# Patient Record
Sex: Female | Born: 1955 | Race: Black or African American | Hispanic: No | Marital: Married | Smoking: Never smoker
Health system: Southern US, Community
[De-identification: ages and names within clinical notes are randomized; demographics above are authoritative.]

## PROBLEM LIST (undated history)

## (undated) DIAGNOSIS — IMO0001 Reserved for inherently not codable concepts without codable children: Secondary | ICD-10-CM

## (undated) DIAGNOSIS — R51 Headache: Secondary | ICD-10-CM

## (undated) DIAGNOSIS — C801 Malignant (primary) neoplasm, unspecified: Secondary | ICD-10-CM

## (undated) DIAGNOSIS — Z923 Personal history of irradiation: Secondary | ICD-10-CM

## (undated) DIAGNOSIS — C7951 Secondary malignant neoplasm of bone: Secondary | ICD-10-CM

## (undated) DIAGNOSIS — R519 Headache, unspecified: Secondary | ICD-10-CM

## (undated) DIAGNOSIS — K5900 Constipation, unspecified: Secondary | ICD-10-CM

## (undated) DIAGNOSIS — C50919 Malignant neoplasm of unspecified site of unspecified female breast: Secondary | ICD-10-CM

## (undated) DIAGNOSIS — C419 Malignant neoplasm of bone and articular cartilage, unspecified: Secondary | ICD-10-CM

## (undated) DIAGNOSIS — Z9221 Personal history of antineoplastic chemotherapy: Secondary | ICD-10-CM

## (undated) HISTORY — PX: FINGER SURGERY: SHX640

---

## 1986-04-07 HISTORY — PX: ABDOMINAL HYSTERECTOMY: SHX81

## 2001-04-08 ENCOUNTER — Emergency Department (HOSPITAL_COMMUNITY): Admission: EM | Admit: 2001-04-08 | Discharge: 2001-04-08 | Payer: Self-pay | Admitting: *Deleted

## 2001-08-20 ENCOUNTER — Emergency Department (HOSPITAL_COMMUNITY): Admission: EM | Admit: 2001-08-20 | Discharge: 2001-08-20 | Payer: Self-pay | Admitting: Emergency Medicine

## 2005-12-05 ENCOUNTER — Emergency Department (HOSPITAL_COMMUNITY): Admission: EM | Admit: 2005-12-05 | Discharge: 2005-12-05 | Payer: Self-pay | Admitting: Emergency Medicine

## 2012-11-30 ENCOUNTER — Ambulatory Visit (INDEPENDENT_AMBULATORY_CARE_PROVIDER_SITE_OTHER): Payer: 59 | Admitting: Emergency Medicine

## 2012-11-30 VITALS — BP 122/78 | HR 64 | Temp 98.3°F | Resp 18 | Ht 65.0 in | Wt 198.8 lb

## 2012-11-30 DIAGNOSIS — M67919 Unspecified disorder of synovium and tendon, unspecified shoulder: Secondary | ICD-10-CM

## 2012-11-30 DIAGNOSIS — M7552 Bursitis of left shoulder: Secondary | ICD-10-CM

## 2012-11-30 MED ORDER — MELOXICAM 15 MG PO TABS: 15.0000 mg | ORAL_TABLET | Freq: Every day | ORAL | Status: DC

## 2012-11-30 NOTE — Progress Notes (Signed)
[facility] [address] [place] [address] [phone]  Date:  11/30/2012   Name:  Karen Douglas   DOB:  11-20-1955   MRN:  [id number]  PCP:  No primary provider on file.    Chief Complaint: Arm Pain   History of Present Illness:  Karen Douglas is a 57 y.o. very pleasant female patient who presents with the following:  No history of injury.  Has pain in anterior right shoulder since May.  Pain worse at night, unable to sleep on right side at night. No neuro symptoms or radiation of pain.  No improvement with over the counter medications or other home remedies. Denies other complaint or health concern today.   There are no active problems to display for this patient.   History reviewed. No pertinent past medical history.  Past Surgical History  Procedure Laterality Date  . Abdominal hysterectomy      History  Substance Use Topics  . Smoking status: Never Smoker   . Smokeless tobacco: Not on file  . Alcohol Use: No    History reviewed. No pertinent family history.  No Known Allergies  Medication list has been reviewed and updated.  No current outpatient prescriptions on file prior to visit.   No current facility-administered medications on file prior to visit.    Review of Systems:  As per HPI, otherwise negative.    Physical Examination: Filed Vitals:   11/30/12 0855  BP: 122/78  Pulse: 64  Temp: 98.3 F (36.8 C)  Resp: 18   Filed Vitals:   11/30/12 0855  Height: 5\' 5"  (1.651 m)  Weight: 198 lb 12.8 oz (90.175 kg)   Body mass index is 33.08 kg/(m^2). Ideal Body Weight: Weight in (lb) to have BMI = 25: 149.9   GEN: WDWN, NAD, Non-toxic, Alert & Oriented x 3 HEENT: Atraumatic, Normocephalic.  Ears and Nose: No external deformity. EXTR: No clubbing/cyanosis/edema NEURO: Normal gait.  PSYCH: Normally interactive. Conversant. Not depressed or anxious appearing.  Calm demeanor.  RIGHT shoulder:  Tender anterior  shoulder.  Full ROM NATI  Assessment and Plan: Shoulder bursitis mobic Declined xray  Signed,  Phillips Odor, MD

## 2013-01-26 ENCOUNTER — Emergency Department (HOSPITAL_COMMUNITY)
Admission: EM | Admit: 2013-01-26 | Discharge: 2013-01-26 | Disposition: A | Discharge status: Home / Self Care | Payer: 59 | Attending: Emergency Medicine | Admitting: Emergency Medicine

## 2013-01-26 ENCOUNTER — Emergency Department (HOSPITAL_COMMUNITY): Payer: 59

## 2013-01-26 ENCOUNTER — Encounter (HOSPITAL_COMMUNITY): Payer: Self-pay | Admitting: Emergency Medicine

## 2013-01-26 DIAGNOSIS — K029 Dental caries, unspecified: Secondary | ICD-10-CM | POA: Insufficient documentation

## 2013-01-26 DIAGNOSIS — R112 Nausea with vomiting, unspecified: Secondary | ICD-10-CM | POA: Insufficient documentation

## 2013-01-26 DIAGNOSIS — K0889 Other specified disorders of teeth and supporting structures: Secondary | ICD-10-CM

## 2013-01-26 DIAGNOSIS — K089 Disorder of teeth and supporting structures, unspecified: Secondary | ICD-10-CM | POA: Insufficient documentation

## 2013-01-26 DIAGNOSIS — R51 Headache: Secondary | ICD-10-CM | POA: Insufficient documentation

## 2013-01-26 HISTORY — DX: Headache: R51

## 2013-01-26 HISTORY — DX: Headache, unspecified: R51.9

## 2013-01-26 MED ORDER — DIPHENHYDRAMINE HCL 50 MG/ML IJ SOLN
25.0000 mg | Freq: Once | INTRAMUSCULAR | Status: AC
  Administered 2013-01-26: 25 mg via INTRAVENOUS
  Filled 2013-01-26: qty 1

## 2013-01-26 MED ORDER — METOCLOPRAMIDE HCL 5 MG/ML IJ SOLN
10.0000 mg | Freq: Once | INTRAMUSCULAR | Status: AC
  Administered 2013-01-26: 10 mg via INTRAVENOUS
  Filled 2013-01-26: qty 2

## 2013-01-26 MED ORDER — DEXAMETHASONE SODIUM PHOSPHATE 10 MG/ML IJ SOLN
10.0000 mg | Freq: Once | INTRAMUSCULAR | Status: AC
  Administered 2013-01-26: 10 mg via INTRAVENOUS
  Filled 2013-01-26: qty 1

## 2013-01-26 MED ORDER — SODIUM CHLORIDE 0.9 % IV BOLUS (SEPSIS)
1000.0000 mL | Freq: Once | INTRAVENOUS | Status: AC
  Administered 2013-01-26: 1000 mL via INTRAVENOUS

## 2013-01-26 NOTE — ED Provider Notes (Signed)
CSN: [id number]     Arrival date & time 01/26/13  1921 History   First MD Initiated Contact with Patient 01/26/13 2054     Chief Complaint  Patient presents with  . Headache   (Consider location/radiation/quality/duration/timing/severity/associated sxs/prior Treatment) Patient is a 57 y.o. female presenting with headaches. The history is provided by the patient. No language interpreter was used.  Headache Pain location:  Frontal Quality:  Dull Radiates to: L jaw. Pain severity now: moderate. Pain scale at highest: severe. Onset quality:  Gradual Duration:  2 days Timing:  Intermittent Progression:  Waxing and waning Chronicity:  New Similar to prior headaches: yes (years ago had migraines)   Relieved by:  Nothing Worsened by:  Light Ineffective treatments:  NSAIDs Associated symptoms: facial pain, nausea and vomiting   Associated symptoms: no abdominal pain, no back pain, no congestion, no cough, no diarrhea, no dizziness, no fatigue, no fever, no focal weakness, no loss of balance, no neck pain, no neck stiffness, no numbness, no photophobia and no sore throat   Vomiting:    Quality:  Stomach contents   Number of occurrences:  2   Duration:  1 day   Progression:  Resolved   Past Medical History  Diagnosis Date  . Headache    Past Surgical History  Procedure Laterality Date  . Abdominal hysterectomy     History reviewed. No pertinent family history. History  Substance Use Topics  . Smoking status: Never Smoker   . Smokeless tobacco: Not on file  . Alcohol Use: No   OB History   Grav Para Term Preterm Abortions TAB SAB Ect Mult Living                 Review of Systems  Constitutional: Negative for fever, chills, diaphoresis, activity change, appetite change and fatigue.  HENT: Negative for congestion, facial swelling, rhinorrhea and sore throat.   Eyes: Negative for photophobia and discharge.  Respiratory: Negative for cough, chest tightness and shortness of  breath.   Cardiovascular: Negative for chest pain, palpitations and leg swelling.  Gastrointestinal: Positive for nausea and vomiting. Negative for abdominal pain and diarrhea.  Endocrine: Negative for polydipsia and polyuria.  Genitourinary: Negative for dysuria, frequency, difficulty urinating and pelvic pain.  Musculoskeletal: Negative for arthralgias, back pain, neck pain and neck stiffness.  Skin: Negative for color change and wound.  Allergic/Immunologic: Negative for immunocompromised state.  Neurological: Positive for headaches. Negative for dizziness, focal weakness, facial asymmetry, weakness, numbness and loss of balance.  Hematological: Does not bruise/bleed easily.  Psychiatric/Behavioral: Negative for confusion and agitation.    Allergies  Review of patient's allergies indicates no known allergies.  Home Medications   Current Outpatient Rx  Name  Route  Sig  Dispense  Refill  . amoxicillin (AMOXIL) 500 MG capsule   Oral   Take 500 mg by mouth 4 (four) times daily. For 7 days. Pt started on 01-25-13         . HYDROcodone-acetaminophen (NORCO/VICODIN) 5-325 MG per tablet   Oral   Take 1 tablet by mouth every 6 (six) hours as needed for pain.          BP 151/91  Pulse 87  Temp(Src) 97.5 F (36.4 C) (Oral)  Resp 18  Ht 5' (1.524 m)  Wt 195 lb 12.3 oz (88.8 kg)  BMI 38.23 kg/m2  SpO2 97% Physical Exam  Constitutional: She is oriented to person, place, and time. She appears well-developed and well-nourished. No distress.  HENT:  Head: Normocephalic and atraumatic.  Mouth/Throat: Abnormal dentition. Dental caries present. No oropharyngeal exudate.  Eyes: Pupils are equal, round, and reactive to light.  Neck: Normal range of motion. Neck supple.  Cardiovascular: Normal rate, regular rhythm and normal heart sounds.  Exam reveals no gallop and no friction rub.   No murmur heard. Pulmonary/Chest: Effort normal and breath sounds normal. No respiratory distress.  She has no wheezes. She has no rales.  Abdominal: Soft. Bowel sounds are normal. She exhibits no distension and no mass. There is no tenderness. There is no rebound and no guarding.  Musculoskeletal: Normal range of motion. She exhibits no edema and no tenderness.  Neurological: She is alert and oriented to person, place, and time. She has normal strength. She displays no tremor. No cranial nerve deficit or sensory deficit. She exhibits normal muscle tone. She displays a negative Romberg sign. Coordination normal. GCS eye subscore is 4. GCS verbal subscore is 5. GCS motor subscore is 6.  Skin: Skin is warm and dry.  Psychiatric: She has a normal mood and affect.    ED Course  Procedures (including critical care time) Labs Review Labs Reviewed - No data to display Imaging Review No results found.  EKG Interpretation   None       MDM  No diagnosis found. Pt is a 57 y.o. female with Pmhx as above who presents with several days of intermittent h/a along w/ waxing waning dental pain, today w/ several episodes of vomiting.  Pt being treated for dental abscess, is on amox.  VSS, pt in NAD. No focal neuro findings. Poor dentition. No focal neuro findings, no fever.  CT head unremarkable.  Pt felt somewhat improved after IV migraine cocktail.  I believe she is safe for d/c, dental pain likely driving factor for h/a's.  She will continue PO amox and follow closely w/ her dentist.  Return precautions given for new or worsening symptoms including worsening pain, fever         Shanna Cisco, MD 01/27/13 1204

## 2013-01-26 NOTE — ED Notes (Signed)
Presents with gradual onset of frontal lobe headache and pain behind eyes, associated with light sensitiviy and nausea. Began at 4 pm. Pt alert, oriented, answers all questions appropriately. Pt is concerned her tooth that is infected is in her blood stream.  Received antibiotics, amoxicillin for toothache and pain medication yesterday. History of headaches, this feels same. Denies dental pain. Denies fevers

## 2013-08-21 ENCOUNTER — Ambulatory Visit (INDEPENDENT_AMBULATORY_CARE_PROVIDER_SITE_OTHER): Payer: 59 | Admitting: Family Medicine

## 2013-08-21 ENCOUNTER — Ambulatory Visit: Payer: 59

## 2013-08-21 VITALS — BP 118/64 | HR 74 | Temp 97.5°F | Resp 15 | Ht 61.5 in | Wt 190.2 lb

## 2013-08-21 DIAGNOSIS — M7581 Other shoulder lesions, right shoulder: Principal | ICD-10-CM

## 2013-08-21 DIAGNOSIS — M542 Cervicalgia: Secondary | ICD-10-CM

## 2013-08-21 DIAGNOSIS — M719 Bursopathy, unspecified: Secondary | ICD-10-CM

## 2013-08-21 DIAGNOSIS — M778 Other enthesopathies, not elsewhere classified: Secondary | ICD-10-CM

## 2013-08-21 DIAGNOSIS — M67919 Unspecified disorder of synovium and tendon, unspecified shoulder: Secondary | ICD-10-CM

## 2013-08-21 MED ORDER — MELOXICAM 7.5 MG PO TABS: 7.5000 mg | ORAL_TABLET | Freq: Every day | ORAL | Status: DC

## 2013-08-21 MED ORDER — METHOCARBAMOL 500 MG PO TABS: 500.0000 mg | ORAL_TABLET | Freq: Three times a day (TID) | ORAL | Status: DC | PRN

## 2013-08-21 NOTE — Progress Notes (Signed)
[facility] [address] [place] [address] [phone]  Date:  08/21/2013   Name:  Karen Douglas   DOB:  Jul 08, 1955   MRN:  [id number]  PCP:  No PCP Per Patient    Chief Complaint: Neck Pain and Arm Pain   History of Present Illness:  Karen Douglas is a 58 y.o. very pleasant female patient who presents with the following:  Seen here with right shoulder pain in August of 2014.  Treated with mobic.  She is here today with pain in her left neck for 2 or 3 months.  She has tried ice, heat, OTC pain medications.  "it's not a hurt- hurt all day long hurt."  However she may notice more pain if she moves her neck a certain way. She also has pain in her right arm- she states this is the same shoulder pain we saw her for last year.  She notes the pain in her arm if she reaches forward or lifts with her arm flexed.  She works at a day care.  Notes that her ROM is no longer full in the right shoulder  She is not aware of any injury, no fall.    She is otherwise generally healthy   There are no active problems to display for this patient.   Past Medical History  Diagnosis Date  . Headache     Past Surgical History  Procedure Laterality Date  . Abdominal hysterectomy      History  Substance Use Topics  . Smoking status: Never Smoker   . Smokeless tobacco: Not on file  . Alcohol Use: Yes     Comment: occasionally    History reviewed. No pertinent family history.  No Known Allergies  Medication list has been reviewed and updated.  Current Outpatient Prescriptions on File Prior to Visit  Medication Sig Dispense Refill  . amoxicillin (AMOXIL) 500 MG capsule Take 500 mg by mouth 4 (four) times daily. For 7 days. Pt started on 01-25-13      . HYDROcodone-acetaminophen (NORCO/VICODIN) 5-325 MG per tablet Take 1 tablet by mouth every 6 (six) hours as needed for pain.       No current facility-administered medications on file prior to visit.     Review of Systems:  As per HPI- otherwise negative.   Physical Examination: Filed Vitals:   08/21/13 0858  BP: 118/64  Pulse: 74  Temp: 97.5 F (36.4 C)  Resp: 15   Filed Vitals:   08/21/13 0858  Height: 5' 1.5" (1.562 m)  Weight: 190 lb 3.2 oz (86.274 kg)   Body mass index is 35.36 kg/(m^2). Ideal Body Weight: Weight in (lb) to have BMI = 25: 134.2  GEN: WDWN, NAD, Non-toxic, A & O x 3, obese, looks well.  Mild amblyopia HEENT: Atraumatic, Normocephalic. Neck supple. No masses, No LAD. Ears and Nose: No external deformity. CV: RRR, No M/G/R. No JVD. No thrill. No extra heart sounds. PULM: CTA B, no wheezes, crackles, rhonchi. No retractions. No resp. distress. No accessory muscle use. ABD: S, NT, ND, +BS. No rebound. No HSM. EXTR: No c/c/e.  Missing part of left thumb NEURO Normal gait.  PSYCH: Normally interactive. Conversant. Not depressed or anxious appearing.  Calm demeanor.  Right shoulder: she does not have full flexion or abduction.   Normal internal and external rotation Mild tenderenss left trapezius muscle  [facility] reading (PRIMARY) by  Dr. Lorelei Pont. Cervical spine: mild degenerative change, normal foramina  Right shoulder: minimal degenerative change  CERVICAL SPINE 4+ VIEWS  COMPARISON: None.  FINDINGS: Frontal, lateral, open-mouth odontoid, and bilateral oblique views were obtained. There is no fracture or spondylolisthesis. Prevertebral soft tissues and predental space regions are normal. There is moderate disc space narrowing at all levels. There are anterior osteophytes at all levels except for C2. There is no significant exit foraminal narrowing on the oblique views. No erosive change.  IMPRESSION: Multilevel osteoarthritic change. No fracture or spondylolisthesis.   RIGHT SHOULDER - 2+ VIEW  COMPARISON: None.  FINDINGS: Frontal, Y scapular, and axillary images were obtained. There is no fracture or dislocation. There is slight narrowing  of the glenohumeral joint. No erosive change or intra-articular calcification.  IMPRESSION: Slight narrowing glenohumeral joint. No fracture or dislocation.  Assessment and Plan: Right shoulder tendonitis - Plan: DG Shoulder Right, meloxicam (MOBIC) 7.5 MG tablet, methocarbamol (ROBAXIN) 500 MG tablet, Ambulatory referral to Physical Therapy  Sore neck - Plan: DG Cervical Spine Complete  Edilia has reduced ROM in the right shoulder, likely due to longer term RCT.  Referral to PT for further treatment, and mobic as needed Neck soreness is MSK likely, may be due to abnormal body mechanics due to shoulder issue.  Treat with prn robaxin  Signed Lamar Blinks, MD

## 2013-08-21 NOTE — Patient Instructions (Signed)
Please use the mobic once a day as needed for pain- this will not make you feel drowsy The robaxin is a muscle relaxer- you can also use this as needed, but it CAN make you feel sleepy Avoid lifting overhead We will get you set up with physical therapy to work on your shoulder

## 2013-09-12 ENCOUNTER — Ambulatory Visit: Payer: 59 | Admitting: Physical Therapy

## 2013-09-13 ENCOUNTER — Ambulatory Visit: Payer: 59

## 2013-10-10 ENCOUNTER — Ambulatory Visit: Payer: 59 | Attending: Family Medicine | Admitting: Physical Therapy

## 2013-11-28 DIAGNOSIS — C50919 Malignant neoplasm of unspecified site of unspecified female breast: Secondary | ICD-10-CM

## 2013-11-28 HISTORY — DX: Malignant neoplasm of unspecified site of unspecified female breast: C50.919

## 2013-11-29 ENCOUNTER — Other Ambulatory Visit: Payer: Self-pay | Admitting: Radiology

## 2013-11-29 DIAGNOSIS — C50911 Malignant neoplasm of unspecified site of right female breast: Secondary | ICD-10-CM

## 2013-12-01 ENCOUNTER — Telehealth: Payer: Self-pay | Admitting: *Deleted

## 2013-12-01 DIAGNOSIS — C50511 Malignant neoplasm of 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: Secondary | ICD-10-CM | POA: Insufficient documentation

## 2013-12-01 NOTE — Telephone Encounter (Signed)
Confirmed [facility] for 12/07/13 at 1200.  Instructions and contact information given.

## 2013-12-06 ENCOUNTER — Ambulatory Visit
Admission: RE | Admit: 2013-12-06 | Discharge: 2013-12-06 | Disposition: A | Discharge status: Home / Self Care | Payer: 59 | Source: Ambulatory Visit | Attending: Radiology | Admitting: Radiology

## 2013-12-06 DIAGNOSIS — C50911 Malignant neoplasm of unspecified site of right female breast: Secondary | ICD-10-CM

## 2013-12-06 MED ORDER — GADOBENATE DIMEGLUMINE 529 MG/ML IV SOLN
18.0000 mL | Freq: Once | INTRAVENOUS | Status: AC | PRN
  Administered 2013-12-06: 18 mL via INTRAVENOUS

## 2013-12-07 ENCOUNTER — Other Ambulatory Visit (HOSPITAL_BASED_OUTPATIENT_CLINIC_OR_DEPARTMENT_OTHER): Payer: 59

## 2013-12-07 ENCOUNTER — Encounter: Payer: Self-pay | Admitting: Hematology and Oncology

## 2013-12-07 ENCOUNTER — Ambulatory Visit (HOSPITAL_BASED_OUTPATIENT_CLINIC_OR_DEPARTMENT_OTHER): Payer: 59

## 2013-12-07 ENCOUNTER — Telehealth: Payer: Self-pay | Admitting: Hematology and Oncology

## 2013-12-07 ENCOUNTER — Ambulatory Visit (HOSPITAL_BASED_OUTPATIENT_CLINIC_OR_DEPARTMENT_OTHER): Payer: 59 | Admitting: Hematology and Oncology

## 2013-12-07 ENCOUNTER — Ambulatory Visit: Payer: 59 | Attending: Family Medicine | Admitting: Physical Therapy

## 2013-12-07 ENCOUNTER — Ambulatory Visit: Payer: 59 | Admitting: Physical Therapy

## 2013-12-07 ENCOUNTER — Ambulatory Visit
Admission: RE | Admit: 2013-12-07 | Discharge: 2013-12-07 | Disposition: A | Discharge status: Home / Self Care | Payer: 59 | Source: Ambulatory Visit | Attending: Radiation Oncology | Admitting: Radiation Oncology

## 2013-12-07 ENCOUNTER — Other Ambulatory Visit (INDEPENDENT_AMBULATORY_CARE_PROVIDER_SITE_OTHER): Payer: Self-pay | Admitting: General Surgery

## 2013-12-07 VITALS — BP 131/73 | HR 87 | Temp 97.6°F | Resp 18 | Ht 61.5 in | Wt 195.1 lb

## 2013-12-07 DIAGNOSIS — C50919 Malignant neoplasm of unspecified site of unspecified female breast: Secondary | ICD-10-CM | POA: Diagnosis not present

## 2013-12-07 DIAGNOSIS — Z170 Estrogen receptor positive status [ER+]: Secondary | ICD-10-CM

## 2013-12-07 DIAGNOSIS — IMO0001 Reserved for inherently not codable concepts without codable children: Secondary | ICD-10-CM | POA: Diagnosis not present

## 2013-12-07 DIAGNOSIS — M25619 Stiffness of unspecified shoulder, not elsewhere classified: Secondary | ICD-10-CM | POA: Insufficient documentation

## 2013-12-07 DIAGNOSIS — C50511 Malignant neoplasm of 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: Secondary | ICD-10-CM

## 2013-12-07 DIAGNOSIS — R293 Abnormal posture: Secondary | ICD-10-CM | POA: Diagnosis not present

## 2013-12-07 DIAGNOSIS — C50519 Malignant neoplasm of lower-outer quadrant of unspecified female breast: Secondary | ICD-10-CM

## 2013-12-07 LAB — CBC WITH DIFFERENTIAL/PLATELET
BASO%: 0.6 % (ref 0.0–2.0)
BASOS ABS: 0 10*3/uL (ref 0.0–0.1)
EOS%: 1 % (ref 0.0–7.0)
Eosinophils Absolute: 0 10*3/uL (ref 0.0–0.5)
HCT: 39.3 % (ref 34.8–46.6)
HGB: 12.5 g/dL (ref 11.6–15.9)
LYMPH%: 39.7 % (ref 14.0–49.7)
MCH: 28 pg (ref 25.1–34.0)
MCHC: 31.8 g/dL (ref 31.5–36.0)
MCV: 87.9 fL (ref 79.5–101.0)
MONO#: 0.4 10*3/uL (ref 0.1–0.9)
MONO%: 9.7 % (ref 0.0–14.0)
NEUT#: 2.1 10*3/uL (ref 1.5–6.5)
NEUT%: 49 % (ref 38.4–76.8)
Platelets: 274 10*3/uL (ref 145–400)
RBC: 4.47 10*6/uL (ref 3.70–5.45)
RDW: 15.3 % — AB (ref 11.2–14.5)
WBC: 4.3 10*3/uL (ref 3.9–10.3)
lymph#: 1.7 10*3/uL (ref 0.9–3.3)

## 2013-12-07 LAB — COMPREHENSIVE METABOLIC PANEL (CC13)
ALT: 12 U/L (ref 0–55)
AST: 14 U/L (ref 5–34)
Albumin: 3.7 g/dL (ref 3.5–5.0)
Alkaline Phosphatase: 73 U/L (ref 40–150)
Anion Gap: 9 mEq/L (ref 3–11)
BUN: 12.4 mg/dL (ref 7.0–26.0)
CO2: 24 mEq/L (ref 22–29)
Calcium: 9.3 mg/dL (ref 8.4–10.4)
Chloride: 109 mEq/L (ref 98–109)
Creatinine: 0.9 mg/dL (ref 0.6–1.1)
Glucose: 90 mg/dl (ref 70–140)
Potassium: 3.9 mEq/L (ref 3.5–5.1)
Sodium: 143 mEq/L (ref 136–145)
Total Bilirubin: 0.41 mg/dL (ref 0.20–1.20)
Total Protein: 7.7 g/dL (ref 6.4–8.3)

## 2013-12-07 NOTE — Progress Notes (Signed)
[place] NOTE  Patient Care Team: Iona Beard, MD as PCP - General (Family Medicine) Autumn Messing III, MD as Consulting Physician (General Surgery) Rulon Eisenmenger, MD as Consulting Physician (Hematology and Oncology) Rexene Edison, MD as Consulting Physician (Radiation Oncology)  CHIEF COMPLAINTS/PURPOSE OF CONSULTATION:  Newly diagnosed breast cancer  HISTORY OF PRESENTING ILLNESS:  Karen Douglas 58 y.o. African American female is here because of recent diagnosis of right breast cancer. Patient noticed a thickening of the right breast for a while and then she started to feel a lump in the breast there were some abnormal sensations in the right axilla that led to her coming for evaluation she had a mammogram and ultrasound followed by MRI of the breast. The mammogram and ultrasound revealed multiple nodules throughout the breast 6:00 position 2.3 cm, 9:00 position 2.5 cm the retroareolar area 3.1 cm along with an axillary lymph nodes were also noted. 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 By MRI the size of this mass is 11.5 cm. Clinical stage was T4 N1 MX-stage IIIB. The axillary lymph node was also biopsied and proven to be breast cancer. She is yesterday to the multidisciplinary breast clinic to discuss the treatment plan. Her major complaint is the discomfort in the right breast extending into the axilla sometimes it can be a sharp pain sometimes feels like a burning sensation.   I reviewed her records extensively and collaborated the history with the patient.  SUMMARY OF ONCOLOGIC HISTORY:   Breast cancer of 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   12/01/2013 Initial Diagnosis Breast cancer of 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   12/06/2013 Breast MRI Enhancing tumor throughout the right breast all quadrants with diffuse skin thickening 11.5 x 11 x 5 cm:  Spiculated mass posterior UOQ right breast 2.6 x 2.5 x 2 cm and abuts pectoralis muscle: 2 abnormal axillary lymph nodes largest 2.2 cm    In terms of breast cancer risk profile:  She menarched at early age of 19 and went to menopause at age 70  She had 2 pregnancy, her first child was born at age 26  She did not received birth control pills for approximately 0.  She was never exposed to fertility medications or hormone replacement therapy.  She has no family history of Breast/GYN/GI cancer  MEDICAL HISTORY:  Past Medical History  Diagnosis Date  . Headache     SURGICAL HISTORY: Past Surgical History  Procedure Laterality Date  . Abdominal hysterectomy      SOCIAL HISTORY: History   Social History  . Marital Status: Married    Spouse Name: N/A    Number of Children: N/A  . Years of Education: N/A   Occupational History  . Not on file.   Social History Main Topics  . Smoking status: Never Smoker   . Smokeless tobacco: Not on file  . Alcohol Use: Yes     Comment: occasionally  . Drug Use: No  . Sexual Activity: Not on file   Other Topics Concern  . Not on file   Social History Narrative  . No narrative on file    FAMILY HISTORY: History reviewed. No pertinent family history.  ALLERGIES:  has No Known Allergies.  MEDICATIONS:  Current Outpatient Prescriptions  Medication Sig Dispense Refill  . atorvastatin (LIPITOR) 20 MG tablet Take 20 mg by mouth daily.      Marland Kitchen  meloxicam (MOBIC) 7.5 MG tablet Take 1 tablet (7.5 mg total) by mouth daily. As needed for pain  30 tablet  2  . methocarbamol (ROBAXIN) 500 MG tablet Take 1 tablet (500 mg total) by mouth every 8 (eight) hours as needed for muscle spasms.  30 tablet  0   No current facility-administered medications for this visit.    REVIEW OF SYSTEMS:   Constitutional: Denies fevers, chills or abnormal night sweats Eyes: Denies blurriness of vision, double vision or watery eyes Ears, nose, mouth, throat, and  face: Denies mucositis or sore throat Respiratory: Denies cough, dyspnea or wheezes Cardiovascular: Denies palpitation, chest discomfort or lower extremity swelling Gastrointestinal:  Denies nausea, heartburn or change in bowel habits Skin: Denies abnormal skin rashes Lymphatics: Denies new lymphadenopathy or easy bruising Neurological:Denies numbness, tingling or new weaknesses Behavioral/Psych: Mood is stable, no new changes  Breast: Large nodule in the right breast with thickening of the skin extending into the axilla All other systems were reviewed with the patient and are negative.  PHYSICAL EXAMINATION: ECOG PERFORMANCE STATUS: 0 - Asymptomatic  Filed Vitals:   12/07/13 1253  BP: 131/73  Pulse: 87  Temp: 97.6 F (36.4 C)  Resp: 18   Filed Weights   12/07/13 1253  Weight: 195 lb 1.6 oz (88.497 kg)    GENERAL:alert, no distress and comfortable SKIN: skin color, texture, turgor are normal, no rashes or significant lesions EYES: normal, conjunctiva are pink and non-injected, sclera clear OROPHARYNX:no exudate, no erythema and lips, buccal mucosa, and tongue normal  NECK: supple, thyroid normal size, non-tender, without nodularity LYMPH:  no palpable lymphadenopathy in the cervical, axillary or inguinal LUNGS: clear to auscultation and percussion with normal breathing effort HEART: regular rate & rhythm and no murmurs and no lower extremity edema ABDOMEN:abdomen soft, non-tender and normal bowel sounds Musculoskeletal:no cyanosis of digits and no clubbing  PSYCH: alert & oriented x 3 with fluent speech NEURO: no focal motor/sensory deficits BREAST: Extremely large mass in the right breast that is at least 10-11 cm by physical exam with associated [place] changes. No palpable lymphadenopathy in the axilla. Palpation of the left breast did not have any abnormalities  LABORATORY DATA:  I have reviewed the data as listed Lab Results  Component Value Date   WBC 4.3  12/07/2013   HGB 12.5 12/07/2013   HCT 39.3 12/07/2013   MCV 87.9 12/07/2013   PLT 274 12/07/2013   Lab Results  Component Value Date   NA 143 12/07/2013   K 3.9 12/07/2013   CO2 24 12/07/2013    RADIOGRAPHIC STUDIES: I have personally reviewed the radiological reports and agreed with the findings in the report. 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 After reviewing the pathology in detail, we proceeded to discuss the different treatment options between surgery, radiation, chemotherapy, antiestrogen therapies.  Based on the multidisciplinary breast tumor board discussion  we recommend neoadjuvant chemotherapy with Taxotere Adriamycin and Cytoxan given by IV once every 3 weeks for 6 doses this would be followed by Neulasta injection. I counseled her extensively about chemotherapy risks and benefits including the risk of hair loss, nausea, loss of taste, infection, anemia and tiredness, Adriamycin-related decreased heart function, neuropathy with tingling and numbness of hands and feet related to Taxotere, permanent bone marrow damage and leukemia gross.  I will schedule the patient for a port placement, echocardiogram of the heart chemotherapy class and a PET/CT complete the staging process. I hope to start chemotherapy in the next 2 weeks with TAC   All questions were  answered. The patient knows to call the clinic with any problems, questions or concerns. I spent 55 minutes counseling the patient face to face. The total time spent in the appointment was 60 minutes and more than 50% was on counseling.     Rulon Eisenmenger, MD 12/07/2013 3:54 PM

## 2013-12-07 NOTE — Telephone Encounter (Signed)
, °

## 2013-12-07 NOTE — Assessment & Plan Note (Signed)
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 After reviewing the pathology in detail, we proceeded to discuss the different treatment options between surgery, radiation, chemotherapy, antiestrogen therapies.  Based on the multidisciplinary breast tumor board discussion we recommend neoadjuvant chemotherapy with Taxotere Adriamycin and Cytoxan given by IV once every 3 weeks for 6 doses this would be followed by Neulasta injection. I counseled her extensively about chemotherapy risks and benefits including the risk of hair loss, nausea, loss of taste, infection, anemia and tiredness, Adriamycin-related decreased heart function, neuropathy with tingling and numbness of hands and feet related to Taxotere, permanent bone marrow damage and leukemia gross.  I will schedule the patient for a port placement, echocardiogram of the heart chemotherapy class and a PET/CT complete the staging process. I hope to start chemotherapy in the next 2 weeks with TAC

## 2013-12-07 NOTE — Progress Notes (Signed)
Office note created by MD - copy to patient, original to scan.

## 2013-12-07 NOTE — Progress Notes (Signed)
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 female who is seen today at the [facility] through the courtesy Dr. [name] for evaluation of her clinical stage T4b N1 MX inflammatory invasive ductal carcinoma of the right breast. She states that she first noted right breast discomfort and tingling approximately 2 months ago. The pain radiated to her right "armpit". She underwent mammography at [facility] on 11/26/2013. She was noted to have an irregular mass with a spiculated margin of the right breast along with axillary adenopathy. Additional views and ultrasound showed a 2.3 cm lobulated mass in the right breast at 6:00, a 3.1 cm lobulated mass in the right breast central to the nipple, a 1.3 cm irregular mass in the right axillary tail and 2.5 cm mass in the right breast at 9:00 all suspicious for malignancy. Biopsies on 11/28/2013 were diagnostic for invasive mammary carcinoma. The carcinoma was ER positive, PR positive with a proliferation marker of 96%. The tumor was HER-2/neu negative. Breast MR on 12/06/2013 showed enhancing tumor masses throughout the right breast involving all 4 quadrants with diffuse skin thickening and dermal enhancement. At least 2 abnormal level I right axillary lymph nodes were seen consistent with metastatic adenopathy. She is not had a PET or CT scan.  PREVIOUS RADIATION THERAPY: No   PAST MEDICAL HISTORY:  has a past medical history of Headache.     PAST SURGICAL HISTORY:  Past Surgical History  Procedure Laterality Date  . Abdominal hysterectomy       FAMILY HISTORY: family history is not on file. Her father  died from TB at age 18. Her mother died from complications of diabetes mellitus at 42.   SOCIAL HISTORY:  reports that she has never smoked. She does not have any smokeless tobacco history on file. She reports that she drinks alcohol. She reports that she does not use illicit drugs. Married, 2 children. She works at a daycare.   ALLERGIES: Review of patient's allergies indicates no known allergies.   MEDICATIONS:  Current Outpatient Prescriptions  Medication Sig Dispense Refill  . atorvastatin (LIPITOR) 20 MG tablet Take 20 mg by mouth daily.      . meloxicam (MOBIC) 7.5 MG tablet Take 1 tablet (7.5 mg total) by mouth daily. As needed for pain  30 tablet  2  . methocarbamol (ROBAXIN) 500 MG tablet Take 1 tablet (500 mg total) by mouth every 8 (eight) hours as needed for muscle spasms.  30 tablet  0   No current facility-administered medications for this encounter.     REVIEW OF SYSTEMS:  Pertinent items are noted in HPI.    PHYSICAL EXAM: Alert and oriented 58 year old African American female appearing her stated age. Wt Readings from Last 3 Encounters:  12/07/13 195 lb 1.6 oz (88.497 kg)  08/21/13 190 lb 3.2 oz (86.274 kg)  01/26/13 195 lb 12.3 oz (88.8 kg)   Temp Readings from Last 3 Encounters:  12/07/13 97.6 F (36.4 C) Oral  08/21/13 97.5 F (36.4 C) Oral  01/26/13 98.6 F (37 C) Oral  BP Readings from Last 3 Encounters:  12/07/13 131/73  08/21/13 118/64  01/26/13 110/70   Pulse Readings from Last 3 Encounters:  12/07/13 87  08/21/13 74  01/26/13 70   Head and neck examination: Grossly unremarkable. Nodes: There are at least 2 palpable right axillary nodes measuring approximately 1 cm in greatest diameter. Chest: Lungs clear. Breasts: There is a large mass with the epicenter along the right areola the overlying skin hyperpigmentation/erythema and edema consistent with inflammatory breast cancer. This mass is at least 8 cm in greatest diameter. Left breast without  masses or lesions. Abdomen without hepatomegaly. Extremities: Without edema.    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 She should have a PET or at least a CT scan to complete her staging workup. We think she would best served by neoadjuvant chemotherapy followed by modified radical mastectomy and then post mastectomy radiation therapy. We discussed the potential acute and late toxicities of radiation therapy. I can see her for review after her definitive surgery. Following radiation therapy she will be a candidate for antiestrogen therapy.   PLAN: As discussed above  I spent 30 minutes minutes face to face with the patient and more than 50% of that time was spent in counseling and/or coordination of care.

## 2013-12-07 NOTE — Progress Notes (Signed)
Checked in new patient with no financial issues prior to seeing the dr. She has appt card and her [facility] packet-but she had not filled it out. She will call me if interested in [employer]. She has my card and has not been out of the country.

## 2013-12-08 ENCOUNTER — Telehealth: Payer: Self-pay | Admitting: Hematology and Oncology

## 2013-12-08 ENCOUNTER — Other Ambulatory Visit: Payer: Self-pay

## 2013-12-08 DIAGNOSIS — C50511 Malignant neoplasm of 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: Secondary | ICD-10-CM

## 2013-12-08 NOTE — Telephone Encounter (Signed)
, °

## 2013-12-09 ENCOUNTER — Encounter: Payer: Self-pay | Admitting: General Practice

## 2013-12-09 NOTE — Progress Notes (Signed)
[place] Psychosocial Distress Screening Spiritual Care  Chaplain and Counseling Intern visited with Ms Dubow and dtr during breast clinic to introduce our disciplines and [facility] resources.  Also reviewed distress screen per protocol.  The patient scored a 8 on the Psychosocial Distress Thermometer which indicates severe distress. Chaplain and Counseling intern assessed for distress and other psychosocial needs.   ONCBCN DISTRESS SCREENING 12/09/2013  Screening Type Initial Screening  Elta Guadel[place] the number that describes how much distress you have been experiencing in the past week 8  Practical problem type Insurance  Emotional problem type Nervousness/Anxiety;Adjusting to illness  Physical Problem type Pain;Talking;Tingling hands/feet  Referral to clinical social work Yes  Referral to support programs Yes  Other Spiritual Care, Counseling Interns   Ms Mitcheltree describes herself as "a lot more relaxed now that I know what needs to be done" and interprets her dx as "God's way of getting the family back together."  She is currently pursuing a bachelor's degree in early childnoodone course at a time, which is a source of meaning, pride, and motivation for her.  Another significant source of meaning is her and her husband's ritual (ca 3x/year) of fishing together at [employer].  It is very important to her that tx can still allow her to do this.  She is also looking forward to her Christmas dinner tradition, which is a sacred time of family gathering.  Support Center follow up needed: Yes.    If yes, follow up plan:  Counseling Intern Helen-Marie Humphrey plans to f/u for check-in and repeated offer of 1:1 and/or family counseling.  [place], [employer]

## 2013-12-13 ENCOUNTER — Other Ambulatory Visit: Payer: Self-pay | Admitting: *Deleted

## 2013-12-13 ENCOUNTER — Telehealth: Payer: Self-pay | Admitting: *Deleted

## 2013-12-13 DIAGNOSIS — C50511 Malignant neoplasm of 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: Secondary | ICD-10-CM

## 2013-12-13 NOTE — Telephone Encounter (Signed)
Spoke with patient from [facility] 12/07/13.  No questions or concerns at this time.  Encouraged her to call with any needs or concerns.

## 2013-12-14 ENCOUNTER — Encounter (HOSPITAL_COMMUNITY): Payer: Self-pay | Admitting: Pharmacy Technician

## 2013-12-15 ENCOUNTER — Other Ambulatory Visit: Payer: 59

## 2013-12-15 ENCOUNTER — Telehealth: Payer: Self-pay | Admitting: Hematology and Oncology

## 2013-12-15 ENCOUNTER — Ambulatory Visit (HOSPITAL_COMMUNITY)
Admission: RE | Admit: 2013-12-15 | Discharge: 2013-12-15 | Disposition: A | Discharge status: Home / Self Care | Payer: 59 | Source: Ambulatory Visit | Attending: Hematology and Oncology | Admitting: Hematology and Oncology

## 2013-12-15 DIAGNOSIS — Z01818 Encounter for other preprocedural examination: Secondary | ICD-10-CM | POA: Diagnosis not present

## 2013-12-15 DIAGNOSIS — I10 Essential (primary) hypertension: Secondary | ICD-10-CM | POA: Diagnosis not present

## 2013-12-15 DIAGNOSIS — C50511 Malignant neoplasm of 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: Secondary | ICD-10-CM

## 2013-12-15 DIAGNOSIS — C50519 Malignant neoplasm of lower-outer quadrant of unspecified female breast: Secondary | ICD-10-CM | POA: Insufficient documentation

## 2013-12-15 DIAGNOSIS — Z5111 Encounter for antineoplastic chemotherapy: Secondary | ICD-10-CM

## 2013-12-15 NOTE — Progress Notes (Signed)
  Echocardiogram 2D Echocardiogram has been performed.  Karen Douglas M 12/15/2013, 11:11 AM

## 2013-12-15 NOTE — Telephone Encounter (Signed)
lvm for pt regarding to SEpt appt. °

## 2013-12-16 ENCOUNTER — Encounter (HOSPITAL_COMMUNITY)
Admission: RE | Admit: 2013-12-16 | Discharge: 2013-12-16 | Disposition: A | Discharge status: Home / Self Care | Payer: 59 | Source: Ambulatory Visit | Attending: General Surgery | Admitting: General Surgery

## 2013-12-16 ENCOUNTER — Encounter (HOSPITAL_COMMUNITY): Payer: Self-pay

## 2013-12-16 ENCOUNTER — Encounter: Payer: Self-pay | Admitting: Hematology and Oncology

## 2013-12-16 DIAGNOSIS — Z90710 Acquired absence of both cervix and uterus: Secondary | ICD-10-CM | POA: Diagnosis not present

## 2013-12-16 DIAGNOSIS — E7800 Pure hypercholesterolemia, unspecified: Secondary | ICD-10-CM | POA: Diagnosis not present

## 2013-12-16 DIAGNOSIS — C50419 Malignant neoplasm of upper-outer quadrant of unspecified female breast: Secondary | ICD-10-CM | POA: Diagnosis present

## 2013-12-16 HISTORY — DX: Malignant (primary) neoplasm, unspecified: C80.1

## 2013-12-16 NOTE — Pre-Procedure Instructions (Signed)
Karen Douglas  12/16/2013   Your procedure is scheduled on:  Monday, September 14th  Report to [facility] Admitting at 530 AM.  Call this number if you have problems the morning of surgery: [phone]   Remember:   Do not eat food or drink liquids after midnight.   Take these medicines the morning of surgery with A SIP OF WATER: none   Do not wear jewelry, make-up or nail polish.  Do not wear lotions, powders, or perfumes. You may wear deodorant.  Do not shave 48 hours prior to surgery. Men may shave face and neck.  Do not bring valuables to the hospital.  [facility] is not responsible for any belongings or valuables.               Contacts, dentures or bridgework may not be worn into surgery.  Leave suitcase in the car. After surgery it may be brought to your room.  For patients admitted to the hospital, discharge time is determined by your treatment team.               Patients discharged the day of surgery will not be allowed to drive home.  Please read over the following fact sheets that you were given: Pain Booklet, Coughing and Deep Breathing and Surgical Site Infection Prevention [facility] - Preparing for Surgery  Before surgery, you can play an important role.  Because skin is not sterile, your skin needs to be as free of germs as possible.  You can reduce the number of germs on you skin by washing with CHG (chlorahexidine gluconate) soap before surgery.  CHG is an antiseptic cleaner which kills germs and bonds with the skin to continue killing germs even after washing.  Please DO NOT use if you have an allergy to CHG or antibacterial soaps.  If your skin becomes reddened/irritated stop using the CHG and inform your nurse when you arrive at Short Stay.  Do not shave (including legs and underarms) for at least 48 hours prior to the first CHG shower.  You may shave your face.  Please follow these instructions carefully:   1.  Shower with CHG Soap the night  before surgery and the morning of Surgery.  2.  If you choose to wash your hair, wash your hair first as usual with your normal shampoo.  3.  After you shampoo, rinse your hair and body thoroughly to remove the shampoo.  4.  Use CHG as you would any other liquid soap.  You can apply CHG directly to the skin and wash gently with scrungie or a clean washcloth.  5.  Apply the CHG Soap to your body ONLY FROM THE NECK DOWN.  Do not use on open wounds or open sores.  Avoid contact with your eyes, ears, mouth and genitals (private parts).  Wash genitals (private parts) with your normal soap.  6.  Wash thoroughly, paying special attention to the area where your surgery will be performed.  7.  Thoroughly rinse your body with warm water from the neck down.  8.  DO NOT shower/wash with your normal soap after using and rinsing off the CHG Soap.  9.  Pat yourself dry with a clean towel.            10.  Wear clean pajamas.            11.  Place clean sheets on your bed the night of your first shower and do  not sleep with pets.  Day of Surgery  Do not apply any lotions/deoderants the morning of surgery.  Please wear clean clothes to the hospital/surgery center.

## 2013-12-16 NOTE — Progress Notes (Signed)
Primary - dr. Lilli Light hill Does not have cardiologist Echo in epic

## 2013-12-16 NOTE — Progress Notes (Signed)
No treatment date as of today 12/16/13

## 2013-12-18 MED ORDER — CHLORHEXIDINE GLUCONATE 4 % EX LIQD
1.0000 "application " | Freq: Once | CUTANEOUS | Status: DC
  Filled 2013-12-18: qty 15

## 2013-12-18 MED ORDER — CEFAZOLIN SODIUM-DEXTROSE 2-3 GM-% IV SOLR
2.0000 g | INTRAVENOUS | Status: DC
  Filled 2013-12-18: qty 50

## 2013-12-19 ENCOUNTER — Encounter (HOSPITAL_COMMUNITY): Payer: Self-pay | Admitting: *Deleted

## 2013-12-19 ENCOUNTER — Ambulatory Visit (HOSPITAL_COMMUNITY): Payer: 59 | Admitting: Certified Registered Nurse Anesthetist

## 2013-12-19 ENCOUNTER — Encounter (HOSPITAL_COMMUNITY)
Admission: RE | Disposition: A | Discharge status: Home / Self Care | Payer: Self-pay | Source: Ambulatory Visit | Attending: General Surgery

## 2013-12-19 ENCOUNTER — Ambulatory Visit (HOSPITAL_COMMUNITY)
Admission: RE | Admit: 2013-12-19 | Discharge: 2013-12-19 | Disposition: A | Discharge status: Home / Self Care | Payer: 59 | Source: Ambulatory Visit | Attending: General Surgery | Admitting: General Surgery

## 2013-12-19 ENCOUNTER — Encounter (HOSPITAL_COMMUNITY): Payer: 59 | Admitting: Certified Registered Nurse Anesthetist

## 2013-12-19 ENCOUNTER — Ambulatory Visit (HOSPITAL_COMMUNITY): Payer: 59

## 2013-12-19 DIAGNOSIS — E7800 Pure hypercholesterolemia, unspecified: Secondary | ICD-10-CM | POA: Insufficient documentation

## 2013-12-19 DIAGNOSIS — C50511 Malignant neoplasm of 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: Secondary | ICD-10-CM

## 2013-12-19 DIAGNOSIS — Z90710 Acquired absence of both cervix and uterus: Secondary | ICD-10-CM | POA: Insufficient documentation

## 2013-12-19 DIAGNOSIS — C50419 Malignant neoplasm of upper-outer quadrant of unspecified female breast: Secondary | ICD-10-CM | POA: Insufficient documentation

## 2013-12-19 HISTORY — PX: PORTACATH PLACEMENT: SHX2246

## 2013-12-19 SURGERY — INSERTION, TUNNELED CENTRAL VENOUS DEVICE, WITH PORT
Anesthesia: Monitor Anesthesia Care | Site: Chest

## 2013-12-19 MED ORDER — BUPIVACAINE HCL (PF) 0.25 % IJ SOLN
INTRAMUSCULAR | Status: AC
  Filled 2013-12-19: qty 30

## 2013-12-19 MED ORDER — ONDANSETRON HCL 4 MG/2ML IJ SOLN
INTRAMUSCULAR | Status: DC | PRN
  Administered 2013-12-19: 4 mg via INTRAVENOUS

## 2013-12-19 MED ORDER — GLYCOPYRROLATE 0.2 MG/ML IJ SOLN
INTRAMUSCULAR | Status: DC | PRN
  Administered 2013-12-19: 0.2 mg via INTRAVENOUS

## 2013-12-19 MED ORDER — HEPARIN SOD (PORK) LOCK FLUSH 100 UNIT/ML IV SOLN
INTRAVENOUS | Status: AC
  Filled 2013-12-19: qty 5

## 2013-12-19 MED ORDER — FENTANYL CITRATE 0.05 MG/ML IJ SOLN
25.0000 ug | INTRAMUSCULAR | Status: DC | PRN
  Administered 2013-12-19 (×2): 25 ug via INTRAVENOUS

## 2013-12-19 MED ORDER — LACTATED RINGERS IV SOLN
INTRAVENOUS | Status: DC | PRN
  Administered 2013-12-19 (×2): via INTRAVENOUS

## 2013-12-19 MED ORDER — PROPOFOL 10 MG/ML IV BOLUS
INTRAVENOUS | Status: AC
  Filled 2013-12-19: qty 20

## 2013-12-19 MED ORDER — LIDOCAINE HCL (CARDIAC) 20 MG/ML IV SOLN
INTRAVENOUS | Status: DC | PRN
  Administered 2013-12-19: 80 mg via INTRAVENOUS

## 2013-12-19 MED ORDER — PROMETHAZINE HCL 25 MG/ML IJ SOLN: 6.2500 mg | INTRAMUSCULAR | Status: DC | PRN

## 2013-12-19 MED ORDER — PHENYLEPHRINE HCL 10 MG/ML IJ SOLN
INTRAMUSCULAR | Status: DC | PRN
  Administered 2013-12-19 (×3): 80 ug via INTRAVENOUS

## 2013-12-19 MED ORDER — OXYCODONE HCL 5 MG/5ML PO SOLN: 5.0000 mg | Freq: Once | ORAL | Status: DC | PRN

## 2013-12-19 MED ORDER — MIDAZOLAM HCL 2 MG/2ML IJ SOLN
INTRAMUSCULAR | Status: AC
  Filled 2013-12-19: qty 2

## 2013-12-19 MED ORDER — MIDAZOLAM HCL 5 MG/5ML IJ SOLN
INTRAMUSCULAR | Status: DC | PRN
  Administered 2013-12-19: 2 mg via INTRAVENOUS

## 2013-12-19 MED ORDER — OXYCODONE-ACETAMINOPHEN 5-325 MG PO TABS: 1.0000 | ORAL_TABLET | ORAL | Status: DC | PRN

## 2013-12-19 MED ORDER — DEXAMETHASONE SODIUM PHOSPHATE 4 MG/ML IJ SOLN
INTRAMUSCULAR | Status: DC | PRN
Start: 2013-12-19 — End: 2013-12-19
  Administered 2013-12-19: 4 mg via INTRAVENOUS

## 2013-12-19 MED ORDER — OXYCODONE HCL 5 MG PO TABS: 5.0000 mg | ORAL_TABLET | Freq: Once | ORAL | Status: DC | PRN

## 2013-12-19 MED ORDER — FENTANYL CITRATE 0.05 MG/ML IJ SOLN
INTRAMUSCULAR | Status: DC | PRN
  Administered 2013-12-19: 100 ug via INTRAVENOUS
  Administered 2013-12-19: 50 ug via INTRAVENOUS

## 2013-12-19 MED ORDER — FENTANYL CITRATE 0.05 MG/ML IJ SOLN
INTRAMUSCULAR | Status: AC
  Filled 2013-12-19: qty 2

## 2013-12-19 MED ORDER — FENTANYL CITRATE 0.05 MG/ML IJ SOLN
INTRAMUSCULAR | Status: AC
  Filled 2013-12-19: qty 5

## 2013-12-19 MED ORDER — 0.9 % SODIUM CHLORIDE (POUR BTL) OPTIME
TOPICAL | Status: DC | PRN
  Administered 2013-12-19: 1000 mL

## 2013-12-19 MED ORDER — ONDANSETRON HCL 4 MG/2ML IJ SOLN: 4.0000 mg | Freq: Once | INTRAMUSCULAR | Status: DC | PRN

## 2013-12-19 MED ORDER — BUPIVACAINE HCL (PF) 0.25 % IJ SOLN
INTRAMUSCULAR | Status: DC | PRN
  Administered 2013-12-19: 10 mL

## 2013-12-19 MED ORDER — HEPARIN SOD (PORK) LOCK FLUSH 100 UNIT/ML IV SOLN
INTRAVENOUS | Status: DC | PRN
  Administered 2013-12-19: 500 [IU] via INTRAVENOUS

## 2013-12-19 MED ORDER — MEPERIDINE HCL 25 MG/ML IJ SOLN: 6.2500 mg | INTRAMUSCULAR | Status: DC | PRN

## 2013-12-19 MED ORDER — PROPOFOL 10 MG/ML IV BOLUS
INTRAVENOUS | Status: DC | PRN
  Administered 2013-12-19: 160 mg via INTRAVENOUS

## 2013-12-19 MED ORDER — FENTANYL CITRATE 0.05 MG/ML IJ SOLN: 25.0000 ug | INTRAMUSCULAR | Status: DC | PRN

## 2013-12-19 MED ORDER — SODIUM CHLORIDE 0.9 % IR SOLN
Status: DC | PRN
  Administered 2013-12-19: 08:00:00

## 2013-12-19 SURGICAL SUPPLY — 55 items
ADH SKN CLS APL DERMABOND .7 (GAUZE/BANDAGES/DRESSINGS) ×1
BAG DECANTER FOR FLEXI CONT (MISCELLANEOUS) ×3 IMPLANT
BLADE SURG 10 STRL SS (BLADE) ×2 IMPLANT
BLADE SURG 11 STRL SS (BLADE) ×1 IMPLANT
BLADE SURG 15 STRL LF DISP TIS (BLADE) ×1 IMPLANT
BLADE SURG 15 STRL SS (BLADE) ×3
CHLORAPREP W/TINT 10.5 ML (MISCELLANEOUS) ×3 IMPLANT
COVER SURGICAL LIGHT HANDLE (MISCELLANEOUS) ×3 IMPLANT
CRADLE DONUT ADULT HEAD (MISCELLANEOUS) ×3 IMPLANT
DERMABOND ADVANCED (GAUZE/BANDAGES/DRESSINGS) ×2
DERMABOND ADVANCED .7 DNX12 (GAUZE/BANDAGES/DRESSINGS) ×1 IMPLANT
DRAPE C-ARM 42X72 X-RAY (DRAPES) ×3 IMPLANT
DRAPE CHEST BREAST 15X10 FENES (DRAPES) ×3 IMPLANT
DRAPE UTILITY 15X26 W/TAPE STR (DRAPE) ×6 IMPLANT
ELECT CAUTERY BLADE 6.4 (BLADE) ×3 IMPLANT
ELECT REM PT RETURN 9FT ADLT (ELECTROSURGICAL) ×3
ELECTRODE REM PT RTRN 9FT ADLT (ELECTROSURGICAL) ×1 IMPLANT
GAUZE SPONGE 4X4 16PLY XRAY LF (GAUZE/BANDAGES/DRESSINGS) ×3 IMPLANT
GLOVE BIO SURGEON STRL SZ7 (GLOVE) ×2 IMPLANT
GLOVE BIO SURGEON STRL SZ7.5 (GLOVE) ×3 IMPLANT
GLOVE BIOGEL PI IND STRL 7.0 (GLOVE) IMPLANT
GLOVE BIOGEL PI INDICATOR 7.0 (GLOVE) ×2
GLOVE SURG SS PI 7.0 STRL IVOR (GLOVE) ×2 IMPLANT
GOWN STRL REUS W/ TWL LRG LVL3 (GOWN DISPOSABLE) ×2 IMPLANT
GOWN STRL REUS W/TWL LRG LVL3 (GOWN DISPOSABLE) ×6
INTRODUCER COOK 11FR (CATHETERS) IMPLANT
KIT BASIN OR (CUSTOM PROCEDURE TRAY) ×3 IMPLANT
KIT PORT POWER 8FR ISP CVUE (Catheter) ×2 IMPLANT
KIT PORT POWER 9.6FR MRI PREA (Catheter) IMPLANT
KIT PORT POWER ISP 8FR (Catheter) IMPLANT
KIT POWER CATH 8FR (Catheter) IMPLANT
KIT ROOM TURNOVER OR (KITS) ×3 IMPLANT
NDL HYPO 25GX1X1/2 BEV (NEEDLE) ×1 IMPLANT
NEEDLE 22X1 1/2 (OR ONLY) (NEEDLE) IMPLANT
NEEDLE HYPO 25GX1X1/2 BEV (NEEDLE) ×3 IMPLANT
NS IRRIG 1000ML POUR BTL (IV SOLUTION) ×3 IMPLANT
PACK SURGICAL SETUP 50X90 (CUSTOM PROCEDURE TRAY) ×3 IMPLANT
PAD ARMBOARD 7.5X6 YLW CONV (MISCELLANEOUS) ×3 IMPLANT
PENCIL BUTTON HOLSTER BLD 10FT (ELECTRODE) ×3 IMPLANT
SET INTRODUCER 12FR PACEMAKER (SHEATH) IMPLANT
SET SHEATH INTRODUCER 10FR (MISCELLANEOUS) IMPLANT
SHEATH COOK PEEL AWAY SET 9F (SHEATH) IMPLANT
SUT MNCRL AB 4-0 PS2 18 (SUTURE) ×3 IMPLANT
SUT PROLENE 2 0 SH 30 (SUTURE) ×6 IMPLANT
SUT SILK 2 0 (SUTURE)
SUT SILK 2-0 18XBRD TIE 12 (SUTURE) IMPLANT
SUT VIC AB 3-0 SH 27 (SUTURE) ×3
SUT VIC AB 3-0 SH 27XBRD (SUTURE) ×1 IMPLANT
SYR 20ML ECCENTRIC (SYRINGE) ×6 IMPLANT
SYR 5ML LL (SYRINGE) ×2 IMPLANT
SYR 5ML LUER SLIP (SYRINGE) ×3 IMPLANT
SYR CONTROL 10ML LL (SYRINGE) ×3 IMPLANT
SYRINGE 10CC LL (SYRINGE) IMPLANT
TOWEL OR 17X24 6PK STRL BLUE (TOWEL DISPOSABLE) ×5 IMPLANT
TOWEL OR 17X26 10 PK STRL BLUE (TOWEL DISPOSABLE) ×1 IMPLANT

## 2013-12-19 NOTE — Transfer of Care (Signed)
Immediate Anesthesia Transfer of Care Note  Patient: Karen Douglas  Procedure(s) Performed: Procedure(s): INSERTION PORT-A-CATH (N/A)  Patient Location: PACU  Anesthesia Type:General  Level of Consciousness: awake, alert , oriented, patient cooperative and responds to stimulation  Airway & Oxygen Therapy: Patient Spontanous Breathing and Patient connected to nasal cannula oxygen  Post-op Assessment: Report given to PACU RN, Post -op Vital signs reviewed and stable and Patient moving all extremities X 4  Post vital signs: Reviewed and stable  Complications: No apparent anesthesia complications

## 2013-12-19 NOTE — Anesthesia Preprocedure Evaluation (Signed)
Anesthesia Evaluation  Patient identified by MRN, date of birth, ID band Patient awake    Reviewed: Allergy & Precautions, H&P , NPO status , Patient's Chart, lab work & pertinent test results  Airway Mallampati: II TM Distance: >3 FB     Dental  (+) Partial Lower, Partial Upper, Dental Advisory Given   Pulmonary  breath sounds clear to auscultation        Cardiovascular Rhythm:Regular Rate:Normal     Neuro/Psych    GI/Hepatic   Endo/Other    Renal/GU      Musculoskeletal   Abdominal   Peds  Hematology   Anesthesia Other Findings   Reproductive/Obstetrics                           Anesthesia Physical Anesthesia Plan  ASA: II  Anesthesia Plan: MAC   Post-op Pain Management:    Induction: Intravenous  Airway Management Planned: Natural Airway and Simple Face Mask  Additional Equipment:   Intra-op Plan:   Post-operative Plan:   Informed Consent: I have reviewed the patients History and Physical, chart, labs and discussed the procedure including the risks, benefits and alternatives for the proposed anesthesia with the patient or authorized representative who has indicated his/her understanding and acceptance.   Dental advisory given  Plan Discussed with: CRNA and Anesthesiologist  Anesthesia Plan Comments: (Breast Ca  Plan MAC)        Anesthesia Quick Evaluation

## 2013-12-19 NOTE — Op Note (Signed)
12/19/2013  8:41 AM  PATIENT:  Karen Douglas  58 y.o. female  PRE-OPERATIVE DIAGNOSIS:  right breast cancer  POST-OPERATIVE DIAGNOSIS:  right breast cancer  PROCEDURE:  Procedure(s): INSERTION PORT-A-CATH (N/A)  SURGEON:  Surgeon(s) and Role:    * Jovita Kussmaul, MD - Primary  PHYSICIAN ASSISTANT:   ASSISTANTS: none   ANESTHESIA:   general  EBL:  Total I/O In: 1000 [I.V.:1000] Out: -   BLOOD ADMINISTERED:none  DRAINS: none   LOCAL MEDICATIONS USED:  MARCAINE     SPECIMEN:  No Specimen  DISPOSITION OF SPECIMEN:  N/A  COUNTS:  YES  TOURNIQUET:  * No tourniquets in log *  DICTATION: .Dragon Dictation After informed consent was obtained the patient was brought to the operating room and placed in the supine position on the operating room table. After adequate induction of general anesthesia a roll was placed between the patient's shoulder blades to extend the shoulder slightly. Her left chest and neck area were then prepped with ChloraPrep, allowed to dry, and draped in usual sterile manner. The patient was placed in Trendelenburg position. A large bore needle from the Port-A-Cath kit was used to slide beneath the curve of the clavicle on the lateral left chest aiming towards the sternal notch and in doing so I was able to access the left subclavian vein without difficulty. A wire was placed through the needle using the Seldinger technique without difficulty. The wire was confirmed in the central venous system using real-time fluoroscopy. Next the incision on the left chest was extended medially and laterally. A subcutaneous pocket was created inferior to the incision on the chest wall with blunt finger dissection and some sharp dissection with the electrocautery. Next the tubing was placed on the reservoir and the reservoir was placed in the pocket. The length of the tubing was estimated using real-time fluoroscopy and the tubing was cut to the appropriate length. The sheath and  dilator were then placed over the wire using the Seldinger technique without difficulty. The wire and dilator were removed. The tubing was fed through the sheath as far as it can be fed and held in place while the sheath was gently cracked and separated. Another real-time fluoroscopy image showed the tip of the catheter to be in the distal superior vena cava. The tubing was then permanently attached to the reservoir. The reservoir was anchored in the subcutaneous pocket with 2 2-0 Prolene stitches. The port was then aspirated and it aspirated blood easily. It was then flushed with a dilute heparin solution followed by a more concentrated heparin solution. The subcutaneous tissue was closed over the port with interrupted 3-0 Vicryl stitches. The skin was then closed with a running 4-0 Monocryl subcuticular stitch. Dermabond dressings were applied. The patient tolerated the procedure well. At the end of the case on needle sponge and instrument counts were correct. The patient was then awakened and taken to recovery in stable condition.  PLAN OF CARE: Discharge to home after PACU  PATIENT DISPOSITION:  PACU - hemodynamically stable.   Delay start of Pharmacological VTE agent (>24hrs) due to surgical blood loss or risk of bleeding: not applicable

## 2013-12-19 NOTE — Discharge Instructions (Signed)
What to eat: ° °For your first meals, you should eat lightly; only small meals initially.  If you do not have nausea, you may eat larger meals.  Avoid spicy, greasy and heavy food.   ° °General Anesthesia, Adult, Care After  °Refer to this sheet in the next few weeks. These instructions provide you with information on caring for yourself after your procedure. Your health care provider may also give you more specific instructions. Your treatment has been planned according to current medical practices, but problems sometimes occur. Call your health care provider if you have any problems or questions after your procedure.  °WHAT TO EXPECT AFTER THE PROCEDURE  °After the procedure, it is typical to experience:  °Sleepiness.  °Nausea and vomiting. °HOME CARE INSTRUCTIONS  °For the first 24 hours after general anesthesia:  °Have a responsible person with you.  °Do not drive a car. If you are alone, do not take public transportation.  °Do not drink alcohol.  °Do not take medicine that has not been prescribed by your health care provider.  °Do not sign important papers or make important decisions.  °You may resume a normal diet and activities as directed by your health care provider.  °Change bandages (dressings) as directed.  °If you have questions or problems that seem related to general anesthesia, call the hospital and ask for the anesthetist or anesthesiologist on call. °SEEK MEDICAL CARE IF:  °You have nausea and vomiting that continue the day after anesthesia.  °You develop a rash. °SEEK IMMEDIATE MEDICAL CARE IF:  °You have difficulty breathing.  °You have chest pain.  °You have any allergic problems. °Document Released: 06/30/2000 Document Revised: 11/24/2012 Document Reviewed: 10/07/2012  °ExitCare® Patient Information ©2014 [employer].  ° °Tissue Adhesive Wound Care  ° ° °Some cuts and wounds can be closed with tissue adhesive. Adhesive is like glue. It holds the skin together and helps a wound heal faster.  This adhesive goes away on its own as the wound heals.  °HOME CARE  °Showers are allowed. Do not soak the wound in water. Do not take baths, swim, or use hot tubs. Do not use soaps or creams on your wound.  °If a bandage (dressing) was put on, change it as often as told by your doctor.  °Keep the bandage dry.  °Do not scratch, pick, or rub the adhesive.  °Do not put tape over the adhesive. The adhesive could come off.  °Protect the wound from another injury.  °Protect the wound from sun and tanning beds.  °Only take medicine as told by your doctor.  °Keep all doctor visits as told. °GET HELP RIGHT AWAY IF:  °Your wound is red, puffy (swollen), hot, or tender.  °You get a rash after the glue is put on.  °You have more pain in the wound.  °You have a red streak going away from the wound.  °You have yellowish-white fluid (pus) coming from the wound.  °You have more bleeding.  °You have a fever.  °You have chills and start to shake.  °You notice a bad smell coming from the wound.  °Your wound or adhesive breaks open. °MAKE SURE YOU:  °Understand these instructions.  °Will watch your condition.  °Will get help right away if you are not doing well or get worse. °Document Released: 01/01/2008 Document Revised: 01/12/2013 Document Reviewed: 10/13/2012  °ExitCare® Patient Information ©2015 [employer]. This information is not intended to replace advice given to you by your health care provider.   Make sure you discuss any questions you have with your health care provider.  ° ° °

## 2013-12-19 NOTE — Progress Notes (Signed)
Dr Linna Caprice at bedside and is aware Pt 02 sats occasionally drop to 88%. States she is to continue using her incentive spirometer, and hold awhile longer in pacu.  States pt may be discharged home with sats at 90%

## 2013-12-19 NOTE — Transfer of Care (Signed)
Immediate Anesthesia Transfer of Care Note  Patient: Karen Douglas  Procedure(s) Performed: Procedure(s): INSERTION PORT-A-CATH (N/A)  Patient Location: PACU  Anesthesia Type:General  Level of Consciousness: awake, alert , oriented and responds to stimulation  Airway & Oxygen Therapy: Patient Spontanous Breathing and Patient connected to nasal cannula oxygen  Post-op Assessment: Report given to PACU RN, Post -op Vital signs reviewed and stable and Patient moving all extremities X 4  Post vital signs: Reviewed and stable  Complications: No apparent anesthesia complications

## 2013-12-19 NOTE — H&P (Signed)
Karen Douglas 12/07/2013 8:45 AM Location: [facility] Patient #: [id number] DOB: 03-30-1956 Undefined / Language: Karen Douglas / Race: Undefined Female  History of Present Illness [name]. [name] MD; 12/07/2013 3:25 PM) The patient is a 58 year old female who presents with breast cancer. The pt presents with a palpable mass in the lateral right breast. she has also had pain in this area as well as thickening. she denies discharge from the nipple. she does not take female hormones. this measured 11.5cm by MRI. This was biopsied and was a Grade 2 invasive ductal cancer. she was ER and PR positive and her2 neg with a Ki 67 of 95%.   Other Problems [name], [place]; 12/07/2013 8:45 AM) Breast Cancer Hypercholesterolemia Lump In Breast  Past Surgical History [name], [place]; 12/07/2013 8:45 AM) Breast Biopsy Right. Hysterectomy (due to cancer) - Complete  Diagnostic Studies History [name], [place]; 12/07/2013 8:45 AM) Colonoscopy 1-5 years ago Mammogram within last year  Social History [name], [place]; 12/07/2013 8:45 AM) Alcohol use Occasional alcohol use. Caffeine use Coffee. No drug use Tobacco use Never smoker.  Family History [name], [place]; 12/07/2013 8:45 AM) Alcohol Abuse Brother. Depression Sister. Diabetes Mellitus Mother. Heart Disease Mother. Heart disease in female family member before age 79 Hypertension Mother, Sister. Kidney Disease Mother.  Pregnancy / Birth History [name], [place]; 12/07/2013 8:45 AM) Age at menarche 53 years. Age of menopause 51-55 Contraceptive History Oral contraceptives. Gravida 2 Maternal age 39-25 Para 2 Regular periods  Review of Systems [name]; 12/07/2013 8:45 AM) General Not Present- Appetite Loss, Chills, Fatigue, Fever, Night Sweats, Weight Gain and Weight Loss. Skin Not Present- Change in Wart/Mole, Dryness, Hives, Jaundice, New Lesions, Non-Healing Wounds, Rash and Ulcer. HEENT Not  Present- Earache, Hearing Loss, Hoarseness, Nose Bleed, Oral Ulcers, Ringing in the Ears, Seasonal Allergies, Sinus Pain, Sore Throat, Visual Disturbances, Wears glasses/contact lenses and Yellow Eyes. Respiratory Not Present- Bloody sputum, Chronic Cough, Difficulty Breathing, Snoring and Wheezing. Breast Present- Breast Mass. Not Present- Breast Pain, Nipple Discharge and Skin Changes. Cardiovascular Not Present- Chest Pain, Difficulty Breathing Lying Down, Leg Cramps, Palpitations, Rapid Heart Rate, Shortness of Breath and Swelling of Extremities. Gastrointestinal Not Present- Abdominal Pain, Bloating, Bloody Stool, Change in Bowel Habits, Chronic diarrhea, Constipation, Difficulty Swallowing, Excessive gas, Gets full quickly at meals, Hemorrhoids, Indigestion, Nausea, Rectal Pain and Vomiting. Female Genitourinary Not Present- Frequency, Nocturia, Painful Urination, Pelvic Pain and Urgency. Musculoskeletal Not Present- Back Pain, Joint Pain, Joint Stiffness, Muscle Pain, Muscle Weakness and Swelling of Extremities. Neurological Not Present- Decreased Memory, Fainting, Headaches, Numbness, Seizures, Tingling, Tremor, Trouble walking and Weakness. Psychiatric Not Present- Anxiety, Bipolar, Change in Sleep Pattern, Depression, Fearful and Frequent crying. Endocrine Present- Hot flashes. Not Present- Cold Intolerance, Excessive Hunger, Hair Changes, Heat Intolerance and New Diabetes. Hematology Present- Easy Bruising. Not Present- Excessive bleeding, Gland problems, HIV and Persistent Infections.   Physical Exam [name] S. [name] MD; 12/07/2013 3:27 PM) General Mental Status-Alert. General Appearance-Consistent with stated age. Hydration-Well hydrated. Voice-Normal.  Head and Neck Head-normocephalic, atraumatic with no lesions or palpable masses. Trachea-midline. Thyroid Gland Characteristics - normal size and consistency.  Eye Eyeball - Bilateral-Extraocular movements  intact. Sclera/Conjunctiva - Bilateral-No scleral icterus.  Chest and Lung Exam Chest and lung exam reveals -quiet, even and easy respiratory effort with no use of accessory muscles, normal resonance, no flatness or dullness, non-tender and normal tactile fremitus and on auscultation, normal breath sounds, no adventitious sounds and normal vocal resonance. Inspection  Chest Wall - Normal. Back - normal.  Breast Breast - Left-Symmetric, Non Tender, No Biopsy scars, no Dimpling, No Inflammation, No Lumpectomy scars, No Mastectomy scars, No Peau d' Orange. Breast - Right-Dimpling, Inflamed and Peau d' Orange. Note: There is a large palpable mass laterally in the right breast that is mobile but takes up the lateral half of the breast. there is skin thickening Breast Lump-No Palpable Breast Mass.  Cardiovascular Cardiovascular examination reveals -on palpation PMI is normal in location and amplitude, no palpable S3 or S4. Normal cardiac borders., normal heart sounds, regular rate and rhythm with no murmurs, carotid auscultation reveals no bruits and normal pedal pulses bilaterally.  Abdomen Inspection Inspection of the abdomen reveals - No Hernias. Skin - Scar - no surgical scars. Palpation/Percussion Palpation and Percussion of the abdomen reveal - Soft, Non Tender, No Rebound tenderness, No Rigidity (guarding) and No hepatosplenomegaly. Auscultation Auscultation of the abdomen reveals - Bowel sounds normal.  Peripheral Vascular Upper Extremity Inspection - Bilateral - Normal - No Clubbing, No Cyanosis, No Edema, Pulses Intact. Palpation - Pulses bilaterally normal. Lower Extremity Palpation - Pulses bilaterally normal.  Neurologic Neurologic evaluation reveals -alert and oriented x 3 with no impairment of recent or remote memory. Mental Status-Normal.  Musculoskeletal Normal Exam - Left-Upper Extremity Strength Normal and Lower Extremity Strength Normal. Normal Exam  - Right-Upper Extremity Strength Normal, Lower Extremity Weakness.  Lymphatic Head & Neck  General Head & Neck Lymphatics: Bilateral - Description - Normal. Axillary  General Axillary Region: Bilateral - Description - Normal. Tenderness - Non Tender. Femoral & Inguinal  Generalized Femoral & Inguinal Lymphatics: Bilateral - Description - Normal. Tenderness - Non Tender.    Assessment & Plan [name] S. [name] MD; 12/07/2013 3:31 PM) BREAST CANCER, FEMALE (174.9  C50.919) MALIGNANT NEOPLASM OF UPPER-OUTER QUADRANT OF RIGHT FEMALE BREAST (174.4  C50.411) Impression: The pt has a large locally advanced right breast cancer. I have discussed with her the different options for treatment and I think she would benefit from neoadjuvant chemotherapy. If it shrinks then she will need right modified radical mastectomy. I have discussed with her the risks and benefits of placing a port and she understands and wishes to proceed Current Plans  Follow up in 1 month or as needed   Signed by Luella Cook, MD (12/07/2013 3:33 PM)

## 2013-12-20 ENCOUNTER — Ambulatory Visit (HOSPITAL_COMMUNITY)
Admission: RE | Admit: 2013-12-20 | Discharge: 2013-12-20 | Disposition: A | Discharge status: Home / Self Care | Payer: 59 | Source: Ambulatory Visit | Attending: Hematology and Oncology | Admitting: Hematology and Oncology

## 2013-12-20 ENCOUNTER — Encounter (HOSPITAL_COMMUNITY): Payer: Self-pay

## 2013-12-20 ENCOUNTER — Other Ambulatory Visit: Payer: Self-pay

## 2013-12-20 ENCOUNTER — Telehealth: Payer: Self-pay | Admitting: Hematology and Oncology

## 2013-12-20 DIAGNOSIS — C50519 Malignant neoplasm of lower-outer quadrant of unspecified female breast: Secondary | ICD-10-CM | POA: Insufficient documentation

## 2013-12-20 DIAGNOSIS — C50511 Malignant neoplasm of 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: Secondary | ICD-10-CM

## 2013-12-20 LAB — GLUCOSE, CAPILLARY: Glucose-Capillary: 94 mg/dL (ref 70–99)

## 2013-12-20 MED ORDER — FLUDEOXYGLUCOSE F - 18 (FDG) INJECTION: 9.8100 | Freq: Once | INTRAVENOUS | Status: AC | PRN

## 2013-12-20 NOTE — Telephone Encounter (Signed)
per pof tos ch pt appt-sent MW email to sch trmt-will call pt after reply °

## 2013-12-21 ENCOUNTER — Telehealth: Payer: Self-pay | Admitting: *Deleted

## 2013-12-21 ENCOUNTER — Telehealth: Payer: Self-pay | Admitting: Hematology and Oncology

## 2013-12-21 NOTE — Telephone Encounter (Signed)
Per staff message and POF I have scheduled appts. Advised scheduler of appts. JMW  

## 2013-12-21 NOTE — Telephone Encounter (Signed)
cld & spoke to pt and gave pt time & date of appt-pt understood °

## 2013-12-22 ENCOUNTER — Encounter (HOSPITAL_COMMUNITY): Payer: Self-pay | Admitting: General Surgery

## 2013-12-27 NOTE — Anesthesia Postprocedure Evaluation (Signed)
Anesthesia Post Note  Patient: Karen Douglas  Procedure(s) Performed: Procedure(s) (LRB): INSERTION PORT-A-CATH (N/A)  Anesthesia type: General  Patient location: PACU  Post pain: Pain level controlled and Adequate analgesia  Post assessment: Post-op Vital signs reviewed, Patient's Cardiovascular Status Stable, Respiratory Function Stable, Patent Airway and Pain level controlled  Last Vitals:  Filed Vitals:   12/19/13 1117  BP: 116/82  Pulse: 86  Temp:   Resp: 15    Post vital signs: Reviewed and stable  Level of consciousness: awake, alert  and oriented  Complications: No apparent anesthesia complications

## 2013-12-29 ENCOUNTER — Other Ambulatory Visit: Payer: Self-pay | Admitting: Hematology and Oncology

## 2013-12-29 ENCOUNTER — Other Ambulatory Visit: Payer: Self-pay | Admitting: *Deleted

## 2013-12-29 DIAGNOSIS — C50511 Malignant neoplasm of 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: Secondary | ICD-10-CM

## 2013-12-29 MED ORDER — PROCHLORPERAZINE MALEATE 10 MG PO TABS: 10.0000 mg | ORAL_TABLET | Freq: Four times a day (QID) | ORAL | Status: DC | PRN

## 2013-12-29 MED ORDER — LORAZEPAM 0.5 MG PO TABS
0.5000 mg | ORAL_TABLET | Freq: Four times a day (QID) | ORAL | Status: DC | PRN
Start: 2013-12-29 — End: 2013-12-29

## 2013-12-29 MED ORDER — DEXAMETHASONE 4 MG PO TABS: 8.0000 mg | ORAL_TABLET | Freq: Two times a day (BID) | ORAL | Status: DC

## 2013-12-29 MED ORDER — ONDANSETRON HCL 8 MG PO TABS: 8.0000 mg | ORAL_TABLET | Freq: Two times a day (BID) | ORAL | Status: DC | PRN

## 2013-12-29 MED ORDER — LORAZEPAM 0.5 MG PO TABS: 0.5000 mg | ORAL_TABLET | Freq: Four times a day (QID) | ORAL | Status: DC | PRN

## 2013-12-30 ENCOUNTER — Ambulatory Visit (HOSPITAL_BASED_OUTPATIENT_CLINIC_OR_DEPARTMENT_OTHER): Payer: 59

## 2013-12-30 ENCOUNTER — Ambulatory Visit (HOSPITAL_BASED_OUTPATIENT_CLINIC_OR_DEPARTMENT_OTHER): Payer: 59 | Admitting: Hematology and Oncology

## 2013-12-30 ENCOUNTER — Other Ambulatory Visit: Payer: Self-pay | Admitting: *Deleted

## 2013-12-30 ENCOUNTER — Other Ambulatory Visit (HOSPITAL_BASED_OUTPATIENT_CLINIC_OR_DEPARTMENT_OTHER): Payer: 59

## 2013-12-30 VITALS — BP 123/81 | HR 75 | Temp 98.3°F | Resp 18

## 2013-12-30 VITALS — BP 126/86 | HR 71 | Temp 98.4°F | Resp 18 | Ht 61.0 in | Wt 195.0 lb

## 2013-12-30 DIAGNOSIS — C778 Secondary and unspecified malignant neoplasm of lymph nodes of multiple regions: Secondary | ICD-10-CM

## 2013-12-30 DIAGNOSIS — C7800 Secondary malignant neoplasm of unspecified lung: Secondary | ICD-10-CM

## 2013-12-30 DIAGNOSIS — C50919 Malignant neoplasm of unspecified site of unspecified female breast: Secondary | ICD-10-CM

## 2013-12-30 DIAGNOSIS — C50511 Malignant neoplasm of 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: Secondary | ICD-10-CM

## 2013-12-30 DIAGNOSIS — Z5111 Encounter for antineoplastic chemotherapy: Secondary | ICD-10-CM

## 2013-12-30 LAB — CBC WITH DIFFERENTIAL/PLATELET
BASO%: 0.8 % (ref 0.0–2.0)
Basophils Absolute: 0 10*3/uL (ref 0.0–0.1)
EOS%: 1.8 % (ref 0.0–7.0)
Eosinophils Absolute: 0.1 10*3/uL (ref 0.0–0.5)
HEMATOCRIT: 38.4 % (ref 34.8–46.6)
HGB: 12.3 g/dL (ref 11.6–15.9)
LYMPH%: 35.7 % (ref 14.0–49.7)
MCH: 28.1 pg (ref 25.1–34.0)
MCHC: 32 g/dL (ref 31.5–36.0)
MCV: 88 fL (ref 79.5–101.0)
MONO#: 0.6 10*3/uL (ref 0.1–0.9)
MONO%: 12 % (ref 0.0–14.0)
NEUT#: 2.3 10*3/uL (ref 1.5–6.5)
NEUT%: 49.7 % (ref 38.4–76.8)
Platelets: 230 10*3/uL (ref 145–400)
RBC: 4.37 10*6/uL (ref 3.70–5.45)
RDW: 14.9 % — ABNORMAL HIGH (ref 11.2–14.5)
WBC: 4.6 10*3/uL (ref 3.9–10.3)
lymph#: 1.7 10*3/uL (ref 0.9–3.3)

## 2013-12-30 LAB — COMPREHENSIVE METABOLIC PANEL (CC13)
ALK PHOS: 77 U/L (ref 40–150)
ANION GAP: 11 meq/L (ref 3–11)
AST: 13 U/L (ref 5–34)
Albumin: 3.5 g/dL (ref 3.5–5.0)
BILIRUBIN TOTAL: 0.39 mg/dL (ref 0.20–1.20)
BUN: 10 mg/dL (ref 7.0–26.0)
CO2: 23 mEq/L (ref 22–29)
CREATININE: 0.9 mg/dL (ref 0.6–1.1)
Calcium: 9.5 mg/dL (ref 8.4–10.4)
Chloride: 109 mEq/L (ref 98–109)
Glucose: 101 mg/dl (ref 70–140)
Potassium: 4.4 mEq/L (ref 3.5–5.1)
Sodium: 143 mEq/L (ref 136–145)
Total Protein: 7.4 g/dL (ref 6.4–8.3)

## 2013-12-30 MED ORDER — ONDANSETRON 16 MG/50ML IVPB (CHCC)
16.0000 mg | Freq: Once | INTRAVENOUS | Status: AC
  Administered 2013-12-30: 16 mg via INTRAVENOUS

## 2013-12-30 MED ORDER — SODIUM CHLORIDE 0.9 % IV SOLN
Freq: Once | INTRAVENOUS | Status: AC
  Administered 2013-12-30: 10:00:00 via INTRAVENOUS

## 2013-12-30 MED ORDER — SODIUM CHLORIDE 0.9 % IJ SOLN
10.0000 mL | INTRAMUSCULAR | Status: DC | PRN
  Administered 2013-12-30: 10 mL
  Filled 2013-12-30: qty 10

## 2013-12-30 MED ORDER — SODIUM CHLORIDE 0.9 % IV SOLN
600.0000 mg/m2 | Freq: Once | INTRAVENOUS | Status: AC
  Administered 2013-12-30: 1180 mg via INTRAVENOUS
  Filled 2013-12-30: qty 59

## 2013-12-30 MED ORDER — DEXTROSE 5 % IV SOLN
75.0000 mg/m2 | Freq: Once | INTRAVENOUS | Status: AC
  Administered 2013-12-30: 150 mg via INTRAVENOUS
  Filled 2013-12-30: qty 15

## 2013-12-30 MED ORDER — DEXAMETHASONE SODIUM PHOSPHATE 20 MG/5ML IJ SOLN
INTRAMUSCULAR | Status: AC
  Filled 2013-12-30: qty 5

## 2013-12-30 MED ORDER — ONDANSETRON 16 MG/50ML IVPB (CHCC)
INTRAVENOUS | Status: AC
  Filled 2013-12-30: qty 16

## 2013-12-30 MED ORDER — DEXAMETHASONE SODIUM PHOSPHATE 20 MG/5ML IJ SOLN
20.0000 mg | Freq: Once | INTRAMUSCULAR | Status: AC
  Administered 2013-12-30: 20 mg via INTRAVENOUS

## 2013-12-30 MED ORDER — HEPARIN SOD (PORK) LOCK FLUSH 100 UNIT/ML IV SOLN
500.0000 [IU] | Freq: Once | INTRAVENOUS | Status: AC | PRN
  Administered 2013-12-30: 500 [IU]
  Filled 2013-12-30: qty 5

## 2013-12-30 MED ORDER — LIDOCAINE-PRILOCAINE 2.5-2.5 % EX CREA: 1.0000 "application " | TOPICAL_CREAM | CUTANEOUS | Status: DC | PRN

## 2013-12-30 NOTE — Progress Notes (Signed)
Patient Care Team: Karen Beard, MD as PCP - General (Family Medicine) Karen Messing III, MD as Consulting Physician (General Surgery) Karen Eisenmenger, MD as Consulting Physician (Hematology and Oncology) Karen Edison, MD as Consulting Physician (Radiation Oncology)  DIAGNOSIS: Breast cancer of 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   Primary site: Breast (Right)   Staging method: AJCC 7th Edition   Clinical: Stage IV (T4b, N1, M1) signed by Karen Eisenmenger, MD on 12/30/2013  9:08 AM   Summary: Stage IV (T4b, N1, M1)   SUMMARY OF ONCOLOGIC HISTORY:   Breast cancer of 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   12/01/2013 Initial Diagnosis Breast cancer of 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   12/06/2013 Breast MRI Enhancing tumor throughout the right breast all quadrants with diffuse skin thickening 11.5 x 11 x 5 cm: Spiculated mass posterior UOQ right breast 2.6 x 2.5 x 2 cm and abuts pectoralis muscle: 2 abnormal axillary lymph nodes largest 2.2 cm    CHIEF COMPLIANT: Patient is here for chemotherapy  INTERVAL HISTORY: Karen Douglas is a 58 year old African American lady with above-mentioned history of 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 She was seen at her breast [facility] clinic and we performed a PET/CT scan and she is here today to discuss the PET scan and to initiate chemotherapy. We initially discussed with her to start chemotherapy with TAC. She went through chemotherapy, echocardiogram and a PET/CT scan. Unfortunately the PET/CT scan showed lung, hilar and mediastinal metastases. There was also a left ninth rib lesion. She does not complaining of any pain in the thigh. There appeared to be an abnormality in the right thigh muscle. She is here today accompanied by her husband.   REVIEW OF SYSTEMS:   Constitutional: Denies fevers, chills or abnormal weight loss Eyes: Denies blurriness of vision Ears, nose, mouth, throat, and face: Denies mucositis or sore  throat Respiratory: Denies cough, dyspnea or wheezes Cardiovascular: Denies palpitation, chest discomfort or lower extremity swelling Gastrointestinal:  Denies nausea, heartburn or change in bowel habits Skin: Denies abnormal skin rashes Lymphatics: Denies new lymphadenopathy or easy bruising Neurological:Denies numbness, tingling or new weaknesses Behavioral/Psych: Tearful and stressed regarding the findings on PET scan Breast: Large right-sided breast mass with axillary lymph nodes All other systems were reviewed with the patient and are negative.  I have reviewed the past medical history, past surgical history, social history and family history with the patient and they are unchanged from previous note.  ALLERGIES:  has No Known Allergies.  MEDICATIONS:  Current Outpatient Prescriptions  Medication Sig Dispense Refill  . atorvastatin (LIPITOR) 20 MG tablet Take 20 mg by mouth daily.      Marland Kitchen dexamethasone (DECADRON) 4 MG tablet Take 2 tablets (8 mg total) by mouth 2 (two) times daily. Start the day before Taxotere. Take once the day after, then 2 times a day x 2d.  30 tablet  1  . LORazepam (ATIVAN) 0.5 MG tablet Take 1 tablet (0.5 mg total) by mouth every 6 (six) hours as needed (Nausea or vomiting).  30 tablet  0  . ondansetron (ZOFRAN) 8 MG tablet Take 1 tablet (8 mg total) by mouth 2 (two) times daily as needed (Nausea or vomiting). Begin 4 days after chemotherapy.  30 tablet  1  . oxyCODONE-acetaminophen (ROXICET) 5-325 MG per tablet Take 1-2 tablets by mouth every 4 (four) hours as needed.  50 tablet  0  . prochlorperazine (COMPAZINE) 10 MG tablet Take 1 tablet (10  mg total) by mouth every 6 (six) hours as needed (Nausea or vomiting).  30 tablet  1  . lidocaine-prilocaine (EMLA) cream Apply 1 application topically as needed.  30 g  0   No current facility-administered medications for this visit.    PHYSICAL EXAMINATION: ECOG PERFORMANCE STATUS: 1 - Symptomatic but completely  ambulatory  Filed Vitals:   12/30/13 0838  BP: 126/86  Pulse: 71  Temp: 98.4 F (36.9 C)  Resp: 18   Filed Weights   12/30/13 0838  Weight: 195 lb (88.451 kg)    GENERAL:alert, no distress and comfortable SKIN: skin color, texture, turgor are normal, no rashes or significant lesions EYES: normal, Conjunctiva are pink and non-injected, sclera clear OROPHARYNX:no exudate, no erythema and lips, buccal mucosa, and tongue normal  NECK: supple, thyroid normal size, non-tender, without nodularity LYMPH:  no palpable lymphadenopathy in the cervical, axillary or inguinal LUNGS: clear to auscultation and percussion with normal breathing effort HEART: regular rate & rhythm and no murmurs and no lower extremity edema ABDOMEN:abdomen soft, non-tender and normal bowel sounds Musculoskeletal:no cyanosis of digits and no clubbing  NEURO: alert & oriented x 3 with fluent speech, no focal motor/sensory deficits  LABORATORY DATA:  I have reviewed the data as listed   Chemistry      Component Value Date/Time   NA 143 12/30/2013 0818   K 4.4 12/30/2013 0818   CO2 23 12/30/2013 0818   BUN 10.0 12/30/2013 0818   CREATININE 0.9 12/30/2013 0818      Component Value Date/Time   CALCIUM 9.5 12/30/2013 0818   ALKPHOS 77 12/30/2013 0818   AST 13 12/30/2013 0818   ALT <6 12/30/2013 0818   BILITOT 0.39 12/30/2013 0818       Lab Results  Component Value Date   WBC 4.6 12/30/2013   HGB 12.3 12/30/2013   HCT 38.4 12/30/2013   MCV 88.0 12/30/2013   PLT 230 12/30/2013   NEUTROABS 2.3 12/30/2013     RADIOGRAPHIC STUDIES: I have personally reviewed the radiology reports and agreed with their findings. No results found. PET CT scan results showed an extremely large right breast mass with right axillary, right lung hilar and mediastinal lymphadenopathy that is very hypermetabolic, left ninth rib lesion probably posttraumatic. 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 Patient is clearly devastated hearing these results. I discussed with them that the treatment plan would need to change from definitive/curative chemotherapy to palliative treatment. Given the extent of the disease, I elected to use doublet chemotherapy with Taxotere and Cytoxan and dropped Adriamycin. Our goal is to control her primary cancer and see if we can resolve these lung metastases. If we're successful in that, there is a potential for her to still undergo mastectomy. Even though the goal of chemotherapy is palliative, because of her young age, we are using more aggressive chemotherapy approach to see if we can eradicate distant metastatic disease to make her eligible to receive more definitive surgery  and radiation.  Distress screening: Patient is extremely distraught hearing these results but is keen on receiving treatment and going through with chemotherapy that is planned for today. I offered support services and counseled her.  Return to clinic in one week for toxicity check to see Mendel Ryder, our NP.    No orders of the defined types were placed in this encounter.   The patient has a good understanding of the overall plan. she agrees with it. She will call with any problems that may develop before her next visit here.  I spent 25 minutes counseling the patient face to face. The total time spent in the appointment was 30 minutes and more than 50% was on counseling and review  of test results    Karen Eisenmenger, MD 12/30/2013 9:13 AM

## 2013-12-30 NOTE — Patient Instructions (Signed)
[facility] Discharge Instructions for Patients Receiving Chemotherapy  Today you received the following chemotherapy agents: Taxotere and Cytoxan  To help prevent nausea and vomiting after your treatment, we encourage you to take your nausea medication; Compazine 10mg  every 6 hours as needed.   If you develop nausea and vomiting that is not controlled by your nausea medication, call the clinic.   BELOW ARE SYMPTOMS THAT SHOULD BE REPORTED IMMEDIATELY:  *FEVER GREATER THAN 100.5 F  *CHILLS WITH OR WITHOUT FEVER  NAUSEA AND VOMITING THAT IS NOT CONTROLLED WITH YOUR NAUSEA MEDICATION  *UNUSUAL SHORTNESS OF BREATH  *UNUSUAL BRUISING OR BLEEDING  TENDERNESS IN MOUTH AND THROAT WITH OR WITHOUT PRESENCE OF ULCERS  *URINARY PROBLEMS  *BOWEL PROBLEMS  UNUSUAL RASH Items with * indicate a potential emergency and should be followed up as soon as possible.  Feel free to call the clinic you have any questions or concerns. The clinic phone number is [phone].   Docetaxel injection What is this medicine? DOCETAXEL (doe se TAX el) is a chemotherapy drug. It targets fast dividing cells, like cancer cells, and causes these cells to die. This medicine is used to treat many types of cancers like breast cancer, certain stomach cancers, head and neck cancer, lung cancer, and prostate cancer. This medicine may be used for other purposes; ask your health care provider or pharmacist if you have questions. COMMON BRAND NAME(S): Docefrez, Taxotere What should I tell my health care provider before I take this medicine? They need to know if you have any of these conditions: -infection (especially a virus infection such as chickenpox, cold sores, or herpes) -liver disease -low blood counts, like low white cell, platelet, or red cell counts -an unusual or allergic reaction to docetaxel, polysorbate 80, other chemotherapy agents, other medicines, foods, dyes, or  preservatives -pregnant or trying to get pregnant -breast-feeding How should I use this medicine? This drug is given as an infusion into a vein. It is administered in a hospital or clinic by a specially trained health care professional. Talk to your pediatrician regarding the use of this medicine in children. Special care may be needed. Overdosage: If you think you have taken too much of this medicine contact a poison control center or emergency room at once. NOTE: This medicine is only for you. Do not share this medicine with others. What if I miss a dose? It is important not to miss your dose. Call your doctor or health care professional if you are unable to keep an appointment. What may interact with this medicine? -cyclosporine -erythromycin -ketoconazole -medicines to increase blood counts like filgrastim, pegfilgrastim, sargramostim -vaccines Talk to your doctor or health care professional before taking any of these medicines: -acetaminophen -aspirin -ibuprofen -ketoprofen -naproxen This list may not describe all possible interactions. Give your health care provider a list of all the medicines, herbs, non-prescription drugs, or dietary supplements you use. Also tell them if you smoke, drink alcohol, or use illegal drugs. Some items may interact with your medicine. What should I watch for while using this medicine? Your condition will be monitored carefully while you are receiving this medicine. You will need important blood work done while you are taking this medicine. This drug may make you feel generally unwell. This is not uncommon, as chemotherapy can affect healthy cells as well as cancer cells. Report any side effects. Continue your course of treatment even though you feel ill unless your doctor tells you to stop. In some cases,  you may be given additional medicines to help with side effects. Follow all directions for their use. Call your doctor or health care professional for  advice if you get a fever, chills or sore throat, or other symptoms of a cold or flu. Do not treat yourself. This drug decreases your body's ability to fight infections. Try to avoid being around people who are sick. This medicine may increase your risk to bruise or bleed. Call your doctor or health care professional if you notice any unusual bleeding. Be careful brushing and flossing your teeth or using a toothpick because you may get an infection or bleed more easily. If you have any dental work done, tell your dentist you are receiving this medicine. Avoid taking products that contain aspirin, acetaminophen, ibuprofen, naproxen, or ketoprofen unless instructed by your doctor. These medicines may hide a fever. This medicine contains an alcohol in the product. You may get drowsy or dizzy. Do not drive, use machinery, or do anything that needs mental alertness until you know how this medicine affects you. Do not stand or sit up quickly, especially if you are an older patient. This reduces the risk of dizzy or fainting spells. Avoid alcoholic drinks Do not become pregnant while taking this medicine. Women should inform their doctor if they wish to become pregnant or think they might be pregnant. There is a potential for serious side effects to an unborn child. Talk to your health care professional or pharmacist for more information. Do not breast-feed an infant while taking this medicine. What side effects may I notice from receiving this medicine? Side effects that you should report to your doctor or health care professional as soon as possible: -allergic reactions like skin rash, itching or hives, swelling of the face, lips, or tongue -low blood counts - This drug may decrease the number of white blood cells, red blood cells and platelets. You may be at increased risk for infections and bleeding. -signs of infection - fever or chills, cough, sore throat, pain or difficulty passing urine -signs of  decreased platelets or bleeding - bruising, pinpoint red spots on the skin, black, tarry stools, nosebleeds -signs of decreased red blood cells - unusually weak or tired, fainting spells, lightheadedness -breathing problems -fast or irregular heartbeat -low blood pressure -mouth sores -nausea and vomiting -pain, swelling, redness or irritation at the injection site -pain, tingling, numbness in the hands or feet -swelling of the ankle, feet, hands -weight gain Side effects that usually do not require medical attention (report to your prescriber or health care professional if they continue or are bothersome): -bone pain -complete hair loss including hair on your head, underarms, pubic hair, eyebrows, and eyelashes -diarrhea -excessive tearing -changes in the color of fingernails -loosening of the fingernails -nausea -muscle pain -red flush to skin -sweating -weak or tired This list may not describe all possible side effects. Call your doctor for medical advice about side effects. You may report side effects to FDA at 1-800-FDA-1088. Where should I keep my medicine? This drug is given in a hospital or clinic and will not be stored at home. NOTE: This sheet is a summary. It may not cover all possible information. If you have questions about this medicine, talk to your doctor, pharmacist, or health care provider.  2015, Elsevier/Gold Standard. (2013-02-17 22:21:02)   Cyclophosphamide injection What is this medicine? CYCLOPHOSPHAMIDE (sye kloe FOSS fa mide) is a chemotherapy drug. It slows the growth of cancer cells. This medicine is used to treat  many types of cancer like lymphoma, myeloma, leukemia, breast cancer, and ovarian cancer, to name a few. This medicine may be used for other purposes; ask your health care provider or pharmacist if you have questions. COMMON BRAND NAME(S): Cytoxan, Neosar What should I tell my health care provider before I take this medicine? They need to  know if you have any of these conditions: -blood disorders -history of other chemotherapy -infection -kidney disease -liver disease -recent or ongoing radiation therapy -tumors in the bone marrow -an unusual or allergic reaction to cyclophosphamide, other chemotherapy, other medicines, foods, dyes, or preservatives -pregnant or trying to get pregnant -breast-feeding How should I use this medicine? This drug is usually given as an injection into a vein or muscle or by infusion into a vein. It is administered in a hospital or clinic by a specially trained health care professional. Talk to your pediatrician regarding the use of this medicine in children. Special care may be needed. Overdosage: If you think you have taken too much of this medicine contact a poison control center or emergency room at once. NOTE: This medicine is only for you. Do not share this medicine with others. What if I miss a dose? It is important not to miss your dose. Call your doctor or health care professional if you are unable to keep an appointment. What may interact with this medicine? This medicine may interact with the following medications: -amiodarone -amphotericin B -azathioprine -certain antiviral medicines for HIV or AIDS such as protease inhibitors (e.g., indinavir, ritonavir) and zidovudine -certain blood pressure medications such as benazepril, captopril, enalapril, fosinopril, lisinopril, moexipril, monopril, perindopril, quinapril, ramipril, trandolapril -certain cancer medications such as anthracyclines (e.g., daunorubicin, doxorubicin), busulfan, cytarabine, paclitaxel, pentostatin, tamoxifen, trastuzumab -certain diuretics such as chlorothiazide, chlorthalidone, hydrochlorothiazide, indapamide, metolazone -certain medicines that treat or prevent blood clots like warfarin -certain muscle relaxants such as succinylcholine -cyclosporine -etanercept -indomethacin -medicines to increase blood counts  like filgrastim, pegfilgrastim, sargramostim -medicines used as general anesthesia -metronidazole -natalizumab This list may not describe all possible interactions. Give your health care provider a list of all the medicines, herbs, non-prescription drugs, or dietary supplements you use. Also tell them if you smoke, drink alcohol, or use illegal drugs. Some items may interact with your medicine. What should I watch for while using this medicine? Visit your doctor for checks on your progress. This drug may make you feel generally unwell. This is not uncommon, as chemotherapy can affect healthy cells as well as cancer cells. Report any side effects. Continue your course of treatment even though you feel ill unless your doctor tells you to stop. Drink water or other fluids as directed. Urinate often, even at night. In some cases, you may be given additional medicines to help with side effects. Follow all directions for their use. Call your doctor or health care professional for advice if you get a fever, chills or sore throat, or other symptoms of a cold or flu. Do not treat yourself. This drug decreases your body's ability to fight infections. Try to avoid being around people who are sick. This medicine may increase your risk to bruise or bleed. Call your doctor or health care professional if you notice any unusual bleeding. Be careful brushing and flossing your teeth or using a toothpick because you may get an infection or bleed more easily. If you have any dental work done, tell your dentist you are receiving this medicine. You may get drowsy or dizzy. Do not drive, use machinery, or  do anything that needs mental alertness until you know how this medicine affects you. Do not become pregnant while taking this medicine or for 1 year after stopping it. Women should inform their doctor if they wish to become pregnant or think they might be pregnant. Men should not father a child while taking this medicine  and for 4 months after stopping it. There is a potential for serious side effects to an unborn child. Talk to your health care professional or pharmacist for more information. Do not breast-feed an infant while taking this medicine. This medicine may interfere with the ability to have a child. This medicine has caused ovarian failure in some women. This medicine has caused reduced sperm counts in some men. You should talk with your doctor or health care professional if you are concerned about your fertility. If you are going to have surgery, tell your doctor or health care professional that you have taken this medicine. What side effects may I notice from receiving this medicine? Side effects that you should report to your doctor or health care professional as soon as possible: -allergic reactions like skin rash, itching or hives, swelling of the face, lips, or tongue -low blood counts - this medicine may decrease the number of white blood cells, red blood cells and platelets. You may be at increased risk for infections and bleeding. -signs of infection - fever or chills, cough, sore throat, pain or difficulty passing urine -signs of decreased platelets or bleeding - bruising, pinpoint red spots on the skin, black, tarry stools, blood in the urine -signs of decreased red blood cells - unusually weak or tired, fainting spells, lightheadedness -breathing problems -dark urine -dizziness -palpitations -swelling of the ankles, feet, hands -trouble passing urine or change in the amount of urine -weight gain -yellowing of the eyes or skin Side effects that usually do not require medical attention (report to your doctor or health care professional if they continue or are bothersome): -changes in nail or skin color -hair loss -missed menstrual periods -mouth sores -nausea, vomiting This list may not describe all possible side effects. Call your doctor for medical advice about side effects. You may  report side effects to FDA at 1-800-FDA-1088. Where should I keep my medicine? This drug is given in a hospital or clinic and will not be stored at home. NOTE: This sheet is a summary. It may not cover all possible information. If you have questions about this medicine, talk to your doctor, pharmacist, or health care provider.  2015, [employer]vier/Gold Standard. (2012-02-06 16:22:58)

## 2013-12-30 NOTE — Assessment & Plan Note (Signed)
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 Patient is clearly devastated hearing these results. I discussed with them that the treatment plan would need to change from definitive/curative chemotherapy to palliative treatment. Given the extent of the disease, I elected to use doublet chemotherapy with Taxotere and Cytoxan and dropped Adriamycin. Our goal is to control her primary cancer and see if we can resolve these lung metastases. If we're successful in that, there is a potential for her to still undergo mastectomy.  Distress screening: Patient is extremely distraught hearing these results but is keen on receiving treatment and going through with chemotherapy that is planned for today. I offered support services and counseled her.  Return to clinic in one week for toxicity check to see Mendel Ryder, our NP.

## 2013-12-31 ENCOUNTER — Ambulatory Visit (HOSPITAL_BASED_OUTPATIENT_CLINIC_OR_DEPARTMENT_OTHER): Payer: 59

## 2013-12-31 VITALS — BP 121/69 | HR 84 | Temp 98.1°F | Resp 20

## 2013-12-31 DIAGNOSIS — C50919 Malignant neoplasm of unspecified site of unspecified female breast: Secondary | ICD-10-CM

## 2013-12-31 DIAGNOSIS — Z5189 Encounter for other specified aftercare: Secondary | ICD-10-CM

## 2013-12-31 DIAGNOSIS — C50511 Malignant neoplasm of 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: Secondary | ICD-10-CM

## 2013-12-31 MED ORDER — PEGFILGRASTIM INJECTION 6 MG/0.6ML
6.0000 mg | Freq: Once | SUBCUTANEOUS | Status: AC
  Administered 2013-12-31: 6 mg via SUBCUTANEOUS

## 2013-12-31 NOTE — Progress Notes (Signed)
Patient is here today and very tearful.  Spent time with her reviewing medications - changed zofran to start today (no aloxi given yesterday) twice a day for 3 days and then prn.   Use compazine or ativan in-between zofran for breakthrough nausea.  Discussed good fluid intake.  Discussed neulasta injection rationale and what to expect.  Karen Douglas is with patient and husband using teach back method. Patient had no appts.  Will let Dr. Lindi Adie know.  Patient will call Tuesday if she has not heard about her appts.by then.   Discussed flu vaccine.  Will not give today since this is her first chemotherapy.   Patient and husband appreciated time spent with them and information.

## 2014-01-02 ENCOUNTER — Telehealth: Payer: Self-pay | Admitting: *Deleted

## 2014-01-02 NOTE — Telephone Encounter (Signed)
Message copied by Cherylynn Ridges on Mon Jan 02, 2014 11:49 AM ------      Message from: Otila Kluver      Created: Fri Dec 30, 2013  4:51 PM      Regarding: 1st time chemo      Contact: [phone]       !st time Taxol and Cytoxan  Had some /burning sensation in nasal passages towards end of Cytoxan. Felt fine before she left  Treatment area.            Drucie Ip, RN ------

## 2014-01-02 NOTE — Telephone Encounter (Signed)
Spoke with patient to schedule follow up post 1st treatment.  Confirmed appointment for 01/06/14 at 945am for labs and 1015 for Charlestine Massed, NP.

## 2014-01-02 NOTE — Telephone Encounter (Signed)
Called Karen Douglas for chemotherapy F/U.  Patient is doing well.  Denies n/v despite "bad taste in my mouth".  Denies any new side effects or symptoms.  Bladder is functioning well.  "I've never been a bowel person.  They move about every three days."  Eating and drinking well and I instructed to drink 64 oz minimum daily or at least the day before, of and after treatment.  Due to bowels instructed to drink more and try hot, decaffeinated tea, coffee, soups and broth  Denies questions at this time and encouraged to call if needed.  Reviewed how to call after hours in the case of an emergency.

## 2014-01-05 ENCOUNTER — Emergency Department (HOSPITAL_COMMUNITY)
Admission: EM | Admit: 2014-01-05 | Discharge: 2014-01-05 | Disposition: A | Discharge status: Home / Self Care | Payer: 59 | Attending: Emergency Medicine | Admitting: Emergency Medicine

## 2014-01-05 ENCOUNTER — Encounter (HOSPITAL_COMMUNITY): Payer: Self-pay | Admitting: Emergency Medicine

## 2014-01-05 DIAGNOSIS — M79604 Pain in right leg: Secondary | ICD-10-CM | POA: Diagnosis not present

## 2014-01-05 DIAGNOSIS — M79605 Pain in left leg: Secondary | ICD-10-CM | POA: Insufficient documentation

## 2014-01-05 DIAGNOSIS — Z79899 Other long term (current) drug therapy: Secondary | ICD-10-CM | POA: Diagnosis not present

## 2014-01-05 DIAGNOSIS — M5489 Other dorsalgia: Secondary | ICD-10-CM

## 2014-01-05 DIAGNOSIS — R1030 Lower abdominal pain, unspecified: Secondary | ICD-10-CM | POA: Diagnosis present

## 2014-01-05 DIAGNOSIS — Z853 Personal history of malignant neoplasm of breast: Secondary | ICD-10-CM | POA: Insufficient documentation

## 2014-01-05 DIAGNOSIS — M545 Low back pain: Secondary | ICD-10-CM | POA: Diagnosis not present

## 2014-01-05 NOTE — Discharge Instructions (Signed)
Followup with her doctors at the cancer center tomorrow Back Pain, Adult Low back pain is very common. About 1 in 5 people have back pain.The cause of low back pain is rarely dangerous. The pain often gets better over time.About half of people with a sudden onset of back pain feel better in just 2 weeks. About 8 in 10 people feel better by 6 weeks.  CAUSES Some common causes of back pain include:  Strain of the muscles or ligaments supporting the spine.  Wear and tear (degeneration) of the spinal discs.  Arthritis.  Direct injury to the back. DIAGNOSIS Most of the time, the direct cause of low back pain is not known.However, back pain can be treated effectively even when the exact cause of the pain is unknown.Answering your caregiver's questions about your overall health and symptoms is one of the most accurate ways to make sure the cause of your pain is not dangerous. If your caregiver needs more information, he or she may order lab work or imaging tests (X-rays or MRIs).However, even if imaging tests show changes in your back, this usually does not require surgery. HOME CARE INSTRUCTIONS For many people, back pain returns.Since low back pain is rarely dangerous, it is often a condition that people can learn to [facility] their own.   Remain active. It is stressful on the back to sit or stand in one place. Do not sit, drive, or stand in one place for more than 30 minutes at a time. Take short walks on level surfaces as soon as pain allows.Try to increase the length of time you walk each day.  Do not stay in bed.Resting more than 1 or 2 days can delay your recovery.  Do not avoid exercise or work.Your body is made to move.It is not dangerous to be active, even though your back may hurt.Your back will likely heal faster if you return to being active before your pain is gone.  Pay attention to your body when you bend and lift. Many people have less discomfortwhen lifting if they  bend their knees, keep the load close to their bodies,and avoid twisting. Often, the most comfortable positions are those that put less stress on your recovering back.  Find a comfortable position to sleep. Use a firm mattress and lie on your side with your knees slightly bent. If you lie on your back, put a pillow under your knees.  Only take over-the-counter or prescription medicines as directed by your caregiver. Over-the-counter medicines to reduce pain and inflammation are often the most helpful.Your caregiver may prescribe muscle relaxant drugs.These medicines help dull your pain so you can more quickly return to your normal activities and healthy exercise.  Put ice on the injured area.  Put ice in a plastic bag.  Place a towel between your skin and the bag.  Leave the ice on for 15-20 minutes, 03-04 times a day for the first 2 to 3 days. After that, ice and heat may be alternated to reduce pain and spasms.  Ask your caregiver about trying back exercises and gentle massage. This may be of some benefit.  Avoid feeling anxious or stressed.Stress increases muscle tension and can worsen back pain.It is important to recognize when you are anxious or stressed and learn ways to manage it.Exercise is a great option. SEEK MEDICAL CARE IF:  You have pain that is not relieved with rest or medicine.  You have pain that does not improve in 1 week.  You have new  symptoms.  You are generally not feeling well. SEEK IMMEDIATE MEDICAL CARE IF:   You have pain that radiates from your back into your legs.  You develop new bowel or bladder control problems.  You have unusual weakness or numbness in your arms or legs.  You develop nausea or vomiting.  You develop abdominal pain.  You feel faint. Document Released: 03/24/2005 Document Revised: 09/23/2011 Document Reviewed: 07/26/2013 [facility] Patient Information 2015 [place], [place]. This information is not intended to replace advice  given to you by your health care provider. Make sure you discuss any questions you have with your health care provider.

## 2014-01-05 NOTE — ED Notes (Signed)
Bed: WA04 Expected date:  Expected time:  Means of arrival:  Comments: EMS/pain

## 2014-01-05 NOTE — ED Provider Notes (Signed)
CSN: [id number]     Arrival date & time 01/05/14  2023 History   First MD Initiated Contact with Patient 01/05/14 2039     Chief Complaint  Patient presents with  . Leg Pain  . Flank Pain  . Groin Pain     (Consider location/radiation/quality/duration/timing/severity/associated sxs/prior Treatment) HPI Comments: Patient here complaining of increased lower back pain leading to both legs. Recently diagnosed with breast cancer and had a PET scan done on 9/25 which did not show any spinal metastasis. Patient notes that she did Miss a dose of her steroid this evening. EMS was called and patient given a dose of fentanyl and feels much better at this time. She denies any bowel or bladder dysfunction. Denies any saddle anesthesias. No numbness in her legs at this time. No recent history of trauma  Patient is a 58 y.o. female presenting with leg pain, flank pain, and groin pain. The history is provided by the patient and a relative.  Leg Pain Flank Pain  Groin Pain    Past Medical History  Diagnosis Date  . Headache   . Cancer     breast   Past Surgical History  Procedure Laterality Date  . Abdominal hysterectomy    . Finger surgery    . Portacath placement N/A 12/19/2013    Procedure: INSERTION PORT-A-CATH;  Surgeon: Autumn Messing III, MD;  Location: [facility];  Service: General;  Laterality: N/A;   History reviewed. No pertinent family history. History  Substance Use Topics  . Smoking status: Never Smoker   . Smokeless tobacco: Current User    Types: Snuff  . Alcohol Use: Yes     Comment: occasionally   OB History   Grav Para Term Preterm Abortions TAB SAB Ect Mult Living                 Review of Systems  Genitourinary: Positive for flank pain.  All other systems reviewed and are negative.     Allergies  Review of patient's allergies indicates no known allergies.  Home Medications   Prior to Admission medications   Medication Sig Start Date End Date Taking? Authorizing  Provider  atorvastatin (LIPITOR) 20 MG tablet Take 20 mg by mouth daily.   Yes Historical Provider, MD  dexamethasone (DECADRON) 4 MG tablet Take 2 tablets (8 mg total) by mouth 2 (two) times daily. Start the day before Taxotere. Take once the day after, then 2 times a day x 2d. 12/29/13   Rulon Eisenmenger, MD  lidocaine-prilocaine (EMLA) cream Apply 1 application topically as needed. 12/30/13   Rulon Eisenmenger, MD  LORazepam (ATIVAN) 0.5 MG tablet Take 1 tablet (0.5 mg total) by mouth every 6 (six) hours as needed (Nausea or vomiting). 12/29/13   Rulon Eisenmenger, MD  ondansetron (ZOFRAN) 8 MG tablet Take 1 tablet (8 mg total) by mouth 2 (two) times daily as needed (Nausea or vomiting). Begin 4 days after chemotherapy. 12/29/13   Rulon Eisenmenger, MD  oxyCODONE-acetaminophen (ROXICET) 5-325 MG per tablet Take 1-2 tablets by mouth every 4 (four) hours as needed. 12/19/13   Autumn Messing III, MD  prochlorperazine (COMPAZINE) 10 MG tablet Take 1 tablet (10 mg total) by mouth every 6 (six) hours as needed (Nausea or vomiting). 12/29/13   Rulon Eisenmenger, MD   There were no vitals taken for this visit. Physical Exam  Nursing note and vitals reviewed. Constitutional: She is oriented to person, place, and time. She appears well-developed and  well-nourished.  Non-toxic appearance. No distress.  HENT:  Head: Normocephalic and atraumatic.  Eyes: Conjunctivae, EOM and lids are normal. Pupils are equal, round, and reactive to light.  Neck: Normal range of motion. Neck supple. No tracheal deviation present. No mass present.  Cardiovascular: Normal rate, regular rhythm and normal heart sounds.  Exam reveals no gallop.   No murmur heard. Pulmonary/Chest: Effort normal and breath sounds normal. No stridor. No respiratory distress. She has no decreased breath sounds. She has no wheezes. She has no rhonchi. She has no rales.  Abdominal: Soft. Normal appearance and bowel sounds are normal. She exhibits no distension. There is no  tenderness. There is no rebound and no CVA tenderness.  Musculoskeletal: Normal range of motion. She exhibits no edema and no tenderness.  Neurological: She is alert and oriented to person, place, and time. She has normal strength. No cranial nerve deficit or sensory deficit. GCS eye subscore is 4. GCS verbal subscore is 5. GCS motor subscore is 6.  Skin: Skin is warm and dry. No abrasion and no rash noted.  Psychiatric: She has a normal mood and affect. Her speech is normal and behavior is normal.    ED Course  Procedures (including critical care time) Labs Review Labs Reviewed - No data to display  Imaging Review No results found.   EKG Interpretation None      MDM   Final diagnoses:  Other back pain    Patient neurologically intact at this time. No concern for cauda equina. She has full range of motion of lower extremities. Feels back to her baseline. She is stable for discharge return precautions given    Leota Jacobsen, MD 01/05/14 2108

## 2014-01-05 NOTE — ED Notes (Signed)
Pt ambulatory and moving all extremities equally. Neurologically intact.

## 2014-01-05 NOTE — ED Notes (Signed)
Per EMS- first chemo treatment this past Friday. Hx Breast CA. Sudden onset today of lower back and bilateral leg pain. Pain with movement and walking. No other symptoms with these. 20 G PIV in left hand. 150 mcg Fentanyl. Pain 10/10 to 6/10. VS: BP: 118/76 HR 78 RR 18 SpO2 97% on RA. GCS 15.

## 2014-01-05 NOTE — ED Notes (Signed)
Initial contact-A&Ox4. Moving all extremities equally. No c/o pain at this time. Reports pain as bilateral flank pain radiating to right groin and bilateral lower extremities. Was recently prescribed Decadron 4mg  twice a day. Missed morning dose this morning. Says she hasn't had this type of pain until missing Decadron dose. No other questions/concerns.

## 2014-01-06 ENCOUNTER — Ambulatory Visit (HOSPITAL_BASED_OUTPATIENT_CLINIC_OR_DEPARTMENT_OTHER): Payer: 59 | Admitting: Adult Health

## 2014-01-06 ENCOUNTER — Telehealth: Payer: Self-pay | Admitting: *Deleted

## 2014-01-06 ENCOUNTER — Telehealth: Payer: Self-pay | Admitting: Adult Health

## 2014-01-06 ENCOUNTER — Encounter: Payer: Self-pay | Admitting: Adult Health

## 2014-01-06 ENCOUNTER — Other Ambulatory Visit (HOSPITAL_BASED_OUTPATIENT_CLINIC_OR_DEPARTMENT_OTHER): Payer: 59

## 2014-01-06 VITALS — BP 112/61 | HR 79 | Temp 98.3°F | Resp 18 | Ht 61.0 in | Wt 195.4 lb

## 2014-01-06 DIAGNOSIS — C50511 Malignant neoplasm of 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: Secondary | ICD-10-CM

## 2014-01-06 DIAGNOSIS — M898X9 Other specified disorders of bone, unspecified site: Secondary | ICD-10-CM

## 2014-01-06 LAB — COMPREHENSIVE METABOLIC PANEL (CC13)
ALBUMIN: 3.4 g/dL — AB (ref 3.5–5.0)
ALK PHOS: 88 U/L (ref 40–150)
ALT: 12 U/L (ref 0–55)
AST: 13 U/L (ref 5–34)
Anion Gap: 9 mEq/L (ref 3–11)
BUN: 15 mg/dL (ref 7.0–26.0)
CALCIUM: 9.2 mg/dL (ref 8.4–10.4)
CHLORIDE: 107 meq/L (ref 98–109)
CO2: 23 mEq/L (ref 22–29)
Creatinine: 1 mg/dL (ref 0.6–1.1)
Glucose: 131 mg/dl (ref 70–140)
POTASSIUM: 4.4 meq/L (ref 3.5–5.1)
Sodium: 140 mEq/L (ref 136–145)
Total Bilirubin: 0.41 mg/dL (ref 0.20–1.20)
Total Protein: 7.3 g/dL (ref 6.4–8.3)

## 2014-01-06 LAB — CBC WITH DIFFERENTIAL/PLATELET
BASO%: 1.1 % (ref 0.0–2.0)
BASOS ABS: 0.1 10*3/uL (ref 0.0–0.1)
EOS%: 0.2 % (ref 0.0–7.0)
Eosinophils Absolute: 0 10*3/uL (ref 0.0–0.5)
HCT: 37.2 % (ref 34.8–46.6)
HEMOGLOBIN: 12.4 g/dL (ref 11.6–15.9)
LYMPH#: 2 10*3/uL (ref 0.9–3.3)
LYMPH%: 22.1 % (ref 14.0–49.7)
MCH: 28.6 pg (ref 25.1–34.0)
MCHC: 33.3 g/dL (ref 31.5–36.0)
MCV: 85.7 fL (ref 79.5–101.0)
MONO#: 2.9 10*3/uL — ABNORMAL HIGH (ref 0.1–0.9)
MONO%: 31.4 % — ABNORMAL HIGH (ref 0.0–14.0)
NEUT#: 4.2 10*3/uL (ref 1.5–6.5)
NEUT%: 45.2 % (ref 38.4–76.8)
PLATELETS: 214 10*3/uL (ref 145–400)
RBC: 4.34 10*6/uL (ref 3.70–5.45)
RDW: 14.9 % — ABNORMAL HIGH (ref 11.2–14.5)
WBC: 9.2 10*3/uL (ref 3.9–10.3)
nRBC: 0 % (ref 0–0)

## 2014-01-06 MED ORDER — OXYCODONE-ACETAMINOPHEN 5-325 MG PO TABS: 1.0000 | ORAL_TABLET | ORAL | Status: DC | PRN

## 2014-01-06 NOTE — Telephone Encounter (Signed)
Per staff message and POF I have scheduled appts. Advised scheduler of appts. JMW  

## 2014-01-06 NOTE — Patient Instructions (Addendum)
Nausea medication  Decadron/Dexamethasone: take two tablets the day before chemotherapy, no tablets on chemotherapy day, two tablets once the day after chemotherapy, then take two tablets twice a day for two days.    Ondansetron/Zofran: take one tablet twice a day starting the day after chemotherapy for three days.    Prochlorperazine/Compazine: Take every 6 hours as needed for nausea  Lorazepam/Ativan: Take every 6 hours as needed for nausea.  This medication will make you sleepy.  I recommend this as a good night time nausea medication.    Take Claritin 10mg  starting the day after chemotherapy with Tylenol 500mg .  Take these daily for a week starting the morning before you receive your neulasta injection.  If you have pain despite this, take one percocet tablet.  You can take one every 4 hours.  The percocet can make you sleepy, so do not drive after taking it, and don't take it near the time you take the Lorazepam.    Acetaminophen; Oxycodone tablets What is this medicine? ACETAMINOPHEN; OXYCODONE (a set a MEE noe fen; ox i KOE done) is a pain reliever. It is used to treat mild to moderate pain. This medicine may be used for other purposes; ask your health care provider or pharmacist if you have questions. COMMON BRAND NAME(S): Endocet, Magnacet, Narvox, Percocet, Perloxx, Primalev, Primlev, Roxicet, Xolox What should I tell my health care provider before I take this medicine? They need to know if you have any of these conditions: -brain tumor -Crohn's disease, inflammatory bowel disease, or ulcerative colitis -drug abuse or addiction -head injury -heart or circulation problems -if you often drink alcohol -kidney disease or problems going to the bathroom -liver disease -lung disease, asthma, or breathing problems -an unusual or allergic reaction to acetaminophen, oxycodone, other opioid analgesics, other medicines, foods, dyes, or preservatives -pregnant or trying to get  pregnant -breast-feeding How should I use this medicine? Take this medicine by mouth with a full glass of water. Follow the directions on the prescription label. Take your medicine at regular intervals. Do not take your medicine more often than directed. Talk to your pediatrician regarding the use of this medicine in children. Special care may be needed. Patients over 44 years old may have a stronger reaction and need a smaller dose. Overdosage: If you think you have taken too much of this medicine contact a poison control center or emergency room at once. NOTE: This medicine is only for you. Do not share this medicine with others. What if I miss a dose? If you miss a dose, take it as soon as you can. If it is almost time for your next dose, take only that dose. Do not take double or extra doses. What may interact with this medicine? -alcohol -antihistamines -barbiturates like amobarbital, butalbital, butabarbital, methohexital, pentobarbital, phenobarbital, thiopental, and secobarbital -benztropine -drugs for bladder problems like solifenacin, trospium, oxybutynin, tolterodine, hyoscyamine, and methscopolamine -drugs for breathing problems like ipratropium and tiotropium -drugs for certain stomach or intestine problems like propantheline, homatropine methylbromide, glycopyrrolate, atropine, belladonna, and dicyclomine -general anesthetics like etomidate, ketamine, nitrous oxide, propofol, desflurane, enflurane, halothane, isoflurane, and sevoflurane -medicines for depression, anxiety, or psychotic disturbances -medicines for sleep -muscle relaxants -naltrexone -narcotic medicines (opiates) for pain -phenothiazines like perphenazine, thioridazine, chlorpromazine, mesoridazine, fluphenazine, prochlorperazine, promazine, and trifluoperazine -scopolamine -tramadol -trihexyphenidyl This list may not describe all possible interactions. Give your health care provider a list of all the  medicines, herbs, non-prescription drugs, or dietary supplements you use. Also tell them if  you smoke, drink alcohol, or use illegal drugs. Some items may interact with your medicine. What should I watch for while using this medicine? Tell your doctor or health care professional if your pain does not go away, if it gets worse, or if you have new or a different type of pain. You may develop tolerance to the medicine. Tolerance means that you will need a higher dose of the medication for pain relief. Tolerance is normal and is expected if you take this medicine for a long time. Do not suddenly stop taking your medicine because you may develop a severe reaction. Your body becomes used to the medicine. This does NOT mean you are addicted. Addiction is a behavior related to getting and using a drug for a non-medical reason. If you have pain, you have a medical reason to take pain medicine. Your doctor will tell you how much medicine to take. If your doctor wants you to stop the medicine, the dose will be slowly lowered over time to avoid any side effects. You may get drowsy or dizzy. Do not drive, use machinery, or do anything that needs mental alertness until you know how this medicine affects you. Do not stand or sit up quickly, especially if you are an older patient. This reduces the risk of dizzy or fainting spells. Alcohol may interfere with the effect of this medicine. Avoid alcoholic drinks. There are different types of narcotic medicines (opiates) for pain. If you take more than one type at the same time, you may have more side effects. Give your health care provider a list of all medicines you use. Your doctor will tell you how much medicine to take. Do not take more medicine than directed. Call emergency for help if you have problems breathing. The medicine will cause constipation. Try to have a bowel movement at least every 2 to 3 days. If you do not have a bowel movement for 3 days, call your doctor or  health care professional. Do not take Tylenol (acetaminophen) or medicines that have acetaminophen with this medicine. Too much acetaminophen can be very dangerous. Many nonprescription medicines contain acetaminophen. Always read the labels carefully to avoid taking more acetaminophen. What side effects may I notice from receiving this medicine? Side effects that you should report to your doctor or health care professional as soon as possible: -allergic reactions like skin rash, itching or hives, swelling of the face, lips, or tongue -breathing difficulties, wheezing -confusion -light headedness or fainting spells -severe stomach pain -unusually weak or tired -yellowing of the skin or the whites of the eyes Side effects that usually do not require medical attention (report to your doctor or health care professional if they continue or are bothersome): -dizziness -drowsiness -nausea -vomiting This list may not describe all possible side effects. Call your doctor for medical advice about side effects. You may report side effects to FDA at 1-800-FDA-1088. Where should I keep my medicine? Keep out of the reach of children. This medicine can be abused. Keep your medicine in a safe place to protect it from theft. Do not share this medicine with anyone. Selling or giving away this medicine is dangerous and against the law. Store at room temperature between 20 and 25 degrees C (68 and 77 degrees F). Keep container tightly closed. Protect from light. This medicine may cause accidental overdose and death if it is taken by other adults, children, or pets. Flush any unused medicine down the toilet to reduce the chance of harm.  Do not use the medicine after the expiration date. NOTE: This sheet is a summary. It may not cover all possible information. If you have questions about this medicine, talk to your doctor, pharmacist, or health care provider.  2015, Elsevier/Gold Standard. (2012-11-15  13:17:35)

## 2014-01-06 NOTE — Telephone Encounter (Signed)
per pof to sch pt appt-gave pt copy odf sch-sent MW emailt o sch trmt

## 2014-01-06 NOTE — Progress Notes (Signed)
Patient Care Team: Karen Beard, MD as PCP - General (Family Medicine) Autumn Messing III, MD as Consulting Physician (General Surgery) Rulon Eisenmenger, MD as Consulting Physician (Hematology and Oncology) Rexene Edison, MD as Consulting Physician (Radiation Oncology)  DIAGNOSIS: Breast cancer of 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   Primary site: Breast (Right)   Staging method: AJCC 7th Edition   Clinical: Stage IV (T4b, N1, M1) signed by Rulon Eisenmenger, MD on 12/30/2013  9:08 AM   Summary: Stage IV (T4b, N1, M1)   SUMMARY OF ONCOLOGIC HISTORY:   Breast cancer of 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   12/01/2013 Initial Diagnosis Breast cancer of 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   12/06/2013 Breast MRI Enhancing tumor throughout the right breast all quadrants with diffuse skin thickening 11.5 x 11 x 5 cm: Spiculated mass posterior UOQ right breast 2.6 x 2.5 x 2 cm and abuts pectoralis muscle: 2 abnormal axillary lymph nodes largest 2.2 cm    CHIEF COMPLIANT: Patient is here for chemotherapy  INTERVAL HISTORY: Karen Douglas is a 58 year old African American lady with above-mentioned history of 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 She is here for follow up after receiving Taxotere and Cytoxan last week.  She didn't notice any large amounts of difficulty after the chemotherapy.  She did experience some constipation and took Senokot and that did help her constipation.  She was taking Dexamethasone 8mg  BID daily and didn't realize that she was supposed to stop.  She called Varney Biles, our navigator and Varney Biles told her that she didn't need them if she wasn't nauseated.  She didn't take the Dexamethasone, however she experienced an increase in pain in her hips and back and had difficulty walking.  This began at 4pm and worsened by 7pm.  She went to the ED. In the ambulance she was given a dose of Fentanyl and the pain has since subsided.     REVIEW OF SYSTEMS:   A 10  point review of systems was conducted and is otherwise negative except for what is noted above.    Past Medical History  Diagnosis Date  . Headache   . Cancer     breast   Past Surgical History  Procedure Laterality Date  . Abdominal hysterectomy    . Finger surgery    . Portacath placement N/A 12/19/2013    Procedure: INSERTION PORT-A-CATH;  Surgeon: Autumn Messing III, MD;  Location: [facility];  Service: General;  Laterality: N/A;   No family history on file. History   Social History  . Marital Status: Married    Spouse Name: N/A    Number of Children: N/A  . Years of Education: N/A   Occupational History  . Not on file.   Social History Main Topics  . Smoking status: Never Smoker   . Smokeless tobacco: Current User    Types: Snuff  . Alcohol Use: Yes     Comment: occasionally  . Drug Use: No  . Sexual Activity: Not on file   Other Topics Concern  . Not on file   Social History Narrative  . No narrative on file    ALLERGIES:  has No Known Allergies.  MEDICATIONS:  Current Outpatient Prescriptions  Medication Sig Dispense Refill  . atorvastatin (LIPITOR) 20 MG tablet Take 20 mg by mouth daily.      Marland Kitchen dexamethasone (DECADRON) 4 MG tablet Take 2 tablets (8 mg total) by mouth 2 (two) times daily. Start the day  before Taxotere. Take once the day after, then 2 times a day x 2d.  30 tablet  1  . lidocaine-prilocaine (EMLA) cream Apply 1 application topically as needed.  30 g  0  . LORazepam (ATIVAN) 0.5 MG tablet Take 1 tablet (0.5 mg total) by mouth every 6 (six) hours as needed (Nausea or vomiting).  30 tablet  0  . ondansetron (ZOFRAN) 8 MG tablet Take 1 tablet (8 mg total) by mouth 2 (two) times daily as needed (Nausea or vomiting). Begin 4 days after chemotherapy.  30 tablet  1  . oxyCODONE-acetaminophen (PERCOCET/ROXICET) 5-325 MG per tablet Take 1 tablet by mouth every 4 (four) hours as needed for severe pain.  30 tablet  0  . prochlorperazine (COMPAZINE) 10 MG tablet  Take 1 tablet (10 mg total) by mouth every 6 (six) hours as needed (Nausea or vomiting).  30 tablet  1   No current facility-administered medications for this visit.    PHYSICAL EXAMINATION: ECOG PERFORMANCE STATUS: 1 - Symptomatic but completely ambulatory  Filed Vitals:   01/06/14 0944  BP: 112/61  Pulse: 79  Temp: 98.3 F (36.8 C)  Resp: 18   Filed Weights   01/06/14 0944  Weight: 195 lb 6.4 oz (88.633 kg)    GENERAL: Patient is a well appearing female in no acute distress HEENT:  Sclerae anicteric.  Oropharynx clear and moist. No ulcerations or evidence of oropharyngeal candidiasis. Neck is supple.  NODES:  No cervical, supraclavicular, or axillary lymphadenopathy palpated.  BREAST EXAM:  Deferred. LUNGS:  Clear to auscultation bilaterally.  No wheezes or rhonchi. HEART:  Regular rate and rhythm. No murmur appreciated. ABDOMEN:  Soft, nontender.  Positive, normoactive bowel sounds. No organomegaly palpated. MSK:  No focal spinal tenderness to palpation. Full range of motion bilaterally in the upper extremities. EXTREMITIES:  No peripheral edema.   SKIN:  Clear with no obvious rashes or skin changes. No nail dyscrasia. NEURO:  Nonfocal. Well oriented.  Appropriate affect.    LABORATORY DATA:  I have reviewed the data as listed   Chemistry      Component Value Date/Time   NA 140 01/06/2014 0924   K 4.4 01/06/2014 0924   CO2 23 01/06/2014 0924   BUN 15.0 01/06/2014 0924   CREATININE 1.0 01/06/2014 0924      Component Value Date/Time   CALCIUM 9.2 01/06/2014 0924   ALKPHOS 88 01/06/2014 0924   AST 13 01/06/2014 0924   ALT 12 01/06/2014 0924   BILITOT 0.41 01/06/2014 0924       Lab Results  Component Value Date   WBC 9.2 01/06/2014   HGB 12.4 01/06/2014   HCT 37.2 01/06/2014   MCV 85.7 01/06/2014   PLT 214 01/06/2014   NEUTROABS 4.2 01/06/2014     RADIOGRAPHIC STUDIES: I have personally reviewed the radiology reports and agreed with their findings. No results  found. PET CT scan results showed an extremely large right breast mass with right axillary, right lung hilar and mediastinal lymphadenopathy that is very hypermetabolic, left ninth rib lesion probably posttraumatic. 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    1. Chemotherapy with Taxotere and Cytoxan beginning 12/30/13.  Patient receives this on day 1 of a 21 day cycle with Neualsta given on day 2.  She will receive a total of 6 cycles with CT chest and mammo/ultrasound after cycle 3.   PLAN:   Antonea is doing well today.  She  seems to have tolerated cycle 1 of Taxotere and Cytoxan relatively well.  She did go to the ED in pain that was related the the Neulasta.  I counseled her on taking Claritin daily following chemotherapy with Tylenol.  I also prescribed percocet #30 to take for bone pain not relieved with this intervention.  The patient was given detailed written instructions about her anti-emetic regimen, the claritin, and about Percocet.  She and her family voiced understanding of the instructions that I reviewed with her in detail.  I requested scheduling of her next several treatment and appointments.  I reviewed the patient with Dr. Lindi Adie who would like for her to be scanned via CT chest after cycle 3 and undergo mammo and ultrasound at that point as well.  The patient verbalized understanding of the plan and insturctions.     The patient has a good understanding of the overall plan. she agrees with it. She will call with any problems that may develop before her next visit here.  I spent 25 minutes counseling the patient face to face. The total time spent in the appointment was 30 minutes and more than 50% was on counseling and review of test results   Minette Headland, [place] [phone]  01/06/2014 4:03 PM

## 2014-01-20 ENCOUNTER — Ambulatory Visit (HOSPITAL_COMMUNITY)
Admission: RE | Admit: 2014-01-20 | Discharge: 2014-01-20 | Disposition: A | Discharge status: Home / Self Care | Payer: 59 | Source: Ambulatory Visit | Attending: Adult Health | Admitting: Adult Health

## 2014-01-20 ENCOUNTER — Ambulatory Visit (HOSPITAL_BASED_OUTPATIENT_CLINIC_OR_DEPARTMENT_OTHER): Payer: 59

## 2014-01-20 ENCOUNTER — Other Ambulatory Visit: Payer: Self-pay | Admitting: Hematology and Oncology

## 2014-01-20 ENCOUNTER — Ambulatory Visit (HOSPITAL_BASED_OUTPATIENT_CLINIC_OR_DEPARTMENT_OTHER): Payer: 59 | Admitting: Adult Health

## 2014-01-20 ENCOUNTER — Other Ambulatory Visit (HOSPITAL_BASED_OUTPATIENT_CLINIC_OR_DEPARTMENT_OTHER): Payer: 59

## 2014-01-20 ENCOUNTER — Encounter: Payer: Self-pay | Admitting: Adult Health

## 2014-01-20 VITALS — BP 111/72 | HR 73 | Temp 98.6°F | Resp 20 | Ht 61.0 in | Wt 197.3 lb

## 2014-01-20 DIAGNOSIS — M542 Cervicalgia: Secondary | ICD-10-CM

## 2014-01-20 DIAGNOSIS — C7802 Secondary malignant neoplasm of left lung: Secondary | ICD-10-CM

## 2014-01-20 DIAGNOSIS — C773 Secondary and unspecified malignant neoplasm of axilla and upper limb lymph nodes: Secondary | ICD-10-CM

## 2014-01-20 DIAGNOSIS — C7951 Secondary malignant neoplasm of bone: Secondary | ICD-10-CM

## 2014-01-20 DIAGNOSIS — C50511 Malignant neoplasm of 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: Secondary | ICD-10-CM

## 2014-01-20 DIAGNOSIS — Z5111 Encounter for antineoplastic chemotherapy: Secondary | ICD-10-CM

## 2014-01-20 DIAGNOSIS — C7801 Secondary malignant neoplasm of right lung: Secondary | ICD-10-CM

## 2014-01-20 LAB — COMPREHENSIVE METABOLIC PANEL (CC13)
ALK PHOS: 77 U/L (ref 40–150)
ALT: 13 U/L (ref 0–55)
ANION GAP: 9 meq/L (ref 3–11)
AST: 10 U/L (ref 5–34)
Albumin: 3.4 g/dL — ABNORMAL LOW (ref 3.5–5.0)
BUN: 13.6 mg/dL (ref 7.0–26.0)
CO2: 21 meq/L — AB (ref 22–29)
Calcium: 9.6 mg/dL (ref 8.4–10.4)
Chloride: 111 mEq/L — ABNORMAL HIGH (ref 98–109)
Creatinine: 0.8 mg/dL (ref 0.6–1.1)
GLUCOSE: 111 mg/dL (ref 70–140)
POTASSIUM: 4.2 meq/L (ref 3.5–5.1)
SODIUM: 141 meq/L (ref 136–145)
TOTAL PROTEIN: 6.9 g/dL (ref 6.4–8.3)
Total Bilirubin: 0.32 mg/dL (ref 0.20–1.20)

## 2014-01-20 LAB — CBC WITH DIFFERENTIAL/PLATELET
BASO%: 1.2 % (ref 0.0–2.0)
Basophils Absolute: 0.1 10*3/uL (ref 0.0–0.1)
EOS ABS: 0 10*3/uL (ref 0.0–0.5)
EOS%: 0 % (ref 0.0–7.0)
HCT: 35.6 % (ref 34.8–46.6)
HGB: 11.5 g/dL — ABNORMAL LOW (ref 11.6–15.9)
LYMPH%: 9.6 % — ABNORMAL LOW (ref 14.0–49.7)
MCH: 27.9 pg (ref 25.1–34.0)
MCHC: 32.3 g/dL (ref 31.5–36.0)
MCV: 86.3 fL (ref 79.5–101.0)
MONO#: 0.7 10*3/uL (ref 0.1–0.9)
MONO%: 6.1 % (ref 0.0–14.0)
NEUT%: 83.1 % — ABNORMAL HIGH (ref 38.4–76.8)
NEUTROS ABS: 9 10*3/uL — AB (ref 1.5–6.5)
Platelets: 283 10*3/uL (ref 145–400)
RBC: 4.12 10*6/uL (ref 3.70–5.45)
RDW: 15.1 % — AB (ref 11.2–14.5)
WBC: 10.9 10*3/uL — AB (ref 3.9–10.3)
lymph#: 1 10*3/uL (ref 0.9–3.3)

## 2014-01-20 MED ORDER — SODIUM CHLORIDE 0.9 % IV SOLN
600.0000 mg/m2 | Freq: Once | INTRAVENOUS | Status: AC
  Administered 2014-01-20: 1180 mg via INTRAVENOUS
  Filled 2014-01-20: qty 59

## 2014-01-20 MED ORDER — ONDANSETRON 16 MG/50ML IVPB (CHCC)
INTRAVENOUS | Status: AC
  Filled 2014-01-20: qty 16

## 2014-01-20 MED ORDER — SODIUM CHLORIDE 0.9 % IJ SOLN
10.0000 mL | INTRAMUSCULAR | Status: DC | PRN
  Administered 2014-01-20: 10 mL
  Filled 2014-01-20: qty 10

## 2014-01-20 MED ORDER — SODIUM CHLORIDE 0.9 % IV SOLN
Freq: Once | INTRAVENOUS | Status: AC
  Administered 2014-01-20: 10:00:00 via INTRAVENOUS

## 2014-01-20 MED ORDER — ONDANSETRON 16 MG/50ML IVPB (CHCC)
16.0000 mg | Freq: Once | INTRAVENOUS | Status: AC
  Administered 2014-01-20: 16 mg via INTRAVENOUS

## 2014-01-20 MED ORDER — DEXAMETHASONE SODIUM PHOSPHATE 20 MG/5ML IJ SOLN
INTRAMUSCULAR | Status: AC
  Filled 2014-01-20: qty 5

## 2014-01-20 MED ORDER — DEXAMETHASONE SODIUM PHOSPHATE 20 MG/5ML IJ SOLN
20.0000 mg | Freq: Once | INTRAMUSCULAR | Status: AC
  Administered 2014-01-20: 20 mg via INTRAVENOUS

## 2014-01-20 MED ORDER — HEPARIN SOD (PORK) LOCK FLUSH 100 UNIT/ML IV SOLN
500.0000 [IU] | Freq: Once | INTRAVENOUS | Status: AC | PRN
  Administered 2014-01-20: 500 [IU]
  Filled 2014-01-20: qty 5

## 2014-01-20 MED ORDER — DOCETAXEL CHEMO INJECTION 160 MG/16ML
75.0000 mg/m2 | Freq: Once | INTRAVENOUS | Status: AC
  Administered 2014-01-20: 150 mg via INTRAVENOUS
  Filled 2014-01-20: qty 15

## 2014-01-20 NOTE — Patient Instructions (Signed)
[facility] Discharge Instructions for Patients Receiving Chemotherapy  Today you received the following chemotherapy agents: Taxotere and Cytoxan.  To help prevent nausea and vomiting after your treatment, we encourage you to take your nausea medication: Compazine 10 mg every 6 hours and Zofran 8 mg every 12 hours as needed.   If you develop nausea and vomiting that is not controlled by your nausea medication, call the clinic.   BELOW ARE SYMPTOMS THAT SHOULD BE REPORTED IMMEDIATELY:  *FEVER GREATER THAN 100.5 F  *CHILLS WITH OR WITHOUT FEVER  NAUSEA AND VOMITING THAT IS NOT CONTROLLED WITH YOUR NAUSEA MEDICATION  *UNUSUAL SHORTNESS OF BREATH  *UNUSUAL BRUISING OR BLEEDING  TENDERNESS IN MOUTH AND THROAT WITH OR WITHOUT PRESENCE OF ULCERS  *URINARY PROBLEMS  *BOWEL PROBLEMS  UNUSUAL RASH Items with * indicate a potential emergency and should be followed up as soon as possible.  Feel free to call the clinic you have any questions or concerns. The clinic phone number is [phone].

## 2014-01-20 NOTE — Progress Notes (Signed)
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 Diffuse hypermetabolic right breast inflammatory carcinoma.  Metastatic lymphadenopathy in the right axilla, mediastinum, and bilateral hilar regions, small bilateral pulmonary metastases, possible 9th rib metastases   12/30/2013 -  Chemotherapy Taxotere and cytoxan given on day 1 of a 21 day cycle with neulasta given on day 2 for granulocyte support.  A total of 6 cycles are planned with CT chest and mammo/ultrasound planned after cycle 3.     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 She is here for cycle 2 day 1 of Taxotere and Cytoxan.  She does have some pain in her neck that has been longstanding and prior to diagnosis, however it has recently worsened.  This pain is intermittent and is worse at night.  She was previously  told it was due to computer use.  She does have constipation and manages this with a stool softener.  This constipation is a longstanding problem.  Her hair did fall out and she has a couple of "bumps" on her scalp now.  She denies fevers, chills, nausea, vomiting, diarrhea, numbness/tingling, skin changes, or any further concerns.     REVIEW OF SYSTEMS:   A 10 point review of systems was conducted and is otherwise negative except for what is noted above.    Past Medical History  Diagnosis Date  . Headache   . Cancer     breast   Past Surgical History  Procedure Laterality Date  . Abdominal hysterectomy    . Finger surgery    . Portacath placement N/A 12/19/2013    Procedure: INSERTION PORT-A-CATH;  Surgeon: Autumn Messing III, MD;  Location: [facility];  Service: General;  Laterality: N/A;   History reviewed. No pertinent family history. History   Social History  . Marital Status: Married    Spouse Name: N/A    Number of Children: N/A  . Years of Education: N/A   Occupational History  . Not on file.   Social History Main Topics  . Smoking status: Never Smoker   . Smokeless tobacco: Current User    Types: Snuff  . Alcohol Use: Yes     Comment: occasionally  . Drug Use: No  . Sexual Activity: Not on file   Other Topics Concern  . Not on file   Social History Narrative  . No narrative on  file    ALLERGIES:  has No Known Allergies.  MEDICATIONS:  Current Outpatient Prescriptions  Medication Sig Dispense Refill  . atorvastatin (LIPITOR) 20 MG tablet Take 20 mg by mouth daily.      Marland Kitchen dexamethasone (DECADRON) 4 MG tablet Take 2 tablets (8 mg total) by mouth 2 (two) times daily. Start the day before Taxotere. Take once the day after, then 2 times a day x 2d.  30 tablet  1  . lidocaine-prilocaine (EMLA) cream Apply 1 application topically as needed.  30 g  0  . LORazepam (ATIVAN) 0.5 MG tablet Take 1 tablet (0.5 mg total) by mouth every 6 (six) hours as needed (Nausea or  vomiting).  30 tablet  0  . ondansetron (ZOFRAN) 8 MG tablet Take 1 tablet (8 mg total) by mouth 2 (two) times daily as needed (Nausea or vomiting). Begin 4 days after chemotherapy.  30 tablet  1  . prochlorperazine (COMPAZINE) 10 MG tablet Take 1 tablet (10 mg total) by mouth every 6 (six) hours as needed (Nausea or vomiting).  30 tablet  1  . oxyCODONE-acetaminophen (PERCOCET/ROXICET) 5-325 MG per tablet Take 1 tablet by mouth every 4 (four) hours as needed for severe pain.  30 tablet  0   No current facility-administered medications for this visit.    PHYSICAL EXAMINATION: ECOG PERFORMANCE STATUS: 1 - Symptomatic but completely ambulatory  Filed Vitals:   01/20/14 0841  BP: 111/72  Pulse: 73  Temp: 98.6 F (37 C)  Resp: 20   Filed Weights   01/20/14 0841  Weight: 197 lb 4.8 oz (89.495 kg)    GENERAL: Patient is a well appearing female in no acute distress HEENT:  Sclerae anicteric.  Oropharynx clear and moist. No ulcerations or evidence of oropharyngeal candidiasis. Neck is supple.  NODES:  No cervical, supraclavicular, or axillary lymphadenopathy palpated.  BREAST EXAM:  Deferred. LUNGS:  Clear to auscultation bilaterally.  No wheezes or rhonchi. HEART:  Regular rate and rhythm. No murmur appreciated. ABDOMEN:  Soft, nontender.  Positive, normoactive bowel sounds. No organomegaly palpated. MSK:  No focal spinal tenderness to palpation. Full range of motion bilaterally in the upper extremities. EXTREMITIES:  No peripheral edema.   SKIN:  Small erythematous lesions on scalp, about 4, No nail dyscrasia. NEURO:  Nonfocal. Well oriented.  Appropriate affect.    LABORATORY DATA:  I have reviewed the data as listed   Chemistry      Component Value Date/Time   NA 141 01/20/2014 0752   K 4.2 01/20/2014 0752   CO2 21* 01/20/2014 0752   BUN 13.6 01/20/2014 0752   CREATININE 0.8 01/20/2014 0752      Component Value Date/Time   CALCIUM 9.6 01/20/2014 0752   ALKPHOS 77  01/20/2014 0752   AST 10 01/20/2014 0752   ALT 13 01/20/2014 0752   BILITOT 0.32 01/20/2014 0752       Lab Results  Component Value Date   WBC 10.9* 01/20/2014   HGB 11.5* 01/20/2014   HCT 35.6 01/20/2014   MCV 86.3 01/20/2014   PLT 283 01/20/2014   NEUTROABS 9.0* 01/20/2014     RADIOGRAPHIC STUDIES: I have personally reviewed the radiology reports and agreed with their findings. No results found. PET CT scan results showed an extremely large right breast mass with right axillary, right lung hilar and mediastinal lymphadenopathy that is very hypermetabolic, left ninth rib lesion probably posttraumatic. 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    1. Chemotherapy with Taxotere and Cytoxan beginning 12/30/13.  Patient receives this on day 1 of a 21 day cycle with Neualsta given on day 2.  She will receive a total of 6 cycles with CT chest and mammo/ultrasound after cycle 3.   PLAN:   Karen Douglas is doing well today.  I reviewed her lab work with her in detail.  It is stable.  She does have longstanding constipation that she takes colace for.  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  I recommended warm compresses and antibiotic ointment.  This is very mild.  Should it worsen she knows to call us.    Due to her neck pain I ordered cervical x rays to evaluate.  This is likely musculoskeletal in nature.    Karen Douglas will return tomorrow for Neulasta and in one week for labs and evaluation for any chemo toxicities.    The patient has a good understanding of the overall plan. she agrees with it. She will call with any problems that may develop before her next visit here.  I spent 25 minutes counseling the patient face to face. The total time spent in the appointment was 30 minutes and more than 50% was on counseling and review of test results   Minette Headland, [place] [phone] 01/20/2014 9:03 AM

## 2014-01-21 ENCOUNTER — Ambulatory Visit (HOSPITAL_BASED_OUTPATIENT_CLINIC_OR_DEPARTMENT_OTHER): Payer: 59

## 2014-01-21 VITALS — BP 123/85 | HR 85 | Temp 97.8°F | Resp 20

## 2014-01-21 DIAGNOSIS — C50511 Malignant neoplasm of 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: Secondary | ICD-10-CM

## 2014-01-21 DIAGNOSIS — Z5189 Encounter for other specified aftercare: Secondary | ICD-10-CM

## 2014-01-21 MED ORDER — PEGFILGRASTIM INJECTION 6 MG/0.6ML
6.0000 mg | Freq: Once | SUBCUTANEOUS | Status: AC
  Administered 2014-01-21: 6 mg via SUBCUTANEOUS

## 2014-01-23 ENCOUNTER — Encounter: Payer: Self-pay | Admitting: Hematology and Oncology

## 2014-01-23 ENCOUNTER — Telehealth: Payer: Self-pay | Admitting: *Deleted

## 2014-01-23 NOTE — Progress Notes (Signed)
Put disability form on nurse's desk. °

## 2014-01-23 NOTE — Telephone Encounter (Signed)
Called pt to inform her of test results concerning her neck. Communicated to pt that she has no metastatic disease in that area but does have some degeneration in the spine. Explained to pt that this could be due to aging and advised her to spend less time at computer since she mentioned this puts a strain on her neck. Pt agreed. Pt verbalized understanding and thanked [place] for the call. Message to be forwarded to [place].

## 2014-01-25 ENCOUNTER — Encounter: Payer: Self-pay | Admitting: Hematology and Oncology

## 2014-01-25 NOTE — Progress Notes (Signed)
Put disability form in registration desk. °

## 2014-01-27 ENCOUNTER — Telehealth: Payer: Self-pay | Admitting: *Deleted

## 2014-01-27 ENCOUNTER — Other Ambulatory Visit (HOSPITAL_BASED_OUTPATIENT_CLINIC_OR_DEPARTMENT_OTHER): Payer: 59

## 2014-01-27 ENCOUNTER — Telehealth: Payer: Self-pay | Admitting: Hematology and Oncology

## 2014-01-27 ENCOUNTER — Ambulatory Visit (HOSPITAL_BASED_OUTPATIENT_CLINIC_OR_DEPARTMENT_OTHER): Payer: 59 | Admitting: Hematology and Oncology

## 2014-01-27 VITALS — BP 102/63 | HR 100 | Temp 98.5°F | Resp 20 | Ht 61.0 in | Wt 199.3 lb

## 2014-01-27 DIAGNOSIS — C7951 Secondary malignant neoplasm of bone: Secondary | ICD-10-CM

## 2014-01-27 DIAGNOSIS — C50811 Malignant neoplasm of overlapping sites of right female breast: Secondary | ICD-10-CM

## 2014-01-27 DIAGNOSIS — C50511 Malignant neoplasm of 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: Secondary | ICD-10-CM

## 2014-01-27 DIAGNOSIS — C773 Secondary and unspecified malignant neoplasm of axilla and upper limb lymph nodes: Secondary | ICD-10-CM

## 2014-01-27 DIAGNOSIS — Z170 Estrogen receptor positive status [ER+]: Secondary | ICD-10-CM

## 2014-01-27 LAB — COMPREHENSIVE METABOLIC PANEL (CC13)
ALBUMIN: 3.1 g/dL — AB (ref 3.5–5.0)
ALK PHOS: 82 U/L (ref 40–150)
ALT: 14 U/L (ref 0–55)
AST: 11 U/L (ref 5–34)
Anion Gap: 8 mEq/L (ref 3–11)
BUN: 8.6 mg/dL (ref 7.0–26.0)
CO2: 23 mEq/L (ref 22–29)
Calcium: 8.9 mg/dL (ref 8.4–10.4)
Chloride: 108 mEq/L (ref 98–109)
Creatinine: 1 mg/dL (ref 0.6–1.1)
GLUCOSE: 95 mg/dL (ref 70–140)
POTASSIUM: 4.5 meq/L (ref 3.5–5.1)
Sodium: 139 mEq/L (ref 136–145)
Total Bilirubin: 0.48 mg/dL (ref 0.20–1.20)
Total Protein: 6.2 g/dL — ABNORMAL LOW (ref 6.4–8.3)

## 2014-01-27 LAB — CBC WITH DIFFERENTIAL/PLATELET
BASO%: 0.5 % (ref 0.0–2.0)
BASOS ABS: 0 10*3/uL (ref 0.0–0.1)
EOS ABS: 0 10*3/uL (ref 0.0–0.5)
EOS%: 0.4 % (ref 0.0–7.0)
HEMATOCRIT: 35 % (ref 34.8–46.6)
HEMOGLOBIN: 11.2 g/dL — AB (ref 11.6–15.9)
LYMPH%: 30.2 % (ref 14.0–49.7)
MCH: 28.1 pg (ref 25.1–34.0)
MCHC: 32 g/dL (ref 31.5–36.0)
MCV: 87.6 fL (ref 79.5–101.0)
MONO#: 1 10*3/uL — ABNORMAL HIGH (ref 0.1–0.9)
MONO%: 28.3 % — ABNORMAL HIGH (ref 0.0–14.0)
NEUT%: 40.6 % (ref 38.4–76.8)
NEUTROS ABS: 1.5 10*3/uL (ref 1.5–6.5)
PLATELETS: 241 10*3/uL (ref 145–400)
RBC: 3.99 10*6/uL (ref 3.70–5.45)
RDW: 15.9 % — AB (ref 11.2–14.5)
WBC: 3.7 10*3/uL — ABNORMAL LOW (ref 3.9–10.3)
lymph#: 1.1 10*3/uL (ref 0.9–3.3)

## 2014-01-27 NOTE — Telephone Encounter (Signed)
Per staff message and POF I have scheduled appts. Advised scheduler of appts. JMW  

## 2014-01-27 NOTE — Assessment & Plan Note (Signed)
Neck pain: X-rays of the neck were reviewed which showed multilevel degenerative arthritis changes. Bone metastases: I discussed with her that IV Zometa is necessary to prevent fractures. Based on the new data, it can be given once every 3 months. I instructed her to take calcium and vitamin D. in addition as well.

## 2014-01-27 NOTE — Progress Notes (Signed)
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 Diffuse hypermetabolic right breast inflammatory carcinoma.  Metastatic lymphadenopathy in the right axilla, mediastinum, and bilateral hilar regions, small bilateral pulmonary metastases, possible 9th rib metastases   12/30/2013 -  Chemotherapy Taxotere and cytoxan given on day 1 of a 21 day cycle with neulasta given on day 2 for granulocyte support.  A total of 6 cycles are planned with CT chest and mammo/ultrasound planned after cycle 3.     CHIEF COMPLIANT: Today is cycle 2 day 8 of Taxotere Cytoxan with excellent improvement in breast cancer  INTERVAL HISTORY: Karen Douglas is a 58 year old American lady with above-mentioned history of metastatic breast cancer with bone metastases as well as lymph nodal metastases and questionable bilateral pulmonary metastases. She is currently on chemotherapy with Taxotere and Cytoxan but very large inflammatory breast cancer. On treatment, her  breast is significantly shrunk in size the size of the tumor is much smaller. She is tolerating treatment next very well except for lack of taste and alopecia. She just found out her daughter was admitted to hospital with seizures and is very concerned and worried about her. She denies any nausea vomiting.  REVIEW OF SYSTEMS:   Constitutional: Denies fevers, chills or abnormal weight loss Eyes: Denies blurriness of vision Ears, nose, mouth, throat, and face: Denies mucositis or sore throat Respiratory: Denies cough, dyspnea or wheezes Cardiovascular: Denies palpitation, chest discomfort or lower extremity swelling Gastrointestinal:  Denies nausea, heartburn or change in bowel habits Skin: Denies abnormal skin rashes Lymphatics: Denies new lymphadenopathy or easy bruising Neurological:Denies numbness, tingling or new weaknesses Behavioral/Psych: Mood is stable, no new changes  Breast: Much improvement in the inflammatory breast cancer. denies any pain or lumps or nodules in either breasts All other systems were reviewed with the patient and are negative.  I have reviewed the past medical history, past surgical history, social history and family history with the patient and they are unchanged from previous note.  ALLERGIES:  has No Known Allergies.  MEDICATIONS:  Current Outpatient Prescriptions  Medication Sig Dispense Refill  . atorvastatin (LIPITOR) 20 MG tablet Take 20 mg by mouth daily.      Marland Kitchen dexamethasone (DECADRON) 4 MG tablet Take 2 tablets (8 mg total) by mouth 2 (two) times daily. Start the day before Taxotere. Take once the day after, then 2 times a day x 2d.  30 tablet  1  . lidocaine-prilocaine (EMLA) cream Apply 1 application topically as needed.  [place]  g  0  . LORazepam (ATIVAN) 0.5 MG tablet Take 1 tablet (0.5 mg total) by mouth every 6 (six) hours as needed (Nausea or vomiting).  30 tablet  0  . ondansetron (ZOFRAN) 8 MG tablet Take 1 tablet (8 mg total) by mouth 2 (two) times  daily as needed (Nausea or vomiting). Begin 4 days after chemotherapy.  30 tablet  1  . oxyCODONE-acetaminophen (PERCOCET/ROXICET) 5-325 MG per tablet Take 1 tablet by mouth every 4 (four) hours as needed for severe pain.  30 tablet  0  . prochlorperazine (COMPAZINE) 10 MG tablet Take 1 tablet (10 mg total) by mouth every 6 (six) hours as needed (Nausea or vomiting).  30 tablet  1   No current facility-administered medications for this visit.    PHYSICAL EXAMINATION: ECOG PERFORMANCE STATUS: 1 - Symptomatic but completely ambulatory  Filed Vitals:   01/27/14 0816  BP: 102/63  Pulse: 100  Temp: 98.5 F (36.9 C)  Resp: 20   Filed Weights   01/27/14 0816  Weight: 199 lb 4.8 oz (90.402 kg)    GENERAL:alert, no distress and comfortable SKIN: skin color, texture, turgor are normal, no rashes or significant lesions EYES: normal, Conjunctiva are pink and non-injected, sclera clear OROPHARYNX:no exudate, no erythema and lips, buccal mucosa, and tongue normal  NECK: supple, thyroid normal size, non-tender, without nodularity LYMPH:  no palpable lymphadenopathy in the cervical, axillary or inguinal LUNGS: clear to auscultation and percussion with normal breathing effort HEART: regular rate & rhythm and no murmurs and no lower extremity edema ABDOMEN:abdomen soft, non-tender and normal bowel sounds Musculoskeletal:no cyanosis of digits and no clubbing  NEURO: alert & oriented x 3 with fluent speech, no focal motor/sensory deficits BREAST:Marked improvement in size of the right breast without any evidence of inflammatory changes. No palpable axillary supraclavicular or infraclavicular adenopathy no breast tenderness or nipple discharge.   LABORATORY DATA:  I have reviewed the data as listed   Chemistry      Component Value Date/Time   NA 139 01/27/2014 0801   K 4.5 01/27/2014 0801   CO2 23 01/27/2014 0801   BUN 8.6 01/27/2014 0801   CREATININE 1.0 01/27/2014 0801      Component  Value Date/Time   CALCIUM 8.9 01/27/2014 0801   ALKPHOS 82 01/27/2014 0801   AST 11 01/27/2014 0801   ALT 14 01/27/2014 0801   BILITOT 0.48 01/27/2014 0801       Lab Results  Component Value Date   WBC 3.7* 01/27/2014   HGB 11.2* 01/27/2014   HCT 35.0 01/27/2014   MCV 87.6 01/27/2014   PLT 241 01/27/2014   NEUTROABS 1.5 01/27/2014     RADIOGRAPHIC STUDIES: I have personally reviewed the radiology reports and agreed with their findings. No results found.   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 Monitoring closely for toxicities. Our plan is to re-stage her after 3 cycles with a mammogram and ultrasound as well as a CT chest to assess response to treatment. Our goal is to finish 6 cycles of chemotherapy to  get maximal tumor shrinkage and then initiate antiestrogen therapy for maintenance.  Today's exam revealed good response to the breast cancer and she is tolerating chemotherapy fairly well, so we will continue with the same dosage and monitor her closely.    Bone metastases Neck pain: X-rays of the neck were reviewed which showed multilevel degenerative arthritis changes. Bone metastases: I discussed with her that IV Zometa is necessary to prevent fractures. Based on the new data, it can be given once every 3 months. I instructed her to take calcium and vitamin D. in addition as well.   No orders of the defined types were placed in this encounter.   The patient has a good understanding of the overall plan. she agrees with it. She will  call with any problems that may develop before her next visit here.  I spent 20 minutes counseling the patient face to face. The total time spent in the appointment was 25 minutes and more than 50% was on counseling and review of test results    Rulon Eisenmenger, MD 01/27/2014 9:13 AM

## 2014-01-27 NOTE — Telephone Encounter (Signed)
MW added Zometa per 01/27/14 pof. Original appt made 01/06/14

## 2014-01-27 NOTE — Assessment & Plan Note (Signed)
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 Monitoring closely for toxicities. Our plan is to re-stage her after 3 cycles with a mammogram and ultrasound as well as a CT chest to assess response to treatment. Our goal is to finish 6 cycles of chemotherapy to get maximal tumor shrinkage and then initiate antiestrogen therapy for maintenance.  Today's exam revealed good response to the breast cancer and she is tolerating chemotherapy fairly well, so we will continue with the same dosage and monitor her closely.

## 2014-01-30 ENCOUNTER — Telehealth: Payer: Self-pay | Admitting: *Deleted

## 2014-01-30 NOTE — Telephone Encounter (Signed)
Received vm call from pt stating that Dr Lindi Adie suggested that she take calcium & wants to know if this needs to be called in or just pick up at pharmacy.  Returned call to pt & informed that the calcium is over the counter & suggested she start with one calcium 600 mg with Vit D 200 mg daily & if tol well increase to two daily & can be divided dose.  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  Suggested that she discuss with Mendel Ryder NP at next visit & also discussed precautions regarding chemo.  She expressed understanding & will discuss at next visit.

## 2014-02-07 ENCOUNTER — Telehealth: Payer: Self-pay

## 2014-02-07 ENCOUNTER — Other Ambulatory Visit: Payer: Self-pay | Admitting: *Deleted

## 2014-02-07 NOTE — Telephone Encounter (Signed)
-----   Message from Lurena Joiner sent at 02/03/2014  6:01 PM EDT ----- Regarding: RE: zometa NO PA REQUIRED FOR ZOMETA ----- Message -----    From: Prentiss Bells, RN    Sent: 01/27/2014   9:59 AM      To: Lurena Joiner Subject: zometa                                         He wants her to start on zometa 11/6.  Needs pre-auth

## 2014-02-07 NOTE — Telephone Encounter (Signed)
Per ntoe below - no pre-auth rqd for zometa.

## 2014-02-10 ENCOUNTER — Encounter: Payer: Self-pay | Admitting: Adult Health

## 2014-02-10 ENCOUNTER — Ambulatory Visit (HOSPITAL_BASED_OUTPATIENT_CLINIC_OR_DEPARTMENT_OTHER): Payer: 59

## 2014-02-10 ENCOUNTER — Other Ambulatory Visit (HOSPITAL_BASED_OUTPATIENT_CLINIC_OR_DEPARTMENT_OTHER): Payer: 59

## 2014-02-10 ENCOUNTER — Telehealth: Payer: Self-pay | Admitting: Adult Health

## 2014-02-10 ENCOUNTER — Other Ambulatory Visit: Payer: Self-pay | Admitting: Oncology

## 2014-02-10 ENCOUNTER — Ambulatory Visit (HOSPITAL_BASED_OUTPATIENT_CLINIC_OR_DEPARTMENT_OTHER): Payer: 59 | Admitting: Adult Health

## 2014-02-10 VITALS — BP 110/67 | HR 79 | Temp 98.4°F | Resp 18 | Ht 61.0 in | Wt 198.0 lb

## 2014-02-10 DIAGNOSIS — K5900 Constipation, unspecified: Secondary | ICD-10-CM

## 2014-02-10 DIAGNOSIS — C7951 Secondary malignant neoplasm of bone: Secondary | ICD-10-CM

## 2014-02-10 DIAGNOSIS — C50511 Malignant neoplasm of 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: Secondary | ICD-10-CM

## 2014-02-10 DIAGNOSIS — Z5111 Encounter for antineoplastic chemotherapy: Secondary | ICD-10-CM

## 2014-02-10 LAB — CBC WITH DIFFERENTIAL/PLATELET
BASO%: 0.3 % (ref 0.0–2.0)
Basophils Absolute: 0 10*3/uL (ref 0.0–0.1)
EOS ABS: 0 10*3/uL (ref 0.0–0.5)
EOS%: 0 % (ref 0.0–7.0)
HCT: 33.7 % — ABNORMAL LOW (ref 34.8–46.6)
HGB: 10.9 g/dL — ABNORMAL LOW (ref 11.6–15.9)
LYMPH%: 12.6 % — AB (ref 14.0–49.7)
MCH: 28.3 pg (ref 25.1–34.0)
MCHC: 32.4 g/dL (ref 31.5–36.0)
MCV: 87.2 fL (ref 79.5–101.0)
MONO#: 0.7 10*3/uL (ref 0.1–0.9)
MONO%: 7.9 % (ref 0.0–14.0)
NEUT%: 79.2 % — ABNORMAL HIGH (ref 38.4–76.8)
NEUTROS ABS: 6.7 10*3/uL — AB (ref 1.5–6.5)
PLATELETS: 300 10*3/uL (ref 145–400)
RBC: 3.87 10*6/uL (ref 3.70–5.45)
RDW: 15.9 % — AB (ref 11.2–14.5)
WBC: 8.4 10*3/uL (ref 3.9–10.3)
lymph#: 1.1 10*3/uL (ref 0.9–3.3)

## 2014-02-10 LAB — COMPREHENSIVE METABOLIC PANEL (CC13)
ALBUMIN: 3.5 g/dL (ref 3.5–5.0)
ALT: 12 U/L (ref 0–55)
ANION GAP: 11 meq/L (ref 3–11)
AST: 11 U/L (ref 5–34)
Alkaline Phosphatase: 85 U/L (ref 40–150)
BUN: 12.6 mg/dL (ref 7.0–26.0)
CO2: 21 meq/L — AB (ref 22–29)
Calcium: 9.7 mg/dL (ref 8.4–10.4)
Chloride: 109 mEq/L (ref 98–109)
Creatinine: 0.8 mg/dL (ref 0.6–1.1)
Glucose: 105 mg/dl (ref 70–140)
POTASSIUM: 4.2 meq/L (ref 3.5–5.1)
SODIUM: 141 meq/L (ref 136–145)
TOTAL PROTEIN: 7.2 g/dL (ref 6.4–8.3)
Total Bilirubin: 0.36 mg/dL (ref 0.20–1.20)

## 2014-02-10 MED ORDER — DEXAMETHASONE SODIUM PHOSPHATE 20 MG/5ML IJ SOLN
20.0000 mg | Freq: Once | INTRAMUSCULAR | Status: AC
  Administered 2014-02-10: 20 mg via INTRAVENOUS

## 2014-02-10 MED ORDER — SODIUM CHLORIDE 0.9 % IV SOLN
Freq: Once | INTRAVENOUS | Status: AC
  Administered 2014-02-10: 10:00:00 via INTRAVENOUS

## 2014-02-10 MED ORDER — HEPARIN SOD (PORK) LOCK FLUSH 100 UNIT/ML IV SOLN
500.0000 [IU] | Freq: Once | INTRAVENOUS | Status: AC | PRN
  Administered 2014-02-10: 500 [IU]
  Filled 2014-02-10: qty 5

## 2014-02-10 MED ORDER — ZOLEDRONIC ACID 4 MG/100ML IV SOLN
4.0000 mg | Freq: Once | INTRAVENOUS | Status: AC
  Administered 2014-02-10: 4 mg via INTRAVENOUS
  Filled 2014-02-10: qty 100

## 2014-02-10 MED ORDER — SODIUM CHLORIDE 0.9 % IJ SOLN
10.0000 mL | INTRAMUSCULAR | Status: DC | PRN
  Administered 2014-02-10: 10 mL
  Filled 2014-02-10: qty 10

## 2014-02-10 MED ORDER — ONDANSETRON 16 MG/50ML IVPB (CHCC)
INTRAVENOUS | Status: AC
  Filled 2014-02-10: qty 16

## 2014-02-10 MED ORDER — ONDANSETRON 16 MG/50ML IVPB (CHCC)
16.0000 mg | Freq: Once | INTRAVENOUS | Status: AC
  Administered 2014-02-10: 16 mg via INTRAVENOUS

## 2014-02-10 MED ORDER — DEXAMETHASONE SODIUM PHOSPHATE 20 MG/5ML IJ SOLN
INTRAMUSCULAR | Status: AC
  Filled 2014-02-10: qty 5

## 2014-02-10 MED ORDER — CYCLOPHOSPHAMIDE CHEMO INJECTION 1 GM
600.0000 mg/m2 | Freq: Once | INTRAMUSCULAR | Status: AC
  Administered 2014-02-10: 1180 mg via INTRAVENOUS
  Filled 2014-02-10: qty 59

## 2014-02-10 MED ORDER — DOCETAXEL CHEMO INJECTION 160 MG/16ML
75.0000 mg/m2 | Freq: Once | INTRAVENOUS | Status: AC
  Administered 2014-02-10: 150 mg via INTRAVENOUS
  Filled 2014-02-10: qty 15

## 2014-02-10 NOTE — Patient Instructions (Signed)

## 2014-02-10 NOTE — Patient Instructions (Signed)
[facility] Discharge Instructions for Patients Receiving Chemotherapy  Today you received the following chemotherapy agents: Taxotere, Cytoxan, Zometa  To help prevent nausea and vomiting after your treatment, we encourage you to take your nausea medication as prescribed by your physician.   If you develop nausea and vomiting that is not controlled by your nausea medication, call the clinic.   BELOW ARE SYMPTOMS THAT SHOULD BE REPORTED IMMEDIATELY:  *FEVER GREATER THAN 100.5 F  *CHILLS WITH OR WITHOUT FEVER  NAUSEA AND VOMITING THAT IS NOT CONTROLLED WITH YOUR NAUSEA MEDICATION  *UNUSUAL SHORTNESS OF BREATH  *UNUSUAL BRUISING OR BLEEDING  TENDERNESS IN MOUTH AND THROAT WITH OR WITHOUT PRESENCE OF ULCERS  *URINARY PROBLEMS  *BOWEL PROBLEMS  UNUSUAL RASH Items with * indicate a potential emergency and should be followed up as soon as possible.  Feel free to call the clinic you have any questions or concerns. The clinic phone number is [phone].

## 2014-02-10 NOTE — Telephone Encounter (Signed)
, °

## 2014-02-10 NOTE — Progress Notes (Signed)
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 Diffuse hypermetabolic right breast inflammatory carcinoma.  Metastatic lymphadenopathy in the right axilla, mediastinum, and bilateral hilar regions, small bilateral pulmonary metastases, possible 9th rib metastases   12/30/2013 -  Chemotherapy Taxotere and cytoxan given on day 1 of a 21 day cycle with neulasta given on day 2 for granulocyte support.  A total of 6 cycles are planned with CT chest and mammo/ultrasound planned after cycle 3.     CHIEF COMPLIANT: cycle 3 day 1 Taxotere, Cytoxan  INTERVAL HISTORY: Karen Douglas is a 58 year old American lady with above-mentioned history of metastatic breast cancer with bone metastases as well as lymph nodal metastases and questionable bilateral pulmonary metastases. She is currently on chemotherapy with Taxotere and Cytoxan but very large inflammatory breast cancer.  She says the worse part about her chemotherapy treatment is the  constipation.  She was just taking stool softeners and says she is going to start taking miralax for this.  She does have mild intermittent tingling in her fingertips.  Her nails are starting to change color along with the palms of her hands.  Otherwise, she denies fevers, chills, nausea, vomiting, diarrhea, or any further concerns.   REVIEW OF SYSTEMS:   A 10 point review of systems was conducted and is otherwise negative except for what is noted above.    Past Medical History  Diagnosis Date  . Headache   . Cancer     breast   Past Surgical History  Procedure Laterality Date  . Abdominal hysterectomy    . Finger surgery    . Portacath placement N/A 12/19/2013    Procedure: INSERTION PORT-A-CATH;  Surgeon: Autumn Messing III, MD;  Location: [facility];  Service: General;  Laterality: N/A;   History   Social History  . Marital Status: Married    Spouse Name: N/A    Number of Children: N/A  . Years of Education: N/A   Social History Main Topics  . Smoking status: Never Smoker   . Smokeless tobacco: Current User    Types: Snuff  . Alcohol Use: Yes     Comment: occasionally  . Drug Use: No  . Sexual Activity: None   Other Topics Concern  . None   Social History Narrative   History reviewed. No pertinent family history.   ALLERGIES:  has No Known Allergies.  MEDICATIONS:  Current Outpatient Prescriptions  Medication Sig Dispense Refill  . atorvastatin (LIPITOR) 20 MG tablet Take 20 mg  by mouth daily.    . calcium carbonate (OS-CAL) 600 MG TABS tablet Take 600 mg by mouth daily with breakfast.    . dexamethasone (DECADRON) 4 MG tablet Take 2 tablets (8 mg total) by mouth 2 (two) times daily. Start the day before Taxotere. Take once the day after, then 2 times a day x 2d. 30 tablet 1  . lidocaine-prilocaine (EMLA) cream Apply 1 application topically as needed. 30 g 0  . LORazepam (ATIVAN) 0.5 MG tablet Take 1 tablet (0.5 mg total) by mouth every 6 (six) hours as needed (Nausea or  vomiting). 30 tablet 0  . ondansetron (ZOFRAN) 8 MG tablet Take 1 tablet (8 mg total) by mouth 2 (two) times daily as needed (Nausea or vomiting). Begin 4 days after chemotherapy. 30 tablet 1  . oxyCODONE-acetaminophen (PERCOCET/ROXICET) 5-325 MG per tablet Take 1 tablet by mouth every 4 (four) hours as needed for severe pain. 30 tablet 0  . prochlorperazine (COMPAZINE) 10 MG tablet Take 1 tablet (10 mg total) by mouth every 6 (six) hours as needed (Nausea or vomiting). 30 tablet 1   No current facility-administered medications for this visit.    PHYSICAL EXAMINATION: ECOG PERFORMANCE STATUS: 1 - Symptomatic but completely ambulatory  Filed Vitals:   02/10/14 0833  BP: 110/67  Pulse: 79  Temp: 98.4 F (36.9 C)  Resp: 18   Filed Weights   02/10/14 0833  Weight: 198 lb (89.812 kg)  GENERAL: Patient is a well appearing female in no acute distress HEENT:  Sclerae anicteric.  Oropharynx clear and moist. No ulcerations or evidence of oropharyngeal candidiasis. Neck is supple.  NODES:  No cervical, supraclavicular, or axillary lymphadenopathy palpated.  BREAST EXAM:  Deferred. LUNGS:  Clear to auscultation bilaterally.  No wheezes or rhonchi. HEART:  Regular rate and rhythm. No murmur appreciated. ABDOMEN:  Soft, nontender.  Positive, normoactive bowel sounds. No organomegaly palpated. MSK:  No focal spinal tenderness to palpation. Full range of motion bilaterally in the upper extremities. EXTREMITIES:  No peripheral edema.   SKIN:  Hyperpigmentation on hands, hyperpigmentation noted at the base of the nail bed, no brittleness noted. NEURO:  Nonfocal. Well oriented.  Appropriate affect.     LABORATORY DATA:  I have reviewed the data as listed   Chemistry      Component Value Date/Time   NA 139 01/27/2014 0801   K 4.5 01/27/2014 0801   CO2 23 01/27/2014 0801   BUN 8.6 01/27/2014 0801   CREATININE 1.0 01/27/2014 0801      Component Value Date/Time   CALCIUM 8.9 01/27/2014 0801    ALKPHOS 82 01/27/2014 0801   AST 11 01/27/2014 0801   ALT 14 01/27/2014 0801   BILITOT 0.48 01/27/2014 0801       Lab Results  Component Value Date   WBC 8.4 02/10/2014   HGB 10.9* 02/10/2014   HCT 33.7* 02/10/2014   MCV 87.2 02/10/2014   PLT 300 02/10/2014   NEUTROABS 6.7* 02/10/2014     RADIOGRAPHIC STUDIES: I have personally reviewed the radiology reports and agreed with their findings. No results found.   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   1. Chemotherapy with Taxotere and Cytoxan beginning 12/30/13. Patient receives this on day 1 of a 21 day cycle with Neualsta given on day 2. She will receive a total of 6 cycles with CT chest and mammo/ultrasound after cycle 3.   2. Bone Metastases, Zometa every 3 months, Calcium and  Vitamin D supplementation.  PLAN:  Maeghan is doing well today.  Her CBC is stable and I reviewed it with her in detail.  She does have a a mild treatment related anemia that we will continue to monitor.  She will proceed with Cycle 3 of Taxotere and Cytoxan today.  I ordered a CT chest and a mammo/ultrasound as per Dr. Geralyn Flash prior note to eval response to treatment.  This was requested to be done in the next week or two.    She was recommended tea tree oil for the nail changes, and hyperpigmentation of her hands.  For the constipation she was recommended to take daily Miralax.  She will also receive Zometa today and every 12 weeks for her bone metastases.  I gave her detailed information about this in her AVS.  Kimesha will return tomorrow for neulasta and in one week for labs and evaluation.    The patient has a good understanding of the overall plan. she agrees with it. She will call with any problems that may develop before her next visit here.  I spent 25 minutes counseling the patient face to face. The total time spent in the appointment was 30 minutes and more than 50% was on counseling and review of  test results   Minette Headland, [place] [phone]  02/10/2014 9:16 AM

## 2014-02-11 ENCOUNTER — Ambulatory Visit (HOSPITAL_BASED_OUTPATIENT_CLINIC_OR_DEPARTMENT_OTHER): Payer: 59

## 2014-02-11 DIAGNOSIS — C7951 Secondary malignant neoplasm of bone: Secondary | ICD-10-CM

## 2014-02-11 DIAGNOSIS — C50511 Malignant neoplasm of 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: Secondary | ICD-10-CM

## 2014-02-11 DIAGNOSIS — Z5189 Encounter for other specified aftercare: Secondary | ICD-10-CM

## 2014-02-11 MED ORDER — PEGFILGRASTIM INJECTION 6 MG/0.6ML ~~LOC~~
6.0000 mg | PREFILLED_SYRINGE | Freq: Once | SUBCUTANEOUS | Status: AC
  Administered 2014-02-11: 6 mg via SUBCUTANEOUS

## 2014-02-13 ENCOUNTER — Telehealth: Payer: Self-pay

## 2014-02-13 NOTE — Telephone Encounter (Signed)
Returned pt call re: constipation.  Pt reports she has not had a BM since Friday 11/6.  Pt reports no pain, is passing gas, but is unable to clearly communicate what her normal BM pattern was prior to chemotherapy.  States "she has always been irregular and taken stool softeners.  Sometime she once a week, sometimes more than that.  Advised patient that if she is not in pain, passing gas, and does not feel the urge to go but is unable to do so, she should continue with plenty of water, the stool softeners and possibly some prune juice mixed with apple juice.  Advised pt to call the clinic if she is unable to have BM while feeling the urge to go, starts having abdominal pain, or stops passing gas, she should call the clinic immediately.  Pt voiced understanding.

## 2014-02-16 ENCOUNTER — Ambulatory Visit
Admission: RE | Admit: 2014-02-16 | Discharge: 2014-02-16 | Disposition: A | Discharge status: Home / Self Care | Payer: 59 | Source: Ambulatory Visit | Attending: Adult Health | Admitting: Adult Health

## 2014-02-16 DIAGNOSIS — C50511 Malignant neoplasm of 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: Secondary | ICD-10-CM

## 2014-02-16 MED ORDER — IOHEXOL 300 MG/ML  SOLN
75.0000 mL | Freq: Once | INTRAMUSCULAR | Status: AC | PRN
  Administered 2014-02-16: 75 mL via INTRAVENOUS

## 2014-02-17 ENCOUNTER — Ambulatory Visit (HOSPITAL_BASED_OUTPATIENT_CLINIC_OR_DEPARTMENT_OTHER): Payer: 59 | Admitting: Adult Health

## 2014-02-17 ENCOUNTER — Encounter: Payer: Self-pay | Admitting: Adult Health

## 2014-02-17 ENCOUNTER — Other Ambulatory Visit (HOSPITAL_BASED_OUTPATIENT_CLINIC_OR_DEPARTMENT_OTHER): Payer: 59

## 2014-02-17 VITALS — BP 102/55 | HR 109 | Temp 98.2°F | Resp 18 | Ht 61.0 in | Wt 203.7 lb

## 2014-02-17 DIAGNOSIS — C50511 Malignant neoplasm of 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: Secondary | ICD-10-CM

## 2014-02-17 DIAGNOSIS — C779 Secondary and unspecified malignant neoplasm of lymph node, unspecified: Secondary | ICD-10-CM

## 2014-02-17 DIAGNOSIS — C7951 Secondary malignant neoplasm of bone: Secondary | ICD-10-CM

## 2014-02-17 LAB — COMPREHENSIVE METABOLIC PANEL (CC13)
ALT: 8 U/L (ref 0–55)
ANION GAP: 5 meq/L (ref 3–11)
AST: 11 U/L (ref 5–34)
Albumin: 3 g/dL — ABNORMAL LOW (ref 3.5–5.0)
Alkaline Phosphatase: 83 U/L (ref 40–150)
BUN: 8.6 mg/dL (ref 7.0–26.0)
CHLORIDE: 108 meq/L (ref 98–109)
CO2: 25 meq/L (ref 22–29)
Calcium: 8.5 mg/dL (ref 8.4–10.4)
Creatinine: 0.9 mg/dL (ref 0.6–1.1)
Glucose: 111 mg/dl (ref 70–140)
Potassium: 4.2 mEq/L (ref 3.5–5.1)
Sodium: 138 mEq/L (ref 136–145)
TOTAL PROTEIN: 6 g/dL — AB (ref 6.4–8.3)
Total Bilirubin: 0.46 mg/dL (ref 0.20–1.20)

## 2014-02-17 LAB — CBC WITH DIFFERENTIAL/PLATELET
BASO%: 0.4 % (ref 0.0–2.0)
Basophils Absolute: 0 10*3/uL (ref 0.0–0.1)
EOS%: 0.5 % (ref 0.0–7.0)
Eosinophils Absolute: 0 10*3/uL (ref 0.0–0.5)
HCT: 31.3 % — ABNORMAL LOW (ref 34.8–46.6)
HGB: 10.2 g/dL — ABNORMAL LOW (ref 11.6–15.9)
LYMPH#: 1 10*3/uL (ref 0.9–3.3)
LYMPH%: 24.1 % (ref 14.0–49.7)
MCH: 28.7 pg (ref 25.1–34.0)
MCHC: 32.6 g/dL (ref 31.5–36.0)
MCV: 88.1 fL (ref 79.5–101.0)
MONO#: 1.3 10*3/uL — AB (ref 0.1–0.9)
MONO%: 28.9 % — ABNORMAL HIGH (ref 0.0–14.0)
NEUT#: 2 10*3/uL (ref 1.5–6.5)
NEUT%: 46.1 % (ref 38.4–76.8)
Platelets: 233 10*3/uL (ref 145–400)
RBC: 3.56 10*6/uL — AB (ref 3.70–5.45)
RDW: 16.7 % — ABNORMAL HIGH (ref 11.2–14.5)
WBC: 4.4 10*3/uL (ref 3.9–10.3)

## 2014-02-17 NOTE — Progress Notes (Signed)
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 Diffuse hypermetabolic right breast inflammatory carcinoma.  Metastatic lymphadenopathy in the right axilla, mediastinum, and bilateral hilar regions, small bilateral pulmonary metastases, possible 9th rib metastases   12/30/2013 -  Chemotherapy Taxotere and cytoxan given on day 1 of a 21 day cycle with neulasta given on day 2 for granulocyte support.  A total of 6 cycles are planned with CT chest and mammo/ultrasound planned after cycle 3.     CHIEF COMPLIANT: cycle 3 day 8 Taxotere, Cytoxan  INTERVAL HISTORY: Karen Douglas is a 58 year old American lady with above-mentioned history of metastatic breast cancer with bone metastases as well as lymph nodal metastases and questionable bilateral pulmonary metastases.  She is doing well today.  She underwent mammogram and CT scan of the chest.  She has about 75% of her appetite.  She does have some constipation that she is managing with  Miralax.  Otherwise, she denies fevers, chills, nausea, vomiting, diarrhea, numbness/tingling, skin/nail changes, or any further concerns.   REVIEW OF SYSTEMS:   A 10 point review of systems was conducted and is otherwise negative except for what is noted above.    Past Medical History  Diagnosis Date  . Headache   . Cancer     breast   Past Surgical History  Procedure Laterality Date  . Abdominal hysterectomy    . Finger surgery    . Portacath placement N/A 12/19/2013    Procedure: INSERTION PORT-A-CATH;  Surgeon: Autumn Messing III, MD;  Location: [facility];  Service: General;  Laterality: N/A;   History   Social History  . Marital Status: Married    Spouse Name: N/A    Number of Children: N/A  . Years of Education: N/A   Social History Main Topics  . Smoking status: Never Smoker   . Smokeless tobacco: Current User    Types: Snuff  . Alcohol Use: Yes     Comment: occasionally  . Drug Use: No  . Sexual Activity: None   Other Topics Concern  . None   Social History Narrative   History reviewed. No pertinent family history.   ALLERGIES:  has No Known Allergies.  MEDICATIONS:  Current Outpatient Prescriptions  Medication Sig Dispense Refill  . atorvastatin (LIPITOR) 20 MG tablet Take 20 mg by mouth daily.    . calcium carbonate (OS-CAL) 600 MG TABS tablet Take 600 mg by mouth daily with breakfast.    . LORazepam (ATIVAN) 0.5 MG tablet Take  1 tablet (0.5 mg total) by mouth every 6 (six) hours as needed (Nausea or vomiting). 30 tablet 0  . dexamethasone (DECADRON) 4 MG tablet Take 2 tablets (8 mg total) by mouth 2 (two) times daily. Start the day before Taxotere. Take once the day after, then 2 times a day x 2d. 30 tablet 1  . lidocaine-prilocaine (EMLA) cream Apply 1 application topically as needed. 30 g 0  . ondansetron (ZOFRAN) 8 MG tablet Take 1 tablet (8 mg total) by mouth 2 (two) times daily as needed (Nausea or vomiting). Begin 4 days after chemotherapy. 30 tablet 1  .  oxyCODONE-acetaminophen (PERCOCET/ROXICET) 5-325 MG per tablet Take 1 tablet by mouth every 4 (four) hours as needed for severe pain. 30 tablet 0  . prochlorperazine (COMPAZINE) 10 MG tablet Take 1 tablet (10 mg total) by mouth every 6 (six) hours as needed (Nausea or vomiting). 30 tablet 1   No current facility-administered medications for this visit.    PHYSICAL EXAMINATION: ECOG PERFORMANCE STATUS: 1 - Symptomatic but completely ambulatory  Filed Vitals:   02/17/14 0929  BP: 102/55  Pulse: 109  Temp: 98.2 F (36.8 C)  Resp: 18   Filed Weights   02/17/14 0929  Weight: 203 lb 11.2 oz (92.398 kg)  GENERAL: Patient is a well appearing female in no acute distress HEENT:  Sclerae anicteric.  Oropharynx clear and moist. No ulcerations or evidence of oropharyngeal candidiasis. Neck is supple.  NODES:  No cervical, supraclavicular, or axillary lymphadenopathy palpated.  BREAST EXAM:  Deferred. LUNGS:  Clear to auscultation bilaterally.  No wheezes or rhonchi. HEART:  Regular rate and rhythm. No murmur appreciated. ABDOMEN:  Soft, nontender.  Positive, normoactive bowel sounds. No organomegaly palpated. MSK:  No focal spinal tenderness to palpation. Full range of motion bilaterally in the upper extremities. EXTREMITIES:  No peripheral edema.   SKIN:  Hyperpigmentation on hands, hyperpigmentation noted at the base of the nail bed, no brittleness noted. NEURO:  Nonfocal. Well oriented.  Appropriate affect.     LABORATORY DATA:  I have reviewed the data as listed   Chemistry      Component Value Date/Time   NA 138 02/17/2014 0900   K 4.2 02/17/2014 0900   CO2 25 02/17/2014 0900   BUN 8.6 02/17/2014 0900   CREATININE 0.9 02/17/2014 0900      Component Value Date/Time   CALCIUM 8.5 02/17/2014 0900   ALKPHOS 83 02/17/2014 0900   AST 11 02/17/2014 0900   ALT 8 02/17/2014 0900   BILITOT 0.46 02/17/2014 0900       Lab Results  Component Value Date   WBC 4.4 02/17/2014   HGB  10.2* 02/17/2014   HCT 31.3* 02/17/2014   MCV 88.1 02/17/2014   PLT 233 02/17/2014   NEUTROABS 2.0 02/17/2014     RADIOGRAPHIC STUDIES: I have personally reviewed the radiology reports and agreed with their findings. Ct Chest W Contrast  02/16/2014   CLINICAL DATA:  Chemotherapy for right breast cancer diagnosed in August 2015. Recent PET-CT showed right axillary nodes, anterior right middle lobe lung nodules and peripheral left lower lobe lung nodules. Nonsmoker.  EXAM: CT CHEST WITH CONTRAST  TECHNIQUE: Multidetector CT imaging of the chest was performed during intravenous contrast administration.  CONTRAST:  27mL OMNIPAQUE IOHEXOL 300 MG/ML  SOLN  COMPARISON:  PET-CT 12/20/2013  FINDINGS: Heart:  Heart size is normal.  Vascular structures: Unremarkable.  Mediastinum/thyroid: Right hilar lymph node measures 1.2 cm. Small sub cm mediastinal lymph  nodes are present. No new adenopathy identified. Right axillary nodes appear significantly smaller. No suspicious left axillary lymph nodes. The visualized portion of the thyroid gland has a normal appearance.  Lungs/Airways: Nodules previously noted in the right middle lobe and left lower lobe have almost completely resolved. No new nodules are identified.  Upper abdomen: Left upper quadrant splenules. Adrenal glands are normal.  Chest wall/osseous structures: Right breast mass is now smaller. Skin thickening over the right breast is also improved of persistently abnormal. No suspicious lytic or blastic lesions are identified. Mild mid thoracic degenerative changes.  IMPRESSION: 1. Small right hilar lymph node now measures 1.2 cm. Persistent small mediastinal lymph nodes. The mediastinal and hilar lymph nodes which showed FDG avidity on the prior study were small. Therefore it may be difficult to follow these nodes using size criteria alone. 2. Significant improvement in the appearance of right breast mass and right breast skin thickening. 3. Smaller right  axillary adenopathy. 4. Pulmonary nodules are now almost completely resolved.   Electronically Signed   By: Shon Hale M.D.   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   1. Chemotherapy with Taxotere and Cytoxan beginning 12/30/13. Patient receives this on day 1 of a 21 day cycle with Neualsta given on day 2. She will receive a total of 6 cycles and CT chest and mammo/ultrasound after cycle 3 demonstrated improvement with her breast cancer.    2. Bone Metastases, Zometa every 3 months, Calcium and Vitamin D supplementation.  Last received 02/10/14, due again on 05/05/13.    PLAN:  Karen Douglas is doing well today.  Her CBC is stable and I reviewed it with her in detail.  She continues to have a mild treatment related anemia that is stable.    I recommended she continue Miralax for her constipation.    I reviewed her CT chest results and mammo/ultrasound results with her in detail.  She is responding to chemotherapy well.  She will receive 3 more cycles with f/u MRI afterwards prior to surgery.  Dr. Lindi Adie reviewed the results and is in agreement.  Kerina will return in 2 weeks for labs, evaluation, and chemotherapy.  The patient has a good understanding of the overall plan. she agrees with it. She will call with any problems that may develop before her next visit here.  I spent 25 minutes counseling the patient face to face. The total time spent in the appointment was 30 minutes and more than 50% was on counseling and review of test results   Minette Headland, [place] [phone]  02/17/2014 1:26 PM

## 2014-02-20 ENCOUNTER — Telehealth: Payer: Self-pay | Admitting: *Deleted

## 2014-02-20 ENCOUNTER — Telehealth: Payer: Self-pay | Admitting: Hematology and Oncology

## 2014-02-20 NOTE — Telephone Encounter (Signed)
I have adjsuted 11/27 appt

## 2014-02-20 NOTE — Telephone Encounter (Signed)
, °

## 2014-02-22 ENCOUNTER — Telehealth: Payer: Self-pay | Admitting: Adult Health

## 2014-02-22 NOTE — Telephone Encounter (Signed)
, °

## 2014-02-26 ENCOUNTER — Other Ambulatory Visit: Payer: Self-pay | Admitting: Hematology and Oncology

## 2014-03-01 ENCOUNTER — Telehealth: Payer: Self-pay

## 2014-03-01 ENCOUNTER — Other Ambulatory Visit: Payer: Self-pay | Admitting: *Deleted

## 2014-03-01 DIAGNOSIS — C50511 Malignant neoplasm of 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: Secondary | ICD-10-CM

## 2014-03-01 MED ORDER — DEXAMETHASONE 4 MG PO TABS: 8.0000 mg | ORAL_TABLET | Freq: Two times a day (BID) | ORAL | Status: DC

## 2014-03-01 NOTE — Telephone Encounter (Signed)
Ultrasound and mammogram results rcvd from [facility] 02/13/14 Dr Luan Pulling.  Copy to Dr Lindi Adie.  Original to scan.

## 2014-03-03 ENCOUNTER — Encounter: Payer: Self-pay | Admitting: Hematology and Oncology

## 2014-03-03 ENCOUNTER — Ambulatory Visit (HOSPITAL_BASED_OUTPATIENT_CLINIC_OR_DEPARTMENT_OTHER): Payer: 59 | Admitting: Adult Health

## 2014-03-03 ENCOUNTER — Other Ambulatory Visit: Payer: Self-pay | Admitting: Hematology

## 2014-03-03 ENCOUNTER — Other Ambulatory Visit: Payer: 59

## 2014-03-03 ENCOUNTER — Encounter: Payer: Self-pay | Admitting: Adult Health

## 2014-03-03 ENCOUNTER — Other Ambulatory Visit (HOSPITAL_BASED_OUTPATIENT_CLINIC_OR_DEPARTMENT_OTHER): Payer: 59

## 2014-03-03 ENCOUNTER — Ambulatory Visit: Payer: 59 | Admitting: Hematology and Oncology

## 2014-03-03 ENCOUNTER — Ambulatory Visit (HOSPITAL_BASED_OUTPATIENT_CLINIC_OR_DEPARTMENT_OTHER): Payer: 59

## 2014-03-03 VITALS — BP 112/68 | HR 85 | Temp 98.4°F | Resp 18 | Ht 61.0 in | Wt 198.4 lb

## 2014-03-03 DIAGNOSIS — C7951 Secondary malignant neoplasm of bone: Secondary | ICD-10-CM

## 2014-03-03 DIAGNOSIS — D6481 Anemia due to antineoplastic chemotherapy: Secondary | ICD-10-CM

## 2014-03-03 DIAGNOSIS — C50511 Malignant neoplasm of 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: Secondary | ICD-10-CM

## 2014-03-03 DIAGNOSIS — Z5111 Encounter for antineoplastic chemotherapy: Secondary | ICD-10-CM

## 2014-03-03 DIAGNOSIS — C778 Secondary and unspecified malignant neoplasm of lymph nodes of multiple regions: Secondary | ICD-10-CM

## 2014-03-03 DIAGNOSIS — K5900 Constipation, unspecified: Secondary | ICD-10-CM

## 2014-03-03 LAB — CBC WITH DIFFERENTIAL/PLATELET
BASO%: 0.1 % (ref 0.0–2.0)
BASOS ABS: 0 10*3/uL (ref 0.0–0.1)
EOS%: 0 % (ref 0.0–7.0)
Eosinophils Absolute: 0 10*3/uL (ref 0.0–0.5)
HEMATOCRIT: 32.3 % — AB (ref 34.8–46.6)
HGB: 10.4 g/dL — ABNORMAL LOW (ref 11.6–15.9)
LYMPH#: 0.9 10*3/uL (ref 0.9–3.3)
LYMPH%: 10.3 % — AB (ref 14.0–49.7)
MCH: 28.6 pg (ref 25.1–34.0)
MCHC: 32.3 g/dL (ref 31.5–36.0)
MCV: 88.3 fL (ref 79.5–101.0)
MONO#: 0.7 10*3/uL (ref 0.1–0.9)
MONO%: 7.7 % (ref 0.0–14.0)
NEUT#: 7.2 10*3/uL — ABNORMAL HIGH (ref 1.5–6.5)
NEUT%: 81.9 % — AB (ref 38.4–76.8)
Platelets: 328 10*3/uL (ref 145–400)
RBC: 3.65 10*6/uL — ABNORMAL LOW (ref 3.70–5.45)
RDW: 18.8 % — ABNORMAL HIGH (ref 11.2–14.5)
WBC: 8.7 10*3/uL (ref 3.9–10.3)

## 2014-03-03 LAB — COMPREHENSIVE METABOLIC PANEL (CC13)
ALK PHOS: 83 U/L (ref 40–150)
ALT: 10 U/L (ref 0–55)
AST: 10 U/L (ref 5–34)
Albumin: 3.5 g/dL (ref 3.5–5.0)
Anion Gap: 11 mEq/L (ref 3–11)
BILIRUBIN TOTAL: 0.36 mg/dL (ref 0.20–1.20)
BUN: 9.9 mg/dL (ref 7.0–26.0)
CHLORIDE: 113 meq/L — AB (ref 98–109)
CO2: 19 mEq/L — ABNORMAL LOW (ref 22–29)
CREATININE: 0.8 mg/dL (ref 0.6–1.1)
Calcium: 9.6 mg/dL (ref 8.4–10.4)
Glucose: 116 mg/dl (ref 70–140)
Potassium: 4.2 mEq/L (ref 3.5–5.1)
Sodium: 142 mEq/L (ref 136–145)
Total Protein: 7 g/dL (ref 6.4–8.3)

## 2014-03-03 MED ORDER — DOCETAXEL CHEMO INJECTION 160 MG/16ML
75.0000 mg/m2 | Freq: Once | INTRAVENOUS | Status: AC
  Administered 2014-03-03: 150 mg via INTRAVENOUS
  Filled 2014-03-03: qty 15

## 2014-03-03 MED ORDER — DEXAMETHASONE SODIUM PHOSPHATE 20 MG/5ML IJ SOLN
INTRAMUSCULAR | Status: AC
  Filled 2014-03-03: qty 5

## 2014-03-03 MED ORDER — SODIUM CHLORIDE 0.9 % IJ SOLN
10.0000 mL | INTRAMUSCULAR | Status: DC | PRN
  Administered 2014-03-03: 10 mL
  Filled 2014-03-03: qty 10

## 2014-03-03 MED ORDER — SODIUM CHLORIDE 0.9 % IV SOLN
600.0000 mg/m2 | Freq: Once | INTRAVENOUS | Status: AC
  Administered 2014-03-03: 1180 mg via INTRAVENOUS
  Filled 2014-03-03: qty 59

## 2014-03-03 MED ORDER — HEPARIN SOD (PORK) LOCK FLUSH 100 UNIT/ML IV SOLN
500.0000 [IU] | Freq: Once | INTRAVENOUS | Status: AC | PRN
  Administered 2014-03-03: 500 [IU]
  Filled 2014-03-03: qty 5

## 2014-03-03 MED ORDER — DEXAMETHASONE SODIUM PHOSPHATE 20 MG/5ML IJ SOLN
20.0000 mg | Freq: Once | INTRAMUSCULAR | Status: AC
  Administered 2014-03-03: 20 mg via INTRAVENOUS

## 2014-03-03 MED ORDER — ONDANSETRON 16 MG/50ML IVPB (CHCC)
INTRAVENOUS | Status: AC
Start: 2014-03-03 — End: 2014-03-03
  Filled 2014-03-03: qty 16

## 2014-03-03 MED ORDER — ONDANSETRON 16 MG/50ML IVPB (CHCC)
16.0000 mg | Freq: Once | INTRAVENOUS | Status: AC
  Administered 2014-03-03: 16 mg via INTRAVENOUS

## 2014-03-03 MED ORDER — SODIUM CHLORIDE 0.9 % IV SOLN
Freq: Once | INTRAVENOUS | Status: AC
  Administered 2014-03-03: 10:00:00 via INTRAVENOUS

## 2014-03-03 NOTE — Progress Notes (Signed)
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 Diffuse hypermetabolic right breast inflammatory carcinoma.  Metastatic lymphadenopathy in the right axilla, mediastinum, and bilateral hilar regions, small bilateral pulmonary metastases, possible 9th rib metastases   12/30/2013 -  Chemotherapy Taxotere and cytoxan given on day 1 of a 21 day cycle with neulasta given on day 2 for granulocyte support.  A total of 6 cycles are planned with CT chest and mammo/ultrasound planned after cycle 3.     CHIEF COMPLIANT: cycle 4 day 1 Taxotere, Cytoxan  INTERVAL HISTORY: Karen Douglas is a 58 year old American lady with above-mentioned history of metastatic breast cancer with bone metastases as well as lymph nodal metastases and questionable bilateral pulmonary metastases.  She is doing well today.  She had a good Thanksgiving.  Her main issue is fatigue.  It hasn't improved since receiving her third cycle.  She has mild intermittent numbness in  her fingertips.  She denies any motor deficits due to this and she denies any numbness/tingling  In her toes or feet.  She has some mild constipation, her last bowel movement was Wednesday, 03/01/14 and she treats this with Miralax and milk of magnesia along with Senokot one tablet a day.  She denies any fevers, chills, nausea, vomiting, diarrhea, skin/nail changes or any further concerns.   REVIEW OF SYSTEMS:   A 10 point review of systems was conducted and is otherwise negative except for what is noted above.    Past Medical History  Diagnosis Date  . Headache   . Cancer     breast   Past Surgical History  Procedure Laterality Date  . Abdominal hysterectomy    . Finger surgery    . Portacath placement N/A 12/19/2013    Procedure: INSERTION PORT-A-CATH;  Surgeon: Autumn Messing III, MD;  Location: [place];  Service: General;  Laterality: N/A;   History   Social History  . Marital Status: Married    Spouse Name: N/A    Number of Children: N/A  . Years of Education: N/A   Social History Main Topics  . Smoking status: Never Smoker   . Smokeless tobacco: Current User    Types: Snuff  . Alcohol Use: Yes     Comment: occasionally  . Drug Use: No  . Sexual Activity: Not on file   Other Topics Concern  . Not on file   Social History Narrative   No family history on file.   ALLERGIES:  has No Known Allergies.  MEDICATIONS:  Current Outpatient Prescriptions  Medication Sig Dispense Refill  . atorvastatin (LIPITOR) 20 MG tablet Take 20 mg by mouth daily.    . calcium carbonate (OS-CAL) 600 MG TABS tablet Take 600 mg by mouth daily with breakfast.    . dexamethasone (DECADRON) 4 MG tablet Take 2 tablets (8 mg total) by mouth 2 (two) times daily. Start the day before Taxotere. Take once the day after, then 2 times a day x 2d. 30 tablet 1  . lidocaine-prilocaine (EMLA) cream Apply 1 application topically as needed. 30 g 0  . LORazepam (ATIVAN) 0.5 MG tablet Take 1 tablet (0.5 mg total)  by mouth every 6 (six) hours as needed (Nausea or vomiting). 30 tablet 0  . ondansetron (ZOFRAN) 8 MG tablet Take 1 tablet (8 mg total) by mouth 2 (two) times daily as needed (Nausea or vomiting). Begin 4 days after chemotherapy. 30 tablet 1  . prochlorperazine (COMPAZINE) 10 MG tablet Take 1 tablet (10 mg total) by mouth every 6 (six) hours as needed (Nausea or vomiting). 30 tablet 1  . oxyCODONE-acetaminophen (PERCOCET/ROXICET) 5-325 MG per tablet Take 1 tablet by mouth every 4 (four) hours as needed for severe pain. (Patient not taking: Reported on 03/03/2014) 30 tablet 0   No current facility-administered medications for this visit.    PHYSICAL EXAMINATION: ECOG PERFORMANCE STATUS: 1 - Symptomatic but completely ambulatory  Filed Vitals:   03/03/14 0903  BP: 112/68  Pulse: 85  Temp: 98.4 F (36.9 C)  Resp: 18   Filed Weights   03/03/14 0903  Weight: 198 lb 6.4 oz (89.994 kg)  GENERAL: Patient is a well appearing female in no acute distress HEENT:  Sclerae anicteric.  Oropharynx clear and moist. No ulcerations or evidence of oropharyngeal candidiasis. Neck is supple.  NODES:  No cervical, supraclavicular, or axillary lymphadenopathy palpated.  BREAST EXAM:  Deferred. LUNGS:  Clear to auscultation bilaterally.  No wheezes or rhonchi. HEART:  Regular rate and rhythm. No murmur appreciated. ABDOMEN:  Soft, nontender.  Positive, normoactive bowel sounds. No organomegaly palpated. MSK:  No focal spinal tenderness to palpation. Full range of motion bilaterally in the upper extremities. EXTREMITIES:  No peripheral edema.   SKIN:  Hyperpigmentation on hands, hyperpigmentation noted at the base of the nail bed, no brittleness noted. NEURO:  Nonfocal. Well oriented.  Appropriate affect.     LABORATORY DATA:  I have reviewed the data as listed   Chemistry      Component Value Date/Time   NA 142 03/03/2014 0828   K 4.2 03/03/2014 0828   CO2 19* 03/03/2014 0828   BUN 9.9 03/03/2014  0828   CREATININE 0.8 03/03/2014 0828      Component Value Date/Time   CALCIUM 9.6 03/03/2014 0828   ALKPHOS 83 03/03/2014 0828   AST 10 03/03/2014 0828   ALT 10 03/03/2014 0828   BILITOT 0.36 03/03/2014 0828       Lab Results  Component Value Date   WBC 8.7 03/03/2014   HGB 10.4* 03/03/2014   HCT 32.3* 03/03/2014   MCV 88.3 03/03/2014   PLT 328 03/03/2014   NEUTROABS 7.2* 03/03/2014     RADIOGRAPHIC STUDIES: I have personally reviewed the radiology reports and agreed with their findings. No results found.   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   1. Chemotherapy with Taxotere and Cytoxan beginning 12/30/13. Patient receives this on day 1 of a 21 day cycle with Neualsta given on day  2. She will receive a total of 6 cycles and CT chest and mammo/ultrasound after cycle 3 demonstrated improvement with her breast cancer.    2. Bone Metastases, Zometa every 3 months, Calcium and Vitamin D supplementation.  Last received 02/10/14, due again on 05/05/13.    PLAN:  Karen Douglas is doing well today. Her lab work is stable.  I reviewed it with her in detail.  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  She will proceed with cycle 4 of chemotherapy with Taxotere/Cytoxan today.  She will continue on her bowel regimen for constipation as this is working for her.    For the fatigue, this is related to the chemotherapy and mild anemia.  I recommended short ten minute walks twice a day.    She is applying tea tree oil for her nails and they are doing well.    She will be seen by Dr. Lindi Adie next week on 03/10/14 for labs and evaluation of chemotoxicities.  She will need cycle 5 and 6 along with nadir check appointments scheduled at that time as well.    The patient has a good understanding of the overall plan. she agrees with it. She will call with any problems that may develop before her next visit here.  I spent 25 minutes  counseling the patient face to face. The total time spent in the appointment was 30 minutes and more than 50% was on counseling and review of test results   Minette Headland, [place] [phone] 03/03/2014 9:10 AM

## 2014-03-03 NOTE — Progress Notes (Signed)
Put disability form on nurse's desk. °

## 2014-03-03 NOTE — Patient Instructions (Signed)
Cyclophosphamide injection What is this medicine? CYCLOPHOSPHAMIDE (sye kloe FOSS fa mide) is a chemotherapy drug. It slows the growth of cancer cells. This medicine is used to treat many types of cancer like lymphoma, myeloma, leukemia, breast cancer, and ovarian cancer, to name a few. This medicine may be used for other purposes; ask your health care provider or pharmacist if you have questions. COMMON BRAND NAME(S): Cytoxan, Neosar What should I tell my health care provider before I take this medicine? They need to know if you have any of these conditions: -blood disorders -history of other chemotherapy -infection -kidney disease -liver disease -recent or ongoing radiation therapy -tumors in the bone marrow -an unusual or allergic reaction to cyclophosphamide, other chemotherapy, other medicines, foods, dyes, or preservatives -pregnant or trying to get pregnant -breast-feeding How should I use this medicine? This drug is usually given as an injection into a vein or muscle or by infusion into a vein. It is administered in a hospital or clinic by a specially trained health care professional. Talk to your pediatrician regarding the use of this medicine in children. Special care may be needed. Overdosage: If you think you have taken too much of this medicine contact a poison control center or emergency room at once. NOTE: This medicine is only for you. Do not share this medicine with others. What if I miss a dose? It is important not to miss your dose. Call your doctor or health care professional if you are unable to keep an appointment. What may interact with this medicine? This medicine may interact with the following medications: -amiodarone -amphotericin B -azathioprine -certain antiviral medicines for HIV or AIDS such as protease inhibitors (e.g., indinavir, ritonavir) and zidovudine -certain blood pressure medications such as benazepril, captopril, enalapril, fosinopril,  lisinopril, moexipril, monopril, perindopril, quinapril, ramipril, trandolapril -certain cancer medications such as anthracyclines (e.g., daunorubicin, doxorubicin), busulfan, cytarabine, paclitaxel, pentostatin, tamoxifen, trastuzumab -certain diuretics such as chlorothiazide, chlorthalidone, hydrochlorothiazide, indapamide, metolazone -certain medicines that treat or prevent blood clots like warfarin -certain muscle relaxants such as succinylcholine -cyclosporine -etanercept -indomethacin -medicines to increase blood counts like filgrastim, pegfilgrastim, sargramostim -medicines used as general anesthesia -metronidazole -natalizumab This list may not describe all possible interactions. Give your health care provider a list of all the medicines, herbs, non-prescription drugs, or dietary supplements you use. Also tell them if you smoke, drink alcohol, or use illegal drugs. Some items may interact with your medicine. What should I watch for while using this medicine? Visit your doctor for checks on your progress. This drug may make you feel generally unwell. This is not uncommon, as chemotherapy can affect healthy cells as well as cancer cells. Report any side effects. Continue your course of treatment even though you feel ill unless your doctor tells you to stop. Drink water or other fluids as directed. Urinate often, even at night. In some cases, you may be given additional medicines to help with side effects. Follow all directions for their use. Call your doctor or health care professional for advice if you get a fever, chills or sore throat, or other symptoms of a cold or flu. Do not treat yourself. This drug decreases your body's ability to fight infections. Try to avoid being around people who are sick. This medicine may increase your risk to bruise or bleed. Call your doctor or health care professional if you notice any unusual bleeding. Be careful brushing and flossing your teeth or using a  toothpick because you may get an infection or bleed   more easily. If you have any dental work done, tell your dentist you are receiving this medicine. You may get drowsy or dizzy. Do not drive, use machinery, or do anything that needs mental alertness until you know how this medicine affects you. Do not become pregnant while taking this medicine or for 1 year after stopping it. Women should inform their doctor if they wish to become pregnant or think they might be pregnant. Men should not father a child while taking this medicine and for 4 months after stopping it. There is a potential for serious side effects to an unborn child. Talk to your health care professional or pharmacist for more information. Do not breast-feed an infant while taking this medicine. This medicine may interfere with the ability to have a child. This medicine has caused ovarian failure in some women. This medicine has caused reduced sperm counts in some men. You should talk with your doctor or health care professional if you are concerned about your fertility. If you are going to have surgery, tell your doctor or health care professional that you have taken this medicine. What side effects may I notice from receiving this medicine? Side effects that you should report to your doctor or health care professional as soon as possible: -allergic reactions like skin rash, itching or hives, swelling of the face, lips, or tongue -low blood counts - this medicine may decrease the number of white blood cells, red blood cells and platelets. You may be at increased risk for infections and bleeding. -signs of infection - fever or chills, cough, sore throat, pain or difficulty passing urine -signs of decreased platelets or bleeding - bruising, pinpoint red spots on the skin, black, tarry stools, blood in the urine -signs of decreased red blood cells - unusually weak or tired, fainting spells, lightheadedness -breathing problems -dark  urine -dizziness -palpitations -swelling of the ankles, feet, hands -trouble passing urine or change in the amount of urine -weight gain -yellowing of the eyes or skin Side effects that usually do not require medical attention (report to your doctor or health care professional if they continue or are bothersome): -changes in nail or skin color -hair loss -missed menstrual periods -mouth sores -nausea, vomiting This list may not describe all possible side effects. Call your doctor for medical advice about side effects. You may report side effects to FDA at 1-800-FDA-1088. Where should I keep my medicine? This drug is given in a hospital or clinic and will not be stored at home. NOTE: This sheet is a summary. It may not cover all possible information. If you have questions about this medicine, talk to your doctor, pharmacist, or health care provider.  2015, Elsevier/Gold Standard. (2012-02-06 16:22:58) Docetaxel injection What is this medicine? DOCETAXEL (doe se TAX el) is a chemotherapy drug. It targets fast dividing cells, like cancer cells, and causes these cells to die. This medicine is used to treat many types of cancers like breast cancer, certain stomach cancers, head and neck cancer, lung cancer, and prostate cancer. This medicine may be used for other purposes; ask your health care provider or pharmacist if you have questions. COMMON BRAND NAME(S): Docefrez, Taxotere What should I tell my health care provider before I take this medicine? They need to know if you have any of these conditions: -infection (especially a virus infection such as chickenpox, cold sores, or herpes) -liver disease -low blood counts, like low white cell, platelet, or red cell counts -an unusual or allergic reaction to docetaxel,   polysorbate 80, other chemotherapy agents, other medicines, foods, dyes, or preservatives -pregnant or trying to get pregnant -breast-feeding How should I use this  medicine? This drug is given as an infusion into a vein. It is administered in a hospital or clinic by a specially trained health care professional. Talk to your pediatrician regarding the use of this medicine in children. Special care may be needed. Overdosage: If you think you have taken too much of this medicine contact a poison control center or emergency room at once. NOTE: This medicine is only for you. Do not share this medicine with others. What if I miss a dose? It is important not to miss your dose. Call your doctor or health care professional if you are unable to keep an appointment. What may interact with this medicine? -cyclosporine -erythromycin -ketoconazole -medicines to increase blood counts like filgrastim, pegfilgrastim, sargramostim -vaccines Talk to your doctor or health care professional before taking any of these medicines: -acetaminophen -aspirin -ibuprofen -ketoprofen -naproxen This list may not describe all possible interactions. Give your health care provider a list of all the medicines, herbs, non-prescription drugs, or dietary supplements you use. Also tell them if you smoke, drink alcohol, or use illegal drugs. Some items may interact with your medicine. What should I watch for while using this medicine? Your condition will be monitored carefully while you are receiving this medicine. You will need important blood work done while you are taking this medicine. This drug may make you feel generally unwell. This is not uncommon, as chemotherapy can affect healthy cells as well as cancer cells. Report any side effects. Continue your course of treatment even though you feel ill unless your doctor tells you to stop. In some cases, you may be given additional medicines to help with side effects. Follow all directions for their use. Call your doctor or health care professional for advice if you get a fever, chills or sore throat, or other symptoms of a cold or flu. Do  not treat yourself. This drug decreases your body's ability to fight infections. Try to avoid being around people who are sick. This medicine may increase your risk to bruise or bleed. Call your doctor or health care professional if you notice any unusual bleeding. Be careful brushing and flossing your teeth or using a toothpick because you may get an infection or bleed more easily. If you have any dental work done, tell your dentist you are receiving this medicine. Avoid taking products that contain aspirin, acetaminophen, ibuprofen, naproxen, or ketoprofen unless instructed by your doctor. These medicines may hide a fever. This medicine contains an alcohol in the product. You may get drowsy or dizzy. Do not drive, use machinery, or do anything that needs mental alertness until you know how this medicine affects you. Do not stand or sit up quickly, especially if you are an older patient. This reduces the risk of dizzy or fainting spells. Avoid alcoholic drinks Do not become pregnant while taking this medicine. Women should inform their doctor if they wish to become pregnant or think they might be pregnant. There is a potential for serious side effects to an unborn child. Talk to your health care professional or pharmacist for more information. Do not breast-feed an infant while taking this medicine. What side effects may I notice from receiving this medicine? Side effects that you should report to your doctor or health care professional as soon as possible: -allergic reactions like skin rash, itching or hives, swelling of the face,   lips, or tongue -low blood counts - This drug may decrease the number of white blood cells, red blood cells and platelets. You may be at increased risk for infections and bleeding. -signs of infection - fever or chills, cough, sore throat, pain or difficulty passing urine -signs of decreased platelets or bleeding - bruising, pinpoint red spots on the skin, black, tarry stools,  nosebleeds -signs of decreased red blood cells - unusually weak or tired, fainting spells, lightheadedness -breathing problems -fast or irregular heartbeat -low blood pressure -mouth sores -nausea and vomiting -pain, swelling, redness or irritation at the injection site -pain, tingling, numbness in the hands or feet -swelling of the ankle, feet, hands -weight gain Side effects that usually do not require medical attention (report to your prescriber or health care professional if they continue or are bothersome): -bone pain -complete hair loss including hair on your head, underarms, pubic hair, eyebrows, and eyelashes -diarrhea -excessive tearing -changes in the color of fingernails -loosening of the fingernails -nausea -muscle pain -red flush to skin -sweating -weak or tired This list may not describe all possible side effects. Call your doctor for medical advice about side effects. You may report side effects to FDA at 1-800-FDA-1088. Where should I keep my medicine? This drug is given in a hospital or clinic and will not be stored at home. NOTE: This sheet is a summary. It may not cover all possible information. If you have questions about this medicine, talk to your doctor, pharmacist, or health care provider.  2015, Elsevier/Gold Standard. (2013-02-17 22:21:02)  

## 2014-03-04 ENCOUNTER — Ambulatory Visit (HOSPITAL_BASED_OUTPATIENT_CLINIC_OR_DEPARTMENT_OTHER): Payer: 59

## 2014-03-04 DIAGNOSIS — C50511 Malignant neoplasm of 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: Secondary | ICD-10-CM

## 2014-03-04 DIAGNOSIS — Z5189 Encounter for other specified aftercare: Secondary | ICD-10-CM

## 2014-03-04 MED ORDER — PEGFILGRASTIM INJECTION 6 MG/0.6ML ~~LOC~~
6.0000 mg | PREFILLED_SYRINGE | Freq: Once | SUBCUTANEOUS | Status: AC
  Administered 2014-03-04: 6 mg via SUBCUTANEOUS

## 2014-03-04 NOTE — Progress Notes (Signed)
Pt in for Neulasta injection. BP low. Asymptomatic. Instructed her to push PO fluids, call office with any issues. She voiced understanding.

## 2014-03-07 ENCOUNTER — Encounter: Payer: Self-pay | Admitting: Hematology and Oncology

## 2014-03-07 NOTE — Progress Notes (Signed)
Faxed disability form to [phone]

## 2014-03-10 ENCOUNTER — Ambulatory Visit (HOSPITAL_BASED_OUTPATIENT_CLINIC_OR_DEPARTMENT_OTHER): Payer: 59 | Admitting: Hematology and Oncology

## 2014-03-10 ENCOUNTER — Other Ambulatory Visit (HOSPITAL_BASED_OUTPATIENT_CLINIC_OR_DEPARTMENT_OTHER): Payer: 59

## 2014-03-10 ENCOUNTER — Ambulatory Visit: Payer: 59 | Admitting: Adult Health

## 2014-03-10 ENCOUNTER — Telehealth: Payer: Self-pay | Admitting: Hematology and Oncology

## 2014-03-10 ENCOUNTER — Other Ambulatory Visit: Payer: 59

## 2014-03-10 ENCOUNTER — Telehealth: Payer: Self-pay | Admitting: *Deleted

## 2014-03-10 DIAGNOSIS — C50511 Malignant neoplasm of 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: Secondary | ICD-10-CM

## 2014-03-10 DIAGNOSIS — C7800 Secondary malignant neoplasm of unspecified lung: Secondary | ICD-10-CM

## 2014-03-10 DIAGNOSIS — C50811 Malignant neoplasm of overlapping sites of right female breast: Secondary | ICD-10-CM

## 2014-03-10 DIAGNOSIS — L659 Nonscarring hair loss, unspecified: Secondary | ICD-10-CM

## 2014-03-10 DIAGNOSIS — D6481 Anemia due to antineoplastic chemotherapy: Secondary | ICD-10-CM

## 2014-03-10 DIAGNOSIS — D702 Other drug-induced agranulocytosis: Secondary | ICD-10-CM

## 2014-03-10 DIAGNOSIS — C7951 Secondary malignant neoplasm of bone: Secondary | ICD-10-CM

## 2014-03-10 DIAGNOSIS — D701 Agranulocytosis secondary to cancer chemotherapy: Secondary | ICD-10-CM

## 2014-03-10 DIAGNOSIS — R5383 Other fatigue: Secondary | ICD-10-CM

## 2014-03-10 LAB — COMPREHENSIVE METABOLIC PANEL (CC13)
ALBUMIN: 3.1 g/dL — AB (ref 3.5–5.0)
ALT: 12 U/L (ref 0–55)
AST: 12 U/L (ref 5–34)
Alkaline Phosphatase: 73 U/L (ref 40–150)
Anion Gap: 9 mEq/L (ref 3–11)
BILIRUBIN TOTAL: 0.58 mg/dL (ref 0.20–1.20)
BUN: 7.4 mg/dL (ref 7.0–26.0)
CO2: 23 mEq/L (ref 22–29)
Calcium: 8.5 mg/dL (ref 8.4–10.4)
Chloride: 109 mEq/L (ref 98–109)
Creatinine: 0.8 mg/dL (ref 0.6–1.1)
GLUCOSE: 85 mg/dL (ref 70–140)
Potassium: 4.5 mEq/L (ref 3.5–5.1)
Sodium: 141 mEq/L (ref 136–145)
Total Protein: 6.1 g/dL — ABNORMAL LOW (ref 6.4–8.3)

## 2014-03-10 LAB — CBC WITH DIFFERENTIAL/PLATELET
BASO%: 1 % (ref 0.0–2.0)
BASOS ABS: 0 10*3/uL (ref 0.0–0.1)
EOS%: 0.3 % (ref 0.0–7.0)
Eosinophils Absolute: 0 10*3/uL (ref 0.0–0.5)
HEMATOCRIT: 30.4 % — AB (ref 34.8–46.6)
HGB: 9.8 g/dL — ABNORMAL LOW (ref 11.6–15.9)
LYMPH%: 26.2 % (ref 14.0–49.7)
MCH: 28.2 pg (ref 25.1–34.0)
MCHC: 32.2 g/dL (ref 31.5–36.0)
MCV: 87.6 fL (ref 79.5–101.0)
MONO#: 1.1 10*3/uL — ABNORMAL HIGH (ref 0.1–0.9)
MONO%: 38.1 % — AB (ref 0.0–14.0)
NEUT#: 1 10*3/uL — ABNORMAL LOW (ref 1.5–6.5)
NEUT%: 34.4 % — AB (ref 38.4–76.8)
PLATELETS: 261 10*3/uL (ref 145–400)
RBC: 3.47 10*6/uL — ABNORMAL LOW (ref 3.70–5.45)
RDW: 19.1 % — AB (ref 11.2–14.5)
WBC: 2.9 10*3/uL — ABNORMAL LOW (ref 3.9–10.3)
lymph#: 0.8 10*3/uL — ABNORMAL LOW (ref 0.9–3.3)
nRBC: 1 % — ABNORMAL HIGH (ref 0–0)

## 2014-03-10 NOTE — Progress Notes (Signed)
Patient Care Team: Iona Beard, MD as PCP - General (Family Medicine) Autumn Messing III, MD as Consulting Physician (General Surgery) Rulon Eisenmenger, MD as Consulting Physician (Hematology and Oncology) Rexene Edison, MD as Consulting Physician (Radiation Oncology)  DIAGNOSIS: Breast cancer of 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   Staging form: Breast, AJCC 7th Edition     Clinical: Stage IV (T4b, N1, M1) - Signed by Rulon Eisenmenger, MD on 12/30/2013     Pathologic: No stage assigned - Unsigned   SUMMARY OF ONCOLOGIC HISTORY:   Breast cancer of 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   12/01/2013 Initial Diagnosis Breast cancer of 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   12/06/2013 Breast MRI Enhancing tumor throughout the right breast all quadrants with diffuse skin thickening 11.5 x 11 x 5 cm: Spiculated mass posterior UOQ right breast 2.6 x 2.5 x 2 cm and abuts pectoralis muscle: 2 abnormal axillary lymph nodes largest 2.2 cm   12/20/2013 PET scan Stage IV disease noted with 1. Diffuse hypermetabolic right breast inflammatory carcinoma.  Metastatic lymphadenopathy in the right axilla, mediastinum, and bilateral hilar regions, small bilateral pulmonary metastases, possible 9th rib metastases   12/30/2013 -  Chemotherapy Taxotere and cytoxan given on day 1 of a 21 day cycle with neulasta given on day 2 for granulocyte support.  A total of 6 cycles are planned with CT chest and mammo/ultrasound planned after cycle 3.     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 She completed 4 cycles of chemotherapy. She is feeling a lot of fatigue and weakness. She denies any nausea vomiting fevers or chills. The breast itself is remarkably improved  from the effects of chemotherapy. CT scans after 3 cycles revealed that the lung nodules have completely disappeared.  REVIEW OF SYSTEMS:   Constitutional: Denies fevers, chills or abnormal weight loss Eyes: Denies blurriness of vision Ears, nose, mouth, throat, and face: Denies mucositis or sore throat Respiratory: Denies cough, dyspnea or wheezes Cardiovascular: Denies palpitation, chest discomfort or lower extremity swelling Gastrointestinal:  Denies nausea, heartburn or change in bowel habits Skin: Denies abnormal skin rashes Lymphatics: Denies new lymphadenopathy or easy bruising Neurological:Denies numbness, tingling or new weaknesses Behavioral/Psych: Mood is stable, no new changes  Breast: right breast markedly improved All other systems were reviewed with the patient and are negative.  I have reviewed the past medical history, past surgical history, social history and family history with the patient and they are unchanged from previous note.  ALLERGIES:  has No Known Allergies.  MEDICATIONS:  Current Outpatient Prescriptions  Medication Sig Dispense Refill  . atorvastatin (LIPITOR) 20 MG tablet Take 20 mg by mouth daily.    . calcium carbonate (OS-CAL) 600 MG TABS tablet Take 600 mg by mouth daily with breakfast.    . dexamethasone (DECADRON) 4 MG tablet Take 2 tablets (8 mg total) by mouth 2 (two) times daily. Start the day before Taxotere. Take once the day after, then 2 times a day x 2d. 30 tablet 1  . lidocaine-prilocaine (EMLA) cream Apply 1 application topically as needed. 30 g 0  . LORazepam (ATIVAN) 0.5 MG tablet Take 1 tablet (0.5 mg total) by mouth every 6 (six) hours as needed (Nausea or vomiting). 30 tablet 0  .  ondansetron (ZOFRAN) 8 MG tablet Take 1 tablet (8 mg total) by mouth 2 (two) times daily as needed (Nausea or vomiting). Begin 4 days after chemotherapy. 30 tablet 1  . oxyCODONE-acetaminophen (PERCOCET/ROXICET) 5-325 MG per tablet Take 1 tablet by mouth  every 4 (four) hours as needed for severe pain. 30 tablet 0  . prochlorperazine (COMPAZINE) 10 MG tablet Take 1 tablet (10 mg total) by mouth every 6 (six) hours as needed (Nausea or vomiting). 30 tablet 1   No current facility-administered medications for this visit.    PHYSICAL EXAMINATION: ECOG PERFORMANCE STATUS: 1 - Symptomatic but completely ambulatory  Filed Vitals:   03/10/14 1003  BP: 106/61  Pulse: 106  Temp: 98.2 F (36.8 C)  Resp: 18   Filed Weights   03/10/14 1003  Weight: 203 lb (92.08 kg)    GENERAL:alert, no distress and comfortable SKIN: skin color, texture, turgor are normal, no rashes or significant lesions EYES: normal, Conjunctiva are pink and non-injected, sclera clear OROPHARYNX:no exudate, no erythema and lips, buccal mucosa, and tongue normal  NECK: supple, thyroid normal size, non-tender, without nodularity LYMPH:  no palpable lymphadenopathy in the cervical, axillary or inguinal LUNGS: clear to auscultation and percussion with normal breathing effort HEART: regular rate & rhythm and no murmurs and no lower extremity edema ABDOMEN:abdomen soft, non-tender and normal bowel sounds Musculoskeletal:no cyanosis of digits and no clubbing  NEURO: alert & oriented x 3 with fluent speech, no focal motor/sensory deficits BREAST:breast exam revealed significant improvement in the inflammatory change of the right breast no palpable masses other than some periareolar thickening  LABORATORY DATA:  I have reviewed the data as listed   Chemistry      Component Value Date/Time   NA 142 03/03/2014 0828   K 4.2 03/03/2014 0828   CO2 19* 03/03/2014 0828   BUN 9.9 03/03/2014 0828   CREATININE 0.8 03/03/2014 0828      Component Value Date/Time   CALCIUM 9.6 03/03/2014 0828   ALKPHOS 83 03/03/2014 0828   AST 10 03/03/2014 0828   ALT 10 03/03/2014 0828   BILITOT 0.36 03/03/2014 0828       Lab Results  Component Value Date   WBC 8.7 03/03/2014   HGB 10.4*  03/03/2014   HCT 32.3* 03/03/2014   MCV 88.3 03/03/2014   PLT 328 03/03/2014   NEUTROABS 7.2* 03/03/2014     RADIOGRAPHIC STUDIES: I have personally reviewed the radiology reports and agreed with their findings. 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 CT scans done of the chest after 3 cycles of chemotherapy revealed resolution of lung nodules. Clinically marked improvement in inflammatory breast cancer.once she completes chemotherapy, depending on the response, we may elect to radiate the ninth rib and all any minimal residual disease. She was then restarted on antiestrogen therapy for maintenance.  Toxicities  of chemotherapy 1. Chemotherapy-induced fatigue 2. Alopecia 3. Chemotherapy-induced leukopenia and neutropenia 4. Chemotherapy-induced anemia Monitoring closely for toxicities Return to clinic in 2 weeks for cycle 5     Orders Placed This Encounter  Procedures  . CBC with Differential    Standing Status: Future     Number of Occurrences:      Standing Expiration Date: 03/10/2015  . Comprehensive metabolic panel (Cmet) - [facility]    Standing Status: Future     Number of Occurrences:      Standing Expiration Date: 03/10/2015   The patient has a good understanding of the overall plan. she agrees  with it. She will call with any problems that may develop before her next visit here.   Gudena, Vinay K, MD 03/10/2014 11:04 AM   

## 2014-03-10 NOTE — Telephone Encounter (Signed)
Per staff message and POF I have scheduled appts. Advised scheduler of appts. JMW  

## 2014-03-10 NOTE — Assessment & Plan Note (Signed)
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 CT scans done of the chest after 3 cycles of chemotherapy revealed resolution of lung nodules. Clinically marked improvement in inflammatory breast cancer.once she completes chemotherapy, depending on the response, we may elect to radiate the ninth rib and all any minimal residual disease. She was then restarted on antiestrogen therapy for maintenance.  Toxicities of chemotherapy 1. Chemotherapy-induced fatigue 2. Alopecia 3. Chemotherapy-induced leukopenia and neutropenia 4. Chemotherapy-induced anemia Monitoring closely for toxicities Return to clinic in 2 weeks for cycle 5

## 2014-03-10 NOTE — Telephone Encounter (Signed)
, °

## 2014-03-24 ENCOUNTER — Other Ambulatory Visit (HOSPITAL_BASED_OUTPATIENT_CLINIC_OR_DEPARTMENT_OTHER): Payer: 59

## 2014-03-24 ENCOUNTER — Ambulatory Visit (HOSPITAL_BASED_OUTPATIENT_CLINIC_OR_DEPARTMENT_OTHER): Payer: 59

## 2014-03-24 ENCOUNTER — Telehealth: Payer: Self-pay | Admitting: Hematology and Oncology

## 2014-03-24 ENCOUNTER — Encounter: Payer: Self-pay | Admitting: *Deleted

## 2014-03-24 ENCOUNTER — Ambulatory Visit (HOSPITAL_BASED_OUTPATIENT_CLINIC_OR_DEPARTMENT_OTHER): Payer: 59 | Admitting: Hematology and Oncology

## 2014-03-24 VITALS — BP 113/69 | HR 89 | Temp 97.7°F | Resp 18 | Ht 61.0 in | Wt 195.5 lb

## 2014-03-24 DIAGNOSIS — C7801 Secondary malignant neoplasm of right lung: Secondary | ICD-10-CM

## 2014-03-24 DIAGNOSIS — C50811 Malignant neoplasm of overlapping sites of right female breast: Secondary | ICD-10-CM

## 2014-03-24 DIAGNOSIS — C7951 Secondary malignant neoplasm of bone: Secondary | ICD-10-CM

## 2014-03-24 DIAGNOSIS — Z5111 Encounter for antineoplastic chemotherapy: Secondary | ICD-10-CM

## 2014-03-24 DIAGNOSIS — C7802 Secondary malignant neoplasm of left lung: Secondary | ICD-10-CM

## 2014-03-24 DIAGNOSIS — C50511 Malignant neoplasm of 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: Secondary | ICD-10-CM

## 2014-03-24 LAB — CBC WITH DIFFERENTIAL/PLATELET
BASO%: 0.1 % (ref 0.0–2.0)
Basophils Absolute: 0 10*3/uL (ref 0.0–0.1)
EOS%: 0.1 % (ref 0.0–7.0)
Eosinophils Absolute: 0 10*3/uL (ref 0.0–0.5)
HCT: 30.9 % — ABNORMAL LOW (ref 34.8–46.6)
HGB: 9.9 g/dL — ABNORMAL LOW (ref 11.6–15.9)
LYMPH#: 0.9 10*3/uL (ref 0.9–3.3)
LYMPH%: 10.8 % — ABNORMAL LOW (ref 14.0–49.7)
MCH: 28.2 pg (ref 25.1–34.0)
MCHC: 32 g/dL (ref 31.5–36.0)
MCV: 88 fL (ref 79.5–101.0)
MONO#: 0.7 10*3/uL (ref 0.1–0.9)
MONO%: 9.3 % (ref 0.0–14.0)
NEUT#: 6.3 10*3/uL (ref 1.5–6.5)
NEUT%: 79.7 % — ABNORMAL HIGH (ref 38.4–76.8)
Platelets: 378 10*3/uL (ref 145–400)
RBC: 3.51 10*6/uL — ABNORMAL LOW (ref 3.70–5.45)
RDW: 20.3 % — AB (ref 11.2–14.5)
WBC: 7.9 10*3/uL (ref 3.9–10.3)

## 2014-03-24 LAB — COMPREHENSIVE METABOLIC PANEL (CC13)
ALBUMIN: 3.4 g/dL — AB (ref 3.5–5.0)
ALK PHOS: 76 U/L (ref 40–150)
ALT: 10 U/L (ref 0–55)
AST: 12 U/L (ref 5–34)
Anion Gap: 11 mEq/L (ref 3–11)
BILIRUBIN TOTAL: 0.34 mg/dL (ref 0.20–1.20)
BUN: 13.6 mg/dL (ref 7.0–26.0)
CO2: 20 mEq/L — ABNORMAL LOW (ref 22–29)
Calcium: 9.3 mg/dL (ref 8.4–10.4)
Chloride: 110 mEq/L — ABNORMAL HIGH (ref 98–109)
Creatinine: 0.9 mg/dL (ref 0.6–1.1)
EGFR: 86 mL/min/{1.73_m2} — ABNORMAL LOW (ref >90)
GLUCOSE: 111 mg/dL (ref 70–140)
POTASSIUM: 4.5 meq/L (ref 3.5–5.1)
SODIUM: 142 meq/L (ref 136–145)
TOTAL PROTEIN: 7 g/dL (ref 6.4–8.3)

## 2014-03-24 MED ORDER — SODIUM CHLORIDE 0.9 % IJ SOLN
10.0000 mL | INTRAMUSCULAR | Status: DC | PRN
  Administered 2014-03-24: 10 mL
  Filled 2014-03-24: qty 10

## 2014-03-24 MED ORDER — DEXAMETHASONE SODIUM PHOSPHATE 20 MG/5ML IJ SOLN
20.0000 mg | Freq: Once | INTRAMUSCULAR | Status: AC
  Administered 2014-03-24: 20 mg via INTRAVENOUS

## 2014-03-24 MED ORDER — ONDANSETRON 16 MG/50ML IVPB (CHCC)
INTRAVENOUS | Status: AC
  Filled 2014-03-24: qty 16

## 2014-03-24 MED ORDER — DEXAMETHASONE SODIUM PHOSPHATE 20 MG/5ML IJ SOLN
INTRAMUSCULAR | Status: AC
  Filled 2014-03-24: qty 5

## 2014-03-24 MED ORDER — ONDANSETRON 16 MG/50ML IVPB (CHCC)
16.0000 mg | Freq: Once | INTRAVENOUS | Status: AC
  Administered 2014-03-24: 16 mg via INTRAVENOUS

## 2014-03-24 MED ORDER — SODIUM CHLORIDE 0.9 % IV SOLN
600.0000 mg/m2 | Freq: Once | INTRAVENOUS | Status: AC
  Administered 2014-03-24: 1180 mg via INTRAVENOUS
  Filled 2014-03-24: qty 59

## 2014-03-24 MED ORDER — SODIUM CHLORIDE 0.9 % IV SOLN
Freq: Once | INTRAVENOUS | Status: AC
  Administered 2014-03-24: 09:00:00 via INTRAVENOUS

## 2014-03-24 MED ORDER — HEPARIN SOD (PORK) LOCK FLUSH 100 UNIT/ML IV SOLN
500.0000 [IU] | Freq: Once | INTRAVENOUS | Status: AC | PRN
  Administered 2014-03-24: 500 [IU]
  Filled 2014-03-24: qty 5

## 2014-03-24 MED ORDER — DOCETAXEL CHEMO INJECTION 160 MG/16ML
60.0000 mg/m2 | Freq: Once | INTRAVENOUS | Status: AC
  Administered 2014-03-24: 120 mg via INTRAVENOUS
  Filled 2014-03-24: qty 12

## 2014-03-24 NOTE — Progress Notes (Signed)
Pt received her copy of signed disability forms.

## 2014-03-24 NOTE — Patient Instructions (Signed)
[facility] Discharge Instructions for Patients Receiving Chemotherapy  Today you received the following chemotherapy agents: Taxotere, Cytoxan   To help prevent nausea and vomiting after your treatment, we encourage you to take your nausea medication as prescribed by your physician.   If you develop nausea and vomiting that is not controlled by your nausea medication, call the clinic.   BELOW ARE SYMPTOMS THAT SHOULD BE REPORTED IMMEDIATELY:  *FEVER GREATER THAN 100.5 F  *CHILLS WITH OR WITHOUT FEVER  NAUSEA AND VOMITING THAT IS NOT CONTROLLED WITH YOUR NAUSEA MEDICATION  *UNUSUAL SHORTNESS OF BREATH  *UNUSUAL BRUISING OR BLEEDING  TENDERNESS IN MOUTH AND THROAT WITH OR WITHOUT PRESENCE OF ULCERS  *URINARY PROBLEMS  *BOWEL PROBLEMS  UNUSUAL RASH Items with * indicate a potential emergency and should be followed up as soon as possible.  Feel free to call the clinic you have any questions or concerns. The clinic phone number is [phone].

## 2014-03-24 NOTE — Progress Notes (Signed)
Patient Care Team: Iona Beard, MD as PCP - General (Family Medicine) Autumn Messing III, MD as Consulting Physician (General Surgery) Rulon Eisenmenger, MD as Consulting Physician (Hematology and Oncology) Rexene Edison, MD as Consulting Physician (Radiation Oncology)  DIAGNOSIS: Breast cancer of 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   Staging form: Breast, AJCC 7th Edition     Clinical: Stage IV (T4b, N1, M1) - Signed by Rulon Eisenmenger, MD on 12/30/2013     Pathologic: No stage assigned - Unsigned   SUMMARY OF ONCOLOGIC HISTORY:   Breast cancer of 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   12/01/2013 Initial Diagnosis Breast cancer of 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   12/06/2013 Breast MRI Enhancing tumor throughout the right breast all quadrants with diffuse skin thickening 11.5 x 11 x 5 cm: Spiculated mass posterior UOQ right breast 2.6 x 2.5 x 2 cm and abuts pectoralis muscle: 2 abnormal axillary lymph nodes largest 2.2 cm   12/20/2013 PET scan Stage IV disease noted with 1. Diffuse hypermetabolic right breast inflammatory carcinoma.  Metastatic lymphadenopathy in the right axilla, mediastinum, and bilateral hilar regions, small bilateral pulmonary metastases, possible 9th rib metastases   12/30/2013 -  Chemotherapy Taxotere and cytoxan given on day 1 of a 21 day cycle with neulasta given on day 2 for granulocyte support.  A total of 6 cycles are planned with CT chest and mammo/ultrasound planned after cycle 3.     CHIEF COMPLIANT: Cycle 5 of Taxotere Cytoxan  INTERVAL HISTORY: Karen Douglas is a 58 year old of African-American female with above-mentioned history of stage IV breast cancer with a very large tumor in the right breast who is currently on chemotherapy with Taxotere and Cytoxan. Today cycle 5. She complains of fatigue and mild tingling of the tips of the fingers and toes. The fatigue gets worse after chemotherapy and then recovers a week before the next chemotherapy.  She denies any fevers or chills or nausea or vomiting. Her appetite is poor but she is eating reasonably well.  REVIEW OF SYSTEMS:   Constitutional: Denies fevers, chills or abnormal weight loss Eyes: Denies blurriness of vision Ears, nose, mouth, throat, and face: Denies mucositis or sore throat Respiratory: Denies cough, dyspnea or wheezes Cardiovascular: Denies palpitation, chest discomfort or lower extremity swelling Gastrointestinal:  Denies nausea, heartburn or change in bowel habits Skin: Denies abnormal skin rashes Lymphatics: Denies new lymphadenopathy or easy bruising Neurological:Denies numbness, tingling or new weaknesses Behavioral/Psych: Mood is stable, no new changes  Breast: Breast lumps have markedly improved All other systems were reviewed with the patient and are negative.  I have reviewed the past medical history, past surgical history, social history and family history with the patient and they are unchanged from previous note.  ALLERGIES:  has No Known Allergies.  MEDICATIONS:  Current Outpatient Prescriptions  Medication Sig Dispense Refill  . atorvastatin (LIPITOR) 20 MG tablet Take 20 mg by mouth daily.    . calcium carbonate (OS-CAL) 600 MG TABS tablet Take 600 mg by mouth daily with breakfast.    . dexamethasone (DECADRON) 4 MG tablet Take 2 tablets (8 mg total) by mouth 2 (two) times daily. Start the day before Taxotere. Take once the day after, then 2 times a day x 2d. 30 tablet 1  . lidocaine-prilocaine (EMLA) cream Apply 1 application topically as needed. 30 g 0  . LORazepam (ATIVAN) 0.5 MG tablet Take 1 tablet (0.5 mg total) by mouth every 6 (six) hours as needed (  Nausea or vomiting). 30 tablet 0  . ondansetron (ZOFRAN) 8 MG tablet Take 1 tablet (8 mg total) by mouth 2 (two) times daily as needed (Nausea or vomiting). Begin 4 days after chemotherapy. 30 tablet 1  . oxyCODONE-acetaminophen (PERCOCET/ROXICET) 5-325 MG per tablet Take 1 tablet by mouth every  4 (four) hours as needed for severe pain. 30 tablet 0  . prochlorperazine (COMPAZINE) 10 MG tablet Take 1 tablet (10 mg total) by mouth every 6 (six) hours as needed (Nausea or vomiting). 30 tablet 1   No current facility-administered medications for this visit.    PHYSICAL EXAMINATION: ECOG PERFORMANCE STATUS: 2 - Symptomatic, <50% confined to bed  Filed Vitals:   03/24/14 0810  BP: 113/69  Pulse: 89  Temp: 97.7 F (36.5 C)  Resp: 18   Filed Weights   03/24/14 0810  Weight: 195 lb 8 oz (88.678 kg)    GENERAL:alert, no distress and comfortable SKIN: skin color, texture, turgor are normal, no rashes or significant lesions EYES: normal, Conjunctiva are pink and non-injected, sclera clear OROPHARYNX:no exudate, no erythema and lips, buccal mucosa, and tongue normal  NECK: supple, thyroid normal size, non-tender, without nodularity LYMPH:  no palpable lymphadenopathy in the cervical, axillary or inguinal LUNGS: clear to auscultation and percussion with normal breathing effort HEART: regular rate & rhythm and no murmurs and no lower extremity edema ABDOMEN:abdomen soft, non-tender and normal bowel sounds Musculoskeletal:no cyanosis of digits and no clubbing  NEURO: alert & oriented x 3 with fluent speech, no focal motor/sensory deficits  LABORATORY DATA:  I have reviewed the data as listed   Chemistry      Component Value Date/Time   NA 141 03/10/2014 1010   K 4.5 03/10/2014 1010   CO2 23 03/10/2014 1010   BUN 7.4 03/10/2014 1010   CREATININE 0.8 03/10/2014 1010      Component Value Date/Time   CALCIUM 8.5 03/10/2014 1010   ALKPHOS 73 03/10/2014 1010   AST 12 03/10/2014 1010   ALT 12 03/10/2014 1010   BILITOT 0.58 03/10/2014 1010       Lab Results  Component Value Date   WBC 7.9 03/24/2014   HGB 9.9* 03/24/2014   HCT 30.9* 03/24/2014   MCV 88.0 03/24/2014   PLT 378 03/24/2014   NEUTROABS 6.3 03/24/2014    ASSESSMENT & PLAN:  Breast cancer of lower-outer  quadrant of right female breast 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 CT scans done of the chest after 3 cycles of chemotherapy revealed resolution of lung nodules. Clinically marked improvement in inflammatory breast cancer.once she completes chemotherapy, depending on the response, we may elect to radiate the ninth rib. She will then be restarted on antiestrogen therapy for maintenance.  Toxicities of chemotherapy 1. Chemotherapy-induced fatigue 2. Alopecia 3. Chemotherapy-induced leukopenia and neutropenia 4. Chemotherapy-induced anemia  Monitoring closely for toxicities Return to clinic in 3 weeks for cycle 6. After the conclusion of cycle 6, we will plan on doing a PET CT scan and a  breast MRI and present for the breast tumor board to make further treatment decisions.  PET CT scan and a breast MRI will be scheduled around January 20. We will then present her in the tumor board and make a decision regarding surgery and radiation treatments.  Orders Placed This Encounter  Procedures  . MR Breast Bilateral W Contrast    Standing Status: Future     Number of Occurrences:      Standing Expiration Date: 03/24/2015    Order Specific Question:  Reason for Exam (SYMPTOM  OR DIAGNOSIS REQUIRED)    Answer:  Post neo adjuvant chemo for breast cancer    Order Specific Question:  Preferred imaging location?    Answer:  [facility]    Order Specific Question:  Does the patient have  a pacemaker or implanted devices?    Answer:  No    Order Specific Question:  What is the patient's sedation requirement?    Answer:  No Sedation  . NM PET Image Restage (PS) Whole Body    Standing Status: Future     Number of Occurrences:      Standing Expiration Date: 03/24/2015    Order Specific Question:  Reason for Exam (SYMPTOM  OR DIAGNOSIS REQUIRED)    Answer:  Post chemo for breast cancer stage 4    Order Specific Question:  Is the patient pregnant?    Answer:  No    Order Specific Question:  Preferred imaging location?    Answer:  [facility]  . CBC with Differential    Standing Status: Future     Number of Occurrences:      Standing Expiration Date: 03/24/2015  . Comprehensive metabolic panel (Cmet) - [facility]    Standing Status: Future     Number of Occurrences:      Standing Expiration Date: 03/24/2015   The patient has a good understanding of the overall plan. she agrees with it. She will call with any problems that may develop before her next visit here.   Rulon Eisenmenger, MD 03/24/2014 8:26 AM

## 2014-03-24 NOTE — Telephone Encounter (Signed)
, °

## 2014-03-24 NOTE — Assessment & Plan Note (Addendum)
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 CT scans done of the chest after 3 cycles of chemotherapy revealed resolution of lung nodules. Clinically marked improvement in inflammatory breast cancer.once she completes chemotherapy, depending on the response, we may elect to radiate the ninth rib. She will then be restarted on antiestrogen therapy for maintenance.  Toxicities of chemotherapy 1. Chemotherapy-induced fatigue 2. Alopecia 3. Chemotherapy-induced leukopenia and neutropenia 4. Chemotherapy-induced anemia  Monitoring closely for toxicities Return to clinic in 3 weeks for cycle 6. After the conclusion of cycle 6, we will plan on doing a PET CT scan and a breast MRI and present for the breast tumor board to make further treatment decisions.

## 2014-03-24 NOTE — Addendum Note (Signed)
Addended by: Prentiss Bells on: 03/24/2014 08:33 AM   Modules accepted: Medications

## 2014-03-25 ENCOUNTER — Ambulatory Visit (HOSPITAL_BASED_OUTPATIENT_CLINIC_OR_DEPARTMENT_OTHER): Payer: 59

## 2014-03-25 DIAGNOSIS — Z5189 Encounter for other specified aftercare: Secondary | ICD-10-CM

## 2014-03-25 DIAGNOSIS — C50811 Malignant neoplasm of overlapping sites of right female breast: Secondary | ICD-10-CM

## 2014-03-25 MED ORDER — PEGFILGRASTIM INJECTION 6 MG/0.6ML ~~LOC~~
6.0000 mg | PREFILLED_SYRINGE | Freq: Once | SUBCUTANEOUS | Status: AC
  Administered 2014-03-25: 6 mg via SUBCUTANEOUS

## 2014-04-14 ENCOUNTER — Other Ambulatory Visit: Payer: 59

## 2014-04-14 ENCOUNTER — Other Ambulatory Visit (HOSPITAL_BASED_OUTPATIENT_CLINIC_OR_DEPARTMENT_OTHER): Payer: 59

## 2014-04-14 ENCOUNTER — Telehealth: Payer: Self-pay | Admitting: Hematology and Oncology

## 2014-04-14 ENCOUNTER — Ambulatory Visit (HOSPITAL_BASED_OUTPATIENT_CLINIC_OR_DEPARTMENT_OTHER): Payer: 59 | Admitting: Hematology and Oncology

## 2014-04-14 ENCOUNTER — Ambulatory Visit (HOSPITAL_BASED_OUTPATIENT_CLINIC_OR_DEPARTMENT_OTHER): Payer: 59

## 2014-04-14 VITALS — BP 119/69 | HR 88 | Temp 98.9°F | Resp 18 | Ht 61.0 in | Wt 198.1 lb

## 2014-04-14 DIAGNOSIS — C50811 Malignant neoplasm of overlapping sites of right female breast: Secondary | ICD-10-CM

## 2014-04-14 DIAGNOSIS — C50511 Malignant neoplasm of 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: Secondary | ICD-10-CM

## 2014-04-14 DIAGNOSIS — C773 Secondary and unspecified malignant neoplasm of axilla and upper limb lymph nodes: Secondary | ICD-10-CM

## 2014-04-14 DIAGNOSIS — D6481 Anemia due to antineoplastic chemotherapy: Secondary | ICD-10-CM

## 2014-04-14 DIAGNOSIS — C7801 Secondary malignant neoplasm of right lung: Secondary | ICD-10-CM

## 2014-04-14 DIAGNOSIS — C781 Secondary malignant neoplasm of mediastinum: Secondary | ICD-10-CM

## 2014-04-14 DIAGNOSIS — D701 Agranulocytosis secondary to cancer chemotherapy: Secondary | ICD-10-CM

## 2014-04-14 DIAGNOSIS — C7951 Secondary malignant neoplasm of bone: Secondary | ICD-10-CM

## 2014-04-14 DIAGNOSIS — C7802 Secondary malignant neoplasm of left lung: Secondary | ICD-10-CM

## 2014-04-14 DIAGNOSIS — Z5111 Encounter for antineoplastic chemotherapy: Secondary | ICD-10-CM

## 2014-04-14 LAB — CBC WITH DIFFERENTIAL/PLATELET
BASO%: 0.8 % (ref 0.0–2.0)
Basophils Absolute: 0.1 10*3/uL (ref 0.0–0.1)
EOS%: 0 % (ref 0.0–7.0)
Eosinophils Absolute: 0 10*3/uL (ref 0.0–0.5)
HCT: 29.9 % — ABNORMAL LOW (ref 34.8–46.6)
HGB: 9.5 g/dL — ABNORMAL LOW (ref 11.6–15.9)
LYMPH%: 10.3 % — AB (ref 14.0–49.7)
MCH: 28.3 pg (ref 25.1–34.0)
MCHC: 31.8 g/dL (ref 31.5–36.0)
MCV: 89.2 fL (ref 79.5–101.0)
MONO#: 1 10*3/uL — AB (ref 0.1–0.9)
MONO%: 11 % (ref 0.0–14.0)
NEUT%: 77.9 % — ABNORMAL HIGH (ref 38.4–76.8)
NEUTROS ABS: 6.8 10*3/uL — AB (ref 1.5–6.5)
PLATELETS: 358 10*3/uL (ref 145–400)
RBC: 3.35 10*6/uL — ABNORMAL LOW (ref 3.70–5.45)
RDW: 22.3 % — AB (ref 11.2–14.5)
WBC: 8.8 10*3/uL (ref 3.9–10.3)
lymph#: 0.9 10*3/uL (ref 0.9–3.3)

## 2014-04-14 LAB — COMPREHENSIVE METABOLIC PANEL (CC13)
ALT: 7 U/L (ref 0–55)
AST: 11 U/L (ref 5–34)
Albumin: 3.5 g/dL (ref 3.5–5.0)
Alkaline Phosphatase: 68 U/L (ref 40–150)
Anion Gap: 6 mEq/L (ref 3–11)
BUN: 15.4 mg/dL (ref 7.0–26.0)
CALCIUM: 8.9 mg/dL (ref 8.4–10.4)
CO2: 21 mEq/L — ABNORMAL LOW (ref 22–29)
CREATININE: 0.8 mg/dL (ref 0.6–1.1)
Chloride: 114 mEq/L — ABNORMAL HIGH (ref 98–109)
Glucose: 106 mg/dl (ref 70–140)
Potassium: 4 mEq/L (ref 3.5–5.1)
Sodium: 141 mEq/L (ref 136–145)
Total Bilirubin: 0.36 mg/dL (ref 0.20–1.20)
Total Protein: 6.7 g/dL (ref 6.4–8.3)

## 2014-04-14 MED ORDER — DOCETAXEL CHEMO INJECTION 160 MG/16ML
60.0000 mg/m2 | Freq: Once | INTRAVENOUS | Status: AC
  Administered 2014-04-14: 120 mg via INTRAVENOUS
  Filled 2014-04-14: qty 12

## 2014-04-14 MED ORDER — DEXAMETHASONE SODIUM PHOSPHATE 20 MG/5ML IJ SOLN
INTRAMUSCULAR | Status: AC
  Filled 2014-04-14: qty 5

## 2014-04-14 MED ORDER — HEPARIN SOD (PORK) LOCK FLUSH 100 UNIT/ML IV SOLN
500.0000 [IU] | Freq: Once | INTRAVENOUS | Status: AC | PRN
  Administered 2014-04-14: 500 [IU]
  Filled 2014-04-14: qty 5

## 2014-04-14 MED ORDER — SODIUM CHLORIDE 0.9 % IJ SOLN
10.0000 mL | INTRAMUSCULAR | Status: DC | PRN
  Administered 2014-04-14: 10 mL
  Filled 2014-04-14: qty 10

## 2014-04-14 MED ORDER — ONDANSETRON 16 MG/50ML IVPB (CHCC)
INTRAVENOUS | Status: AC
  Filled 2014-04-14: qty 16

## 2014-04-14 MED ORDER — SODIUM CHLORIDE 0.9 % IV SOLN
Freq: Once | INTRAVENOUS | Status: AC
  Administered 2014-04-14: 11:00:00 via INTRAVENOUS

## 2014-04-14 MED ORDER — DEXAMETHASONE SODIUM PHOSPHATE 20 MG/5ML IJ SOLN
20.0000 mg | Freq: Once | INTRAMUSCULAR | Status: AC
  Administered 2014-04-14: 20 mg via INTRAVENOUS

## 2014-04-14 MED ORDER — SODIUM CHLORIDE 0.9 % IV SOLN
600.0000 mg/m2 | Freq: Once | INTRAVENOUS | Status: AC
  Administered 2014-04-14: 1180 mg via INTRAVENOUS
  Filled 2014-04-14: qty 59

## 2014-04-14 MED ORDER — ONDANSETRON 16 MG/50ML IVPB (CHCC)
16.0000 mg | Freq: Once | INTRAVENOUS | Status: AC
  Administered 2014-04-14: 16 mg via INTRAVENOUS

## 2014-04-14 NOTE — Patient Instructions (Signed)
[facility] Discharge Instructions for Patients Receiving Chemotherapy  Today you received the following chemotherapy agents Taxotere and Cytoxan.  To help prevent nausea and vomiting after your treatment, we encourage you to take your nausea medication as prescribed.   If you develop nausea and vomiting that is not controlled by your nausea medication, call the clinic.   BELOW ARE SYMPTOMS THAT SHOULD BE REPORTED IMMEDIATELY:  *FEVER GREATER THAN 100.5 F  *CHILLS WITH OR WITHOUT FEVER  NAUSEA AND VOMITING THAT IS NOT CONTROLLED WITH YOUR NAUSEA MEDICATION  *UNUSUAL SHORTNESS OF BREATH  *UNUSUAL BRUISING OR BLEEDING  TENDERNESS IN MOUTH AND THROAT WITH OR WITHOUT PRESENCE OF ULCERS  *URINARY PROBLEMS  *BOWEL PROBLEMS  UNUSUAL RASH Items with * indicate a potential emergency and should be followed up as soon as possible.  Feel free to call the clinic you have any questions or concerns. The clinic phone number is [phone].    

## 2014-04-14 NOTE — Progress Notes (Signed)
Patient Care Team: Iona Beard, MD as PCP - General (Family Medicine) Autumn Messing III, MD as Consulting Physician (General Surgery) Rulon Eisenmenger, MD as Consulting Physician (Hematology and Oncology) Rexene Edison, MD as Consulting Physician (Radiation Oncology)  DIAGNOSIS: Breast cancer of 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   Staging form: Breast, AJCC 7th Edition     Clinical: Stage IV (T4b, N1, M1) - Signed by Rulon Eisenmenger, MD on 12/30/2013     Pathologic: No stage assigned - Unsigned   SUMMARY OF ONCOLOGIC HISTORY:   Breast cancer of 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   12/01/2013 Initial Diagnosis Breast cancer of 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   12/06/2013 Breast MRI Enhancing tumor throughout the right breast all quadrants with diffuse skin thickening 11.5 x 11 x 5 cm: Spiculated mass posterior UOQ right breast 2.6 x 2.5 x 2 cm and abuts pectoralis muscle: 2 abnormal axillary lymph nodes largest 2.2 cm   12/20/2013 PET scan Stage IV disease noted with 1. Diffuse hypermetabolic right breast inflammatory carcinoma.  Metastatic lymphadenopathy in the right axilla, mediastinum, and bilateral hilar regions, small bilateral pulmonary metastases, possible 9th rib metastases   12/30/2013 -  Chemotherapy Taxotere and cytoxan given on day 1 of a 21 day cycle with neulasta given on day 2 for granulocyte support.  A total of 6 cycles are planned with CT chest and mammo/ultrasound planned after cycle 3.     CHIEF COMPLIANT: Taxotere Cytoxan cycle 6 today, last cycle of chemotherapy  INTERVAL HISTORY: Karen Douglas is a 59 year old lady with above-mentioned history of right-sided breast cancer with small lung nodules, with metastatic involvement of hilum mediastinum and ninth rib lesion suspicious for metastatic disease. She has received 6 cycles of Taxotere and Cytoxan after today's dosage. We will be obtaining a PET CT scan and a breast MRI to evaluate the response and  to determine whether or definitive treatment with surgery and our radiation may be appropriate in her case. She has done well with chemotherapy with moderate toxicities. She denies any fevers or chills or cough or expectoration. She denies any nausea or vomiting.  REVIEW OF SYSTEMS:   Constitutional: Denies fevers, chills or abnormal weight loss, alopecia Eyes: Denies blurriness of vision Ears, nose, mouth, throat, and face: Denies mucositis or sore throat Respiratory: Denies cough, dyspnea or wheezes Cardiovascular: Denies palpitation, chest discomfort or lower extremity swelling Gastrointestinal:  Denies nausea, heartburn or change in bowel habits Skin: Denies abnormal skin rashes Lymphatics: Denies new lymphadenopathy or easy bruising Neurological:Denies numbness, tingling or new weaknesses Behavioral/Psych: Mood is stable, no new changes  All other systems were reviewed with the patient and are negative.  I have reviewed the past medical history, past surgical history, social history and family history with the patient and they are unchanged from previous note.  ALLERGIES:  has No Known Allergies.  MEDICATIONS:  Current Outpatient Prescriptions  Medication Sig Dispense Refill  . atorvastatin (LIPITOR) 20 MG tablet Take 20 mg by mouth daily.    . calcium carbonate (OS-CAL) 600 MG TABS tablet Take 600 mg by mouth daily with breakfast.    . dexamethasone (DECADRON) 4 MG tablet Take 2 tablets (8 mg total) by mouth 2 (two) times daily. Start the day before Taxotere. Take once the day after, then 2 times a day x 2d. 30 tablet 1  . lidocaine-prilocaine (EMLA) cream Apply 1 application topically as needed. 30 g 0  . LORazepam (ATIVAN) 0.5 MG tablet  Take 1 tablet (0.5 mg total) by mouth every 6 (six) hours as needed (Nausea or vomiting). 30 tablet 0  . ondansetron (ZOFRAN) 8 MG tablet Take 1 tablet (8 mg total) by mouth 2 (two) times daily as needed (Nausea or vomiting). Begin 4 days after  chemotherapy. 30 tablet 1  . oxyCODONE-acetaminophen (PERCOCET/ROXICET) 5-325 MG per tablet Take 1 tablet by mouth every 4 (four) hours as needed for severe pain. 30 tablet 0  . prochlorperazine (COMPAZINE) 10 MG tablet Take 1 tablet (10 mg total) by mouth every 6 (six) hours as needed (Nausea or vomiting). 30 tablet 1   No current facility-administered medications for this visit.    PHYSICAL EXAMINATION: ECOG PERFORMANCE STATUS: 1 - Symptomatic but completely ambulatory  Filed Vitals:   04/14/14 1011  BP: 119/69  Pulse: 88  Temp: 98.9 F (37.2 C)  Resp: 18   Filed Weights   04/14/14 1011  Weight: 198 lb 1.6 oz (89.858 kg)    GENERAL:alert, no distress and comfortable SKIN: skin color, texture, turgor are normal, no rashes or significant lesions EYES: normal, Conjunctiva are pink and non-injected, sclera clear OROPHARYNX:no exudate, no erythema and lips, buccal mucosa, and tongue normal  NECK: supple, thyroid normal size, non-tender, without nodularity LYMPH:  no palpable lymphadenopathy in the cervical, axillary or inguinal LUNGS: clear to auscultation and percussion with normal breathing effort HEART: regular rate & rhythm and no murmurs and no lower extremity edema ABDOMEN:abdomen soft, non-tender and normal bowel sounds Musculoskeletal:no cyanosis of digits and no clubbing  NEURO: alert & oriented x 3 with fluent speech, no focal motor/sensory deficits  LABORATORY DATA:  I have reviewed the data as listed   Chemistry      Component Value Date/Time   NA 141 04/14/2014 0949   K 4.0 04/14/2014 0949   CO2 21* 04/14/2014 0949   BUN 15.4 04/14/2014 0949   CREATININE 0.8 04/14/2014 0949      Component Value Date/Time   CALCIUM 8.9 04/14/2014 0949   ALKPHOS 68 04/14/2014 0949   AST 11 04/14/2014 0949   ALT 7 04/14/2014 0949   BILITOT 0.36 04/14/2014 0949       Lab Results  Component Value Date   WBC 8.8 04/14/2014   HGB 9.5* 04/14/2014   HCT 29.9* 04/14/2014    MCV 89.2 04/14/2014   PLT 358 04/14/2014   NEUTROABS 6.8* 04/14/2014   ASSESSMENT & PLAN:  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 CT scans done of the chest after 3 cycles of chemotherapy revealed resolution of lung nodules. Clinically marked improvement in inflammatory breast cancer.  Plan: PET CT scan and breast MRI as scheduled for January 20 and we will present her case in the tumor board, depending on the response, we may elect to radiate the ninth rib. After surgery and radiation, she will then be started on antiestrogen therapy for maintenance.  Toxicities of chemotherapy 1. Chemotherapy-induced fatigue 2. Alopecia 3. Chemotherapy-induced leukopenia  and neutropenia 4. Chemotherapy-induced anemia  Monitoring closely for toxicities Return to clinic January 28 to discuss multidisciplinary treatment plan   Orders Placed This Encounter  Procedures  . CBC with Differential    Standing Status: Future     Number of Occurrences:      Standing Expiration Date: 04/14/2015  . Comprehensive metabolic panel (Cmet) - [facility]    Standing Status: Future     Number of Occurrences:      Standing Expiration Date: 04/14/2015   The patient has a good understanding of the overall plan. she agrees with it. She will call with any problems that may develop before her next visit here.   Rulon Eisenmenger, MD 04/14/2014 10:34  AM

## 2014-04-14 NOTE — Progress Notes (Signed)
Due to insurance issue, pt's neulasta appt.moved to 1315hr tomorrow. Pt. Aware. HL

## 2014-04-14 NOTE — Assessment & Plan Note (Signed)
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 CT scans done of the chest after 3 cycles of chemotherapy revealed resolution of lung nodules. Clinically marked improvement in inflammatory breast cancer.  Plan: PET CT scan and breast MRI as scheduled for January 20 and we will present her case in the tumor board, depending on the response, we may elect to radiate the ninth rib. After surgery and radiation, she will then be started on antiestrogen therapy for maintenance.  Toxicities of chemotherapy 1. Chemotherapy-induced fatigue 2. Alopecia 3. Chemotherapy-induced leukopenia and neutropenia 4. Chemotherapy-induced anemia  Monitoring closely for toxicities Return to clinic January 28 to discuss multidisciplinary treatment plan

## 2014-04-14 NOTE — Telephone Encounter (Signed)
, °

## 2014-04-15 ENCOUNTER — Ambulatory Visit (HOSPITAL_BASED_OUTPATIENT_CLINIC_OR_DEPARTMENT_OTHER): Payer: 59

## 2014-04-15 DIAGNOSIS — C50511 Malignant neoplasm of 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: Secondary | ICD-10-CM

## 2014-04-15 DIAGNOSIS — Z5189 Encounter for other specified aftercare: Secondary | ICD-10-CM

## 2014-04-15 MED ORDER — PEGFILGRASTIM INJECTION 6 MG/0.6ML ~~LOC~~
6.0000 mg | PREFILLED_SYRINGE | Freq: Once | SUBCUTANEOUS | Status: AC
  Administered 2014-04-15: 6 mg via SUBCUTANEOUS

## 2014-04-15 NOTE — Patient Instructions (Signed)
Pegfilgrastim injection What is this medicine? PEGFILGRASTIM (peg fil GRA stim) is a long-acting granulocyte colony-stimulating factor that stimulates the growth of neutrophils, a type of white blood cell important in the body's fight against infection. It is used to reduce the incidence of fever and infection in patients with certain types of cancer who are receiving chemotherapy that affects the bone marrow. This medicine may be used for other purposes; ask your health care provider or pharmacist if you have questions. COMMON BRAND NAME(S): Neulasta What should I tell my health care provider before I take this medicine? They need to know if you have any of these conditions: -latex allergy -ongoing radiation therapy -sickle cell disease -skin reactions to acrylic adhesives (On-Body Injector only) -an unusual or allergic reaction to pegfilgrastim, filgrastim, other medicines, foods, dyes, or preservatives -pregnant or trying to get pregnant -breast-feeding How should I use this medicine? This medicine is for injection under the skin. If you get this medicine at home, you will be taught how to prepare and give the pre-filled syringe or how to use the On-body Injector. Refer to the patient Instructions for Use for detailed instructions. Use exactly as directed. Take your medicine at regular intervals. Do not take your medicine more often than directed. It is important that you put your used needles and syringes in a special sharps container. Do not put them in a trash can. If you do not have a sharps container, call your pharmacist or healthcare provider to get one. Talk to your pediatrician regarding the use of this medicine in children. Special care may be needed. Overdosage: If you think you have taken too much of this medicine contact a poison control center or emergency room at once. NOTE: This medicine is only for you. Do not share this medicine with others. What if I miss a dose? It is  important not to miss your dose. Call your doctor or health care professional if you miss your dose. If you miss a dose due to an On-body Injector failure or leakage, a new dose should be administered as soon as possible using a single prefilled syringe for manual use. What may interact with this medicine? Interactions have not been studied. Give your health care provider a list of all the medicines, herbs, non-prescription drugs, or dietary supplements you use. Also tell them if you smoke, drink alcohol, or use illegal drugs. Some items may interact with your medicine. This list may not describe all possible interactions. Give your health care provider a list of all the medicines, herbs, non-prescription drugs, or dietary supplements you use. Also tell them if you smoke, drink alcohol, or use illegal drugs. Some items may interact with your medicine. What should I watch for while using this medicine? You may need blood work done while you are taking this medicine. If you are going to need a MRI, CT scan, or other procedure, tell your doctor that you are using this medicine (On-Body Injector only). What side effects may I notice from receiving this medicine? Side effects that you should report to your doctor or health care professional as soon as possible: -allergic reactions like skin rash, itching or hives, swelling of the face, lips, or tongue -dizziness -fever -pain, redness, or irritation at site where injected -pinpoint red spots on the skin -shortness of breath or breathing problems -stomach or side pain, or pain at the shoulder -swelling -tiredness -trouble passing urine Side effects that usually do not require medical attention (report to your doctor   or health care professional if they continue or are bothersome): -bone pain -muscle pain This list may not describe all possible side effects. Call your doctor for medical advice about side effects. You may report side effects to FDA at  1-800-FDA-1088. Where should I keep my medicine? Keep out of the reach of children. Store pre-filled syringes in a refrigerator between 2 and 8 degrees C (36 and 46 degrees F). Do not freeze. Keep in carton to protect from light. Throw away this medicine if it is left out of the refrigerator for more than 48 hours. Throw away any unused medicine after the expiration date. NOTE: This sheet is a summary. It may not cover all possible information. If you have questions about this medicine, talk to your doctor, pharmacist, or health care provider.  2015, [employer]vier/[employer] Standard. (2013-06-23 16:14:05)  

## 2014-04-26 ENCOUNTER — Ambulatory Visit (HOSPITAL_COMMUNITY)
Admission: RE | Admit: 2014-04-26 | Discharge: 2014-04-26 | Disposition: A | Discharge status: Home / Self Care | Payer: 59 | Source: Ambulatory Visit | Attending: Hematology and Oncology | Admitting: Hematology and Oncology

## 2014-04-26 ENCOUNTER — Other Ambulatory Visit: Payer: Self-pay | Admitting: Hematology and Oncology

## 2014-04-26 DIAGNOSIS — C50511 Malignant neoplasm of 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: Secondary | ICD-10-CM

## 2014-04-26 DIAGNOSIS — Z5111 Encounter for antineoplastic chemotherapy: Secondary | ICD-10-CM | POA: Insufficient documentation

## 2014-04-26 LAB — GLUCOSE, CAPILLARY: GLUCOSE-CAPILLARY: 88 mg/dL (ref 70–99)

## 2014-04-26 MED ORDER — FLUDEOXYGLUCOSE F - 18 (FDG) INJECTION
12.0000 | Freq: Once | INTRAVENOUS | Status: AC | PRN
  Administered 2014-04-26: 12 via INTRAVENOUS

## 2014-04-26 MED ORDER — GADOBENATE DIMEGLUMINE 529 MG/ML IV SOLN
20.0000 mL | Freq: Once | INTRAVENOUS | Status: AC | PRN
  Administered 2014-04-26: 18 mL via INTRAVENOUS

## 2014-05-03 ENCOUNTER — Other Ambulatory Visit: Payer: Self-pay | Admitting: *Deleted

## 2014-05-03 ENCOUNTER — Telehealth: Payer: Self-pay | Admitting: Hematology and Oncology

## 2014-05-04 ENCOUNTER — Other Ambulatory Visit (HOSPITAL_BASED_OUTPATIENT_CLINIC_OR_DEPARTMENT_OTHER): Payer: 59

## 2014-05-04 ENCOUNTER — Ambulatory Visit (HOSPITAL_BASED_OUTPATIENT_CLINIC_OR_DEPARTMENT_OTHER): Payer: 59 | Admitting: Hematology and Oncology

## 2014-05-04 ENCOUNTER — Ambulatory Visit: Payer: 59 | Admitting: Hematology and Oncology

## 2014-05-04 ENCOUNTER — Other Ambulatory Visit: Payer: 59

## 2014-05-04 VITALS — BP 108/65 | HR 104 | Temp 97.5°F | Resp 18 | Ht 61.0 in | Wt 199.9 lb

## 2014-05-04 DIAGNOSIS — R918 Other nonspecific abnormal finding of lung field: Secondary | ICD-10-CM

## 2014-05-04 DIAGNOSIS — C50811 Malignant neoplasm of overlapping sites of right female breast: Secondary | ICD-10-CM

## 2014-05-04 DIAGNOSIS — C50511 Malignant neoplasm of 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: Secondary | ICD-10-CM

## 2014-05-04 DIAGNOSIS — C7951 Secondary malignant neoplasm of bone: Secondary | ICD-10-CM

## 2014-05-04 LAB — CBC WITH DIFFERENTIAL/PLATELET
BASO%: 0.4 % (ref 0.0–2.0)
BASOS ABS: 0 10*3/uL (ref 0.0–0.1)
EOS%: 0.1 % (ref 0.0–7.0)
Eosinophils Absolute: 0 10*3/uL (ref 0.0–0.5)
HEMATOCRIT: 28.6 % — AB (ref 34.8–46.6)
HGB: 9 g/dL — ABNORMAL LOW (ref 11.6–15.9)
LYMPH%: 23.4 % (ref 14.0–49.7)
MCH: 28.6 pg (ref 25.1–34.0)
MCHC: 31.5 g/dL (ref 31.5–36.0)
MCV: 90.8 fL (ref 79.5–101.0)
MONO#: 0.7 10*3/uL (ref 0.1–0.9)
MONO%: 17.1 % — AB (ref 0.0–14.0)
NEUT%: 59 % (ref 38.4–76.8)
NEUTROS ABS: 2.5 10*3/uL (ref 1.5–6.5)
Platelets: 286 10*3/uL (ref 145–400)
RBC: 3.15 10*6/uL — ABNORMAL LOW (ref 3.70–5.45)
RDW: 23.1 % — ABNORMAL HIGH (ref 11.2–14.5)
WBC: 4.3 10*3/uL (ref 3.9–10.3)
lymph#: 1 10*3/uL (ref 0.9–3.3)

## 2014-05-04 LAB — COMPREHENSIVE METABOLIC PANEL (CC13)
ALT: 6 U/L (ref 0–55)
AST: 11 U/L (ref 5–34)
Albumin: 3.2 g/dL — ABNORMAL LOW (ref 3.5–5.0)
Alkaline Phosphatase: 63 U/L (ref 40–150)
Anion Gap: 8 mEq/L (ref 3–11)
BUN: 8.8 mg/dL (ref 7.0–26.0)
CO2: 23 mEq/L (ref 22–29)
Calcium: 9 mg/dL (ref 8.4–10.4)
Chloride: 112 mEq/L — ABNORMAL HIGH (ref 98–109)
Creatinine: 0.8 mg/dL (ref 0.6–1.1)
EGFR: >90 mL/min/{1.73_m2} (ref >90)
Glucose: 107 mg/dl (ref 70–140)
Potassium: 4.2 mEq/L (ref 3.5–5.1)
Sodium: 143 mEq/L (ref 136–145)
Total Bilirubin: 0.41 mg/dL (ref 0.20–1.20)
Total Protein: 6.1 g/dL — ABNORMAL LOW (ref 6.4–8.3)

## 2014-05-04 NOTE — Progress Notes (Signed)
Patient Care Team: Iona Beard, MD as PCP - General (Family Medicine) Autumn Messing III, MD as Consulting Physician (General Surgery) Rulon Eisenmenger, MD as Consulting Physician (Hematology and Oncology) Rexene Edison, MD as Consulting Physician (Radiation Oncology)  DIAGNOSIS: Breast cancer of 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   Staging form: Breast, AJCC 7th Edition     Clinical: Stage IV (T4b, N1, M1) - Signed by Rulon Eisenmenger, MD on 12/30/2013     Pathologic: No stage assigned - Unsigned   SUMMARY OF ONCOLOGIC HISTORY:   Breast cancer of 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   12/01/2013 Initial Diagnosis Breast cancer of 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   12/06/2013 Breast MRI Enhancing tumor throughout the right breast all quadrants with diffuse skin thickening 11.5 x 11 x 5 cm: Spiculated mass posterior UOQ right breast 2.6 x 2.5 x 2 cm and abuts pectoralis muscle: 2 abnormal axillary lymph nodes largest 2.2 cm   12/20/2013 PET scan Stage IV disease noted with 1. Diffuse hypermetabolic right breast inflammatory carcinoma.  Metastatic lymphadenopathy in the right axilla, mediastinum, and bilateral hilar regions, small bilateral pulmonary metastases, possible 9th rib metastases   12/30/2013 - 04/14/2014 Chemotherapy Taxotere and cytoxan given on day 1 of a 21 day cycle with neulasta given on day 2 for granulocyte support.  A total of 6 cycles are planned with CT chest and mammo/ultrasound planned after cycle 3.    04/26/2014 Breast MRI Right breast interval decrease in tumor burden less than 1 cm small nodules involving all quadrants largest 0.7 cm spiculated mass which previously measured 11.5 cm is currently 1.8 cm persistent diffuse skin enhancement but much improved   04/26/2014 PET scan Marked response due to chemotherapy, resolution of right axillary lymph node, mild residual activity in the skin of the right breast, left ninth rib no activity noted, no lung nodules     CHIEF COMPLIANT: Follow-up to review the MRI breasts and PET/CT scan  INTERVAL HISTORY: Karen Douglas is a 59 year old lady with above-mentioned history of right-sided breast cancer which was inflammatory breast cancer with metastatic disease noted in the lungs, mediastinal and hilar nodes, axillary nodes, seventh rib. She underwent 6 cycles of Taxotere and Cytoxan and had a follow-up breast MRI in a head CT scan and we presented her case in multidisciplinary tumor board and she is here today to discuss the results. She is extremely tired but otherwise feeling well denies any further nausea vomiting and denies any diarrhea or constipation.  REVIEW OF SYSTEMS:   Constitutional: Denies fevers, chills or abnormal weight loss Eyes: Denies blurriness of vision Ears, nose, mouth, throat, and face: Denies mucositis or sore throat Respiratory: Denies cough, dyspnea or wheezes Cardiovascular: Denies palpitation, chest discomfort or lower extremity swelling Gastrointestinal:  Denies nausea, heartburn or change in bowel habits Skin: Denies abnormal skin rashes Lymphatics: Denies new lymphadenopathy or easy bruising Neurological:Denies numbness, tingling or new weaknesses Behavioral/Psych: Mood is stable, no new changes  Breast:  Breasts feels much softer and less tender All other systems were reviewed with the patient and are negative.  I have reviewed the past medical history, past surgical history, social history and family history with the patient and they are unchanged from previous note.  ALLERGIES:  has No Known Allergies.  MEDICATIONS:  Current Outpatient Prescriptions  Medication Sig Dispense Refill  . atorvastatin (LIPITOR) 20 MG tablet Take 20 mg by mouth daily.    . calcium carbonate (OS-CAL) 600 MG  TABS tablet Take 600 mg by mouth daily with breakfast.    . dexamethasone (DECADRON) 4 MG tablet Take 2 tablets (8 mg total) by mouth 2 (two) times daily. Start the day before Taxotere.  Take once the day after, then 2 times a day x 2d. 30 tablet 1  . lidocaine-prilocaine (EMLA) cream Apply 1 application topically as needed. 30 g 0  . LORazepam (ATIVAN) 0.5 MG tablet Take 1 tablet (0.5 mg total) by mouth every 6 (six) hours as needed (Nausea or vomiting). 30 tablet 0  . ondansetron (ZOFRAN) 8 MG tablet Take 1 tablet (8 mg total) by mouth 2 (two) times daily as needed (Nausea or vomiting). Begin 4 days after chemotherapy. 30 tablet 1  . oxyCODONE-acetaminophen (PERCOCET/ROXICET) 5-325 MG per tablet Take 1 tablet by mouth every 4 (four) hours as needed for severe pain. 30 tablet 0  . prochlorperazine (COMPAZINE) 10 MG tablet Take 1 tablet (10 mg total) by mouth every 6 (six) hours as needed (Nausea or vomiting). 30 tablet 1   No current facility-administered medications for this visit.    PHYSICAL EXAMINATION: ECOG PERFORMANCE STATUS: 1 - Symptomatic but completely ambulatory  Filed Vitals:   05/04/14 1344  BP: 108/65  Pulse: 104  Temp: 97.5 F (36.4 C)  Resp: 18   Filed Weights   05/04/14 1344  Weight: 199 lb 14.4 oz (90.674 kg)    GENERAL:alert, no distress and comfortable SKIN: skin color, texture, turgor are normal, no rashes or significant lesions EYES: normal, Conjunctiva are pink and non-injected, sclera clear OROPHARYNX:no exudate, no erythema and lips, buccal mucosa, and tongue normal  NECK: supple, thyroid normal size, non-tender, without nodularity LYMPH:  no palpable lymphadenopathy in the cervical, axillary or inguinal LUNGS: clear to auscultation and percussion with normal breathing effort HEART: regular rate & rhythm and no murmurs and no lower extremity edema ABDOMEN:abdomen soft, non-tender and normal bowel sounds Musculoskeletal:no cyanosis of digits and no clubbing  NEURO: alert & oriented x 3 with fluent speech, no focal motor/sensory deficits  LABORATORY DATA:  I have reviewed the data as listed   Chemistry      Component Value Date/Time    NA 143 05/04/2014 1337   K 4.2 05/04/2014 1337   CO2 23 05/04/2014 1337   BUN 8.8 05/04/2014 1337   CREATININE 0.8 05/04/2014 1337      Component Value Date/Time   CALCIUM 9.0 05/04/2014 1337   ALKPHOS 63 05/04/2014 1337   AST 11 05/04/2014 1337   ALT 6 05/04/2014 1337   BILITOT 0.41 05/04/2014 1337       Lab Results  Component Value Date   WBC 4.3 05/04/2014   HGB 9.0* 05/04/2014   HCT 28.6* 05/04/2014   MCV 90.8 05/04/2014   PLT 286 05/04/2014   NEUTROABS 2.5 05/04/2014     RADIOGRAPHIC STUDIES: I have personally reviewed the radiology reports and agreed with their findings. 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  Post neoadjuvant scans: MRI of the breast  shows remarkable shrinkage of tumor from 11.5 cm down to 1.8 cm, few other foci of cancer up to 1 cm; PET/CT scan shows no activity in the rib and resolution of mediastinal and hilar lymphadenopathy as well as lung nodules.  Tumor board recommendation: 1. Mastectomy followed by radiation to chest wall and axilla 2. Did not recommend radiation to the rib at this time. 3. After radiation she will go on antiestrogen therapy.  Return to clinic after surgery to discuss final pathology report.      No orders of the defined types were placed in this encounter.   The patient has a good understanding of the overall plan. she agrees with it. She will call with any problems that may develop before her next visit  here.   Rulon Eisenmenger, MD

## 2014-05-04 NOTE — Assessment & Plan Note (Signed)
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  Post neoadjuvant scans: MRI of the breast shows remarkable shrinkage of tumor from 11.5 cm down to 1.8 cm, few other foci of cancer up to 1 cm; PET/CT scan shows no activity in the rib and resolution of mediastinal and hilar lymphadenopathy as well as lung nodules.  Tumor board recommendation: 1. Mastectomy followed by radiation to chest wall and axilla 2. Did not recommend radiation to the rib at this time. 3. After radiation she will go on antiestrogen therapy.  Return to clinic after surgery to discuss final pathology report.

## 2014-05-05 ENCOUNTER — Other Ambulatory Visit (INDEPENDENT_AMBULATORY_CARE_PROVIDER_SITE_OTHER): Payer: Self-pay | Admitting: General Surgery

## 2014-05-08 ENCOUNTER — Encounter: Payer: Self-pay | Admitting: Hematology and Oncology

## 2014-05-08 NOTE — Progress Notes (Signed)
Insurance is paying [id number] [phone]

## 2014-05-10 NOTE — Patient Instructions (Addendum)
Your procedure is scheduled on:  05/15/14  MONDAY  Report to [facility] at  [place]     AM.   Call this number if you have problems the morning of surgery: [phone]        Do not eat food  Or drink :After Midnight. Sunday NIGHT   Take these medicines the morning of surgery with A SIP OF WATER: no regular meds    .  Contacts, dentures or partial plates, or metal hairpins  can not be worn to surgery. Your family will be responsible for glasses, dentures, hearing aides while you are in surgery  Leave suitcase in the car. After surgery it may be brought to your room.  For patients admitted to the hospital, checkout time is 11:00 AM day of  discharge.         [facility] IS NOT RESPONSIBLE FOR ANY VALUABLES  Patients discharged the day of surgery will not be allowed to drive home. IF going home the day of surgery, you must have a driver and someone to stay with you for the first 24 hours                                                                  [facility] - Preparing for Surgery Before surgery, you can play an important role.  Because skin is not sterile, your skin needs to be as free of germs as possible.  You can reduce the number of germs on your skin by washing with CHG (chlorahexidine gluconate) soap before surgery.  CHG is an antiseptic cleaner which kills germs and bonds with the skin to continue killing germs even after washing. Please DO NOT use if you have an allergy to CHG or antibacterial soaps.  If your skin becomes reddened/irritated stop using the CHG and inform your nurse when you arrive at Short Stay. Do not shave (including legs and underarms) for at least 48 hours prior to the first CHG shower.  You may shave your face/neck. Please follow these instructions carefully:  1.  Shower with CHG Soap the night before surgery and the  morning of Surgery.  2.  If you choose to wash your hair,  wash your hair first as usual with your  normal  shampoo.  3.  After you shampoo, rinse your hair and body thoroughly to remove the  shampoo.                           4.  Use CHG as you would any other liquid soap.  You can apply chg directly  to the skin and wash                       Gently with a scrungie or clean washcloth.  5.  Apply the CHG Soap to your body ONLY FROM THE NECK DOWN.   Do not use on face/ open                           Wound or open sores. Avoid contact with eyes, ears mouth  and genitals (private parts).                       Wash face,  Genitals (private parts) with your normal soap.             6.  Wash thoroughly, paying special attention to the area where your surgery  will be performed.  7.  Thoroughly rinse your body with warm water from the neck down.  8.  DO NOT shower/wash with your normal soap after using and rinsing off  the CHG Soap.                9.  Pat yourself dry with a clean towel.            10.  Wear clean pajamas.            11.  Place clean sheets on your bed the night of your first shower and do not  sleep with pets. Day of Surgery : Do not apply any lotions/deodorants the morning of surgery.  Please wear clean clothes to the hospital/surgery center.  FAILURE TO FOLLOW THESE INSTRUCTIONS MAY RESULT IN THE CANCELLATION OF YOUR SURGERY PATIENT SIGNATURE_________________________________  NURSE SIGNATURE__________________________________

## 2014-05-10 NOTE — Progress Notes (Signed)
LOV Dr Lindi Adie 1/16, cbc and cmp 05/04/14, ct chest 11/15, eccho 9/15  epic

## 2014-05-11 ENCOUNTER — Other Ambulatory Visit: Payer: Self-pay | Admitting: *Deleted

## 2014-05-11 DIAGNOSIS — C50511 Malignant neoplasm of 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: Secondary | ICD-10-CM

## 2014-05-12 ENCOUNTER — Other Ambulatory Visit (HOSPITAL_BASED_OUTPATIENT_CLINIC_OR_DEPARTMENT_OTHER): Payer: 59

## 2014-05-12 ENCOUNTER — Ambulatory Visit (HOSPITAL_BASED_OUTPATIENT_CLINIC_OR_DEPARTMENT_OTHER): Payer: 59

## 2014-05-12 ENCOUNTER — Encounter (HOSPITAL_COMMUNITY): Payer: Self-pay

## 2014-05-12 ENCOUNTER — Encounter (HOSPITAL_COMMUNITY)
Admission: RE | Admit: 2014-05-12 | Discharge: 2014-05-12 | Disposition: A | Discharge status: Home / Self Care | Payer: 59 | Source: Ambulatory Visit | Attending: General Surgery | Admitting: General Surgery

## 2014-05-12 DIAGNOSIS — C50211 Malignant neoplasm of upper-inner quadrant of right female breast: Secondary | ICD-10-CM | POA: Diagnosis not present

## 2014-05-12 DIAGNOSIS — C50811 Malignant neoplasm of overlapping sites of right female breast: Secondary | ICD-10-CM

## 2014-05-12 DIAGNOSIS — C50411 Malignant neoplasm of upper-outer quadrant of right female breast: Secondary | ICD-10-CM | POA: Diagnosis not present

## 2014-05-12 DIAGNOSIS — C50911 Malignant neoplasm of unspecified site of right female breast: Secondary | ICD-10-CM | POA: Diagnosis present

## 2014-05-12 DIAGNOSIS — Z170 Estrogen receptor positive status [ER+]: Secondary | ICD-10-CM | POA: Diagnosis not present

## 2014-05-12 DIAGNOSIS — Z6837 Body mass index (BMI) 37.0-37.9, adult: Secondary | ICD-10-CM | POA: Diagnosis not present

## 2014-05-12 DIAGNOSIS — C50311 Malignant neoplasm of lower-inner quadrant of right female breast: Secondary | ICD-10-CM | POA: Diagnosis not present

## 2014-05-12 DIAGNOSIS — D0511 Intraductal carcinoma in situ of right breast: Secondary | ICD-10-CM | POA: Diagnosis not present

## 2014-05-12 DIAGNOSIS — Z79899 Other long term (current) drug therapy: Secondary | ICD-10-CM | POA: Diagnosis not present

## 2014-05-12 DIAGNOSIS — C50511 Malignant neoplasm of 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: Secondary | ICD-10-CM

## 2014-05-12 DIAGNOSIS — C7951 Secondary malignant neoplasm of bone: Secondary | ICD-10-CM

## 2014-05-12 DIAGNOSIS — E669 Obesity, unspecified: Secondary | ICD-10-CM | POA: Diagnosis not present

## 2014-05-12 DIAGNOSIS — Z7952 Long term (current) use of systemic steroids: Secondary | ICD-10-CM | POA: Diagnosis not present

## 2014-05-12 DIAGNOSIS — C773 Secondary and unspecified malignant neoplasm of axilla and upper limb lymph nodes: Secondary | ICD-10-CM | POA: Diagnosis not present

## 2014-05-12 LAB — COMPREHENSIVE METABOLIC PANEL (CC13)
ALBUMIN: 3.3 g/dL — AB (ref 3.5–5.0)
ALT: <6 U/L (ref 0–55)
AST: 11 U/L (ref 5–34)
Alkaline Phosphatase: 63 U/L (ref 40–150)
Anion Gap: 7 mEq/L (ref 3–11)
BUN: 11 mg/dL (ref 7.0–26.0)
CALCIUM: 9 mg/dL (ref 8.4–10.4)
CHLORIDE: 112 meq/L — AB (ref 98–109)
CO2: 24 mEq/L (ref 22–29)
Creatinine: 0.8 mg/dL (ref 0.6–1.1)
Glucose: 99 mg/dl (ref 70–140)
POTASSIUM: 4.1 meq/L (ref 3.5–5.1)
Sodium: 143 mEq/L (ref 136–145)
Total Bilirubin: 0.47 mg/dL (ref 0.20–1.20)
Total Protein: 6.4 g/dL (ref 6.4–8.3)

## 2014-05-12 LAB — CBC WITH DIFFERENTIAL/PLATELET
BASO%: 0.3 % (ref 0.0–2.0)
Basophils Absolute: 0 10*3/uL (ref 0.0–0.1)
EOS ABS: 0.1 10*3/uL (ref 0.0–0.5)
EOS%: 1.3 % (ref 0.0–7.0)
HEMATOCRIT: 31.1 % — AB (ref 34.8–46.6)
HGB: 9.8 g/dL — ABNORMAL LOW (ref 11.6–15.9)
LYMPH#: 1.1 10*3/uL (ref 0.9–3.3)
LYMPH%: 28.7 % (ref 14.0–49.7)
MCH: 28.7 pg (ref 25.1–34.0)
MCHC: 31.5 g/dL (ref 31.5–36.0)
MCV: 90.9 fL (ref 79.5–101.0)
MONO#: 0.6 10*3/uL (ref 0.1–0.9)
MONO%: 16.1 % — ABNORMAL HIGH (ref 0.0–14.0)
NEUT#: 2 10*3/uL (ref 1.5–6.5)
NEUT%: 53.6 % (ref 38.4–76.8)
PLATELETS: 313 10*3/uL (ref 145–400)
RBC: 3.42 10*6/uL — ABNORMAL LOW (ref 3.70–5.45)
RDW: 20.9 % — ABNORMAL HIGH (ref 11.2–14.5)
WBC: 3.8 10*3/uL — AB (ref 3.9–10.3)

## 2014-05-12 MED ORDER — SODIUM CHLORIDE 0.9 % IJ SOLN
10.0000 mL | INTRAMUSCULAR | Status: DC | PRN
  Administered 2014-05-12: 10 mL
  Filled 2014-05-12: qty 10

## 2014-05-12 MED ORDER — SODIUM CHLORIDE 0.9 % IV SOLN
Freq: Once | INTRAVENOUS | Status: AC
  Administered 2014-05-12: 10:00:00 via INTRAVENOUS

## 2014-05-12 MED ORDER — HEPARIN SOD (PORK) LOCK FLUSH 100 UNIT/ML IV SOLN
500.0000 [IU] | Freq: Once | INTRAVENOUS | Status: AC | PRN
  Administered 2014-05-12: 500 [IU]
  Filled 2014-05-12: qty 5

## 2014-05-12 MED ORDER — ZOLEDRONIC ACID 4 MG/100ML IV SOLN
4.0000 mg | Freq: Once | INTRAVENOUS | Status: AC
  Administered 2014-05-12: 4 mg via INTRAVENOUS
  Filled 2014-05-12: qty 100

## 2014-05-12 NOTE — Patient Instructions (Signed)

## 2014-05-12 NOTE — Progress Notes (Signed)
Cbc, cmp 05/12/14  epic

## 2014-05-15 ENCOUNTER — Ambulatory Visit (HOSPITAL_COMMUNITY): Payer: 59 | Admitting: Anesthesiology

## 2014-05-15 ENCOUNTER — Ambulatory Visit (HOSPITAL_COMMUNITY)
Admission: RE | Admit: 2014-05-15 | Discharge: 2014-05-17 | Disposition: A | Discharge status: Home / Self Care | Payer: 59 | Source: Ambulatory Visit | Attending: General Surgery | Admitting: General Surgery

## 2014-05-15 ENCOUNTER — Encounter (HOSPITAL_COMMUNITY): Payer: Self-pay | Admitting: *Deleted

## 2014-05-15 ENCOUNTER — Other Ambulatory Visit: Payer: Self-pay | Admitting: Nurse Practitioner

## 2014-05-15 ENCOUNTER — Encounter (HOSPITAL_COMMUNITY)
Admission: RE | Disposition: A | Discharge status: Home / Self Care | Payer: Self-pay | Source: Ambulatory Visit | Attending: General Surgery

## 2014-05-15 DIAGNOSIS — C50311 Malignant neoplasm of lower-inner quadrant of right female breast: Secondary | ICD-10-CM | POA: Insufficient documentation

## 2014-05-15 DIAGNOSIS — C50211 Malignant neoplasm of upper-inner quadrant of right female breast: Secondary | ICD-10-CM | POA: Insufficient documentation

## 2014-05-15 DIAGNOSIS — Z6837 Body mass index (BMI) 37.0-37.9, adult: Secondary | ICD-10-CM | POA: Insufficient documentation

## 2014-05-15 DIAGNOSIS — C773 Secondary and unspecified malignant neoplasm of axilla and upper limb lymph nodes: Secondary | ICD-10-CM | POA: Insufficient documentation

## 2014-05-15 DIAGNOSIS — C50511 Malignant neoplasm of 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: Secondary | ICD-10-CM | POA: Insufficient documentation

## 2014-05-15 DIAGNOSIS — C50919 Malignant neoplasm of unspecified site of unspecified female breast: Secondary | ICD-10-CM | POA: Diagnosis present

## 2014-05-15 DIAGNOSIS — E669 Obesity, unspecified: Secondary | ICD-10-CM | POA: Insufficient documentation

## 2014-05-15 DIAGNOSIS — C50411 Malignant neoplasm of upper-outer quadrant of right female breast: Secondary | ICD-10-CM | POA: Insufficient documentation

## 2014-05-15 DIAGNOSIS — Z170 Estrogen receptor positive status [ER+]: Secondary | ICD-10-CM | POA: Insufficient documentation

## 2014-05-15 DIAGNOSIS — Z7952 Long term (current) use of systemic steroids: Secondary | ICD-10-CM | POA: Insufficient documentation

## 2014-05-15 DIAGNOSIS — D0511 Intraductal carcinoma in situ of right breast: Secondary | ICD-10-CM | POA: Insufficient documentation

## 2014-05-15 DIAGNOSIS — Z79899 Other long term (current) drug therapy: Secondary | ICD-10-CM | POA: Insufficient documentation

## 2014-05-15 HISTORY — PX: MASTECTOMY MODIFIED RADICAL: SHX5962

## 2014-05-15 SURGERY — MASTECTOMY, MODIFIED RADICAL
Anesthesia: General | Site: Chest | Laterality: Right

## 2014-05-15 MED ORDER — ONDANSETRON HCL 4 MG/2ML IJ SOLN
INTRAMUSCULAR | Status: AC
  Filled 2014-05-15: qty 2

## 2014-05-15 MED ORDER — PROPOFOL 10 MG/ML IV BOLUS
INTRAVENOUS | Status: AC
  Filled 2014-05-15: qty 20

## 2014-05-15 MED ORDER — CALCIUM CARBONATE 600 MG PO TABS: 600.0000 mg | ORAL_TABLET | Freq: Every day | ORAL | Status: DC

## 2014-05-15 MED ORDER — LACTATED RINGERS IV SOLN
INTRAVENOUS | Status: DC
  Administered 2014-05-15: 1000 mL via INTRAVENOUS

## 2014-05-15 MED ORDER — FENTANYL CITRATE 0.05 MG/ML IJ SOLN
INTRAMUSCULAR | Status: AC
  Filled 2014-05-15: qty 2

## 2014-05-15 MED ORDER — FENTANYL CITRATE 0.05 MG/ML IJ SOLN
INTRAMUSCULAR | Status: AC
  Filled 2014-05-15: qty 5

## 2014-05-15 MED ORDER — MIDAZOLAM HCL 2 MG/2ML IJ SOLN
INTRAMUSCULAR | Status: AC
  Filled 2014-05-15: qty 2

## 2014-05-15 MED ORDER — LACTATED RINGERS IV SOLN
INTRAVENOUS | Status: DC | PRN
  Administered 2014-05-15: 07:00:00 via INTRAVENOUS

## 2014-05-15 MED ORDER — CEFAZOLIN SODIUM-DEXTROSE 2-3 GM-% IV SOLR
INTRAVENOUS | Status: AC
  Filled 2014-05-15: qty 50

## 2014-05-15 MED ORDER — ATORVASTATIN CALCIUM 20 MG PO TABS
20.0000 mg | ORAL_TABLET | Freq: Every day | ORAL | Status: DC
  Administered 2014-05-15 – 2014-05-16 (×2): 20 mg via ORAL
  Filled 2014-05-15 (×3): qty 1

## 2014-05-15 MED ORDER — ONDANSETRON HCL 4 MG PO TABS: 4.0000 mg | ORAL_TABLET | Freq: Four times a day (QID) | ORAL | Status: DC | PRN

## 2014-05-15 MED ORDER — FENTANYL CITRATE 0.05 MG/ML IJ SOLN
25.0000 ug | INTRAMUSCULAR | Status: DC | PRN
  Administered 2014-05-15 (×4): 25 ug via INTRAVENOUS

## 2014-05-15 MED ORDER — ONDANSETRON HCL 4 MG/2ML IJ SOLN
INTRAMUSCULAR | Status: DC | PRN
  Administered 2014-05-15: 4 mg via INTRAVENOUS

## 2014-05-15 MED ORDER — CEFAZOLIN SODIUM-DEXTROSE 2-3 GM-% IV SOLR
2.0000 g | INTRAVENOUS | Status: AC
  Administered 2014-05-15: 2 g via INTRAVENOUS

## 2014-05-15 MED ORDER — ONDANSETRON HCL 4 MG/2ML IJ SOLN: 4.0000 mg | Freq: Four times a day (QID) | INTRAMUSCULAR | Status: DC | PRN

## 2014-05-15 MED ORDER — PHENYLEPHRINE HCL 10 MG/ML IJ SOLN
INTRAMUSCULAR | Status: DC | PRN
  Administered 2014-05-15 (×2): 80 ug via INTRAVENOUS
  Administered 2014-05-15: 40 ug via INTRAVENOUS

## 2014-05-15 MED ORDER — MIDAZOLAM HCL 5 MG/5ML IJ SOLN
INTRAMUSCULAR | Status: DC | PRN
Start: 2014-05-15 — End: 2014-05-15
  Administered 2014-05-15: 2 mg via INTRAVENOUS

## 2014-05-15 MED ORDER — POLYETHYLENE GLYCOL 3350 17 G PO PACK: 17.0000 g | PACK | Freq: Every day | ORAL | Status: DC | PRN

## 2014-05-15 MED ORDER — CISATRACURIUM BESYLATE 20 MG/10ML IV SOLN
INTRAVENOUS | Status: AC
  Filled 2014-05-15: qty 10

## 2014-05-15 MED ORDER — DEXAMETHASONE SODIUM PHOSPHATE 10 MG/ML IJ SOLN
INTRAMUSCULAR | Status: AC
  Filled 2014-05-15: qty 1

## 2014-05-15 MED ORDER — PHENYLEPHRINE 40 MCG/ML (10ML) SYRINGE FOR IV PUSH (FOR BLOOD PRESSURE SUPPORT)
PREFILLED_SYRINGE | INTRAVENOUS | Status: AC
  Filled 2014-05-15: qty 10

## 2014-05-15 MED ORDER — PROMETHAZINE HCL 25 MG/ML IJ SOLN: 6.2500 mg | INTRAMUSCULAR | Status: DC | PRN

## 2014-05-15 MED ORDER — CALCIUM CARBONATE 1250 (500 CA) MG PO TABS
1.0000 | ORAL_TABLET | Freq: Every day | ORAL | Status: DC
  Administered 2014-05-15 – 2014-05-17 (×3): 500 mg via ORAL
  Filled 2014-05-15 (×4): qty 1

## 2014-05-15 MED ORDER — FENTANYL CITRATE 0.05 MG/ML IJ SOLN
INTRAMUSCULAR | Status: DC | PRN
  Administered 2014-05-15 (×3): 50 ug via INTRAVENOUS

## 2014-05-15 MED ORDER — ACETAMINOPHEN 10 MG/ML IV SOLN
1000.0000 mg | Freq: Once | INTRAVENOUS | Status: AC
  Administered 2014-05-15: 1000 mg via INTRAVENOUS
  Filled 2014-05-15: qty 100

## 2014-05-15 MED ORDER — DEXAMETHASONE SODIUM PHOSPHATE 10 MG/ML IJ SOLN
INTRAMUSCULAR | Status: DC | PRN
  Administered 2014-05-15: 10 mg via INTRAVENOUS

## 2014-05-15 MED ORDER — MORPHINE SULFATE 2 MG/ML IJ SOLN
1.0000 mg | INTRAMUSCULAR | Status: DC | PRN
  Administered 2014-05-15: 1 mg via INTRAVENOUS
  Administered 2014-05-15: 2 mg via INTRAVENOUS
  Administered 2014-05-15: 1 mg via INTRAVENOUS
  Filled 2014-05-15 (×3): qty 1

## 2014-05-15 MED ORDER — PROPOFOL 10 MG/ML IV BOLUS
INTRAVENOUS | Status: DC | PRN
  Administered 2014-05-15: 60 mg via INTRAVENOUS
  Administered 2014-05-15: 200 mg via INTRAVENOUS

## 2014-05-15 MED ORDER — HEPARIN SODIUM (PORCINE) 5000 UNIT/ML IJ SOLN
5000.0000 [IU] | Freq: Three times a day (TID) | INTRAMUSCULAR | Status: DC
  Administered 2014-05-16 – 2014-05-17 (×4): 5000 [IU] via SUBCUTANEOUS
  Filled 2014-05-15 (×7): qty 1

## 2014-05-15 MED ORDER — KCL IN DEXTROSE-NACL 20-5-0.9 MEQ/L-%-% IV SOLN
INTRAVENOUS | Status: DC
  Administered 2014-05-15: 12:00:00 via INTRAVENOUS
  Administered 2014-05-16: 1000 mL via INTRAVENOUS
  Filled 2014-05-15 (×6): qty 1000

## 2014-05-15 MED ORDER — OXYCODONE-ACETAMINOPHEN 5-325 MG PO TABS
1.0000 | ORAL_TABLET | ORAL | Status: DC | PRN
  Administered 2014-05-16 – 2014-05-17 (×2): 1 via ORAL
  Filled 2014-05-15 (×2): qty 1

## 2014-05-15 MED ORDER — SUCCINYLCHOLINE CHLORIDE 20 MG/ML IJ SOLN
INTRAMUSCULAR | Status: DC | PRN
  Administered 2014-05-15: 100 mg via INTRAVENOUS

## 2014-05-15 SURGICAL SUPPLY — 39 items
APL SKNCLS STERI-STRIP NONHPOA (GAUZE/BANDAGES/DRESSINGS) ×1
APPLIER CLIP 11 MED OPEN (CLIP) ×6
APPLIER CLIP 13 LRG OPEN (CLIP)
APR CLP LRG 13 20 CLIP (CLIP)
APR CLP MED 11 20 MLT OPN (CLIP) ×2
BENZOIN TINCTURE PRP APPL 2/3 (GAUZE/BANDAGES/DRESSINGS) ×3 IMPLANT
CLIP APPLIE 11 MED OPEN (CLIP) IMPLANT
CLIP APPLIE 13 LRG OPEN (CLIP) IMPLANT
CLOSURE WOUND 1/2 X4 (GAUZE/BANDAGES/DRESSINGS) ×1
DEVICE DISSECT PLASMABLAD 3.0S (MISCELLANEOUS) IMPLANT
DRAIN CHANNEL 19F RND (DRAIN) ×6 IMPLANT
DRAPE LAPAROSCOPIC ABDOMINAL (DRAPES) ×3 IMPLANT
DRAPE SHEET LG 3/4 BI-LAMINATE (DRAPES) IMPLANT
DRSG PAD ABDOMINAL 8X10 ST (GAUZE/BANDAGES/DRESSINGS) ×4 IMPLANT
DRSG XEROFORM 1X8 (GAUZE/BANDAGES/DRESSINGS) ×2 IMPLANT
ELECT REM PT RETURN 9FT ADLT (ELECTROSURGICAL) ×3
ELECTRODE REM PT RTRN 9FT ADLT (ELECTROSURGICAL) ×1 IMPLANT
EVACUATOR SILICONE 100CC (DRAIN) ×6 IMPLANT
GAUZE SPONGE 4X4 12PLY STRL (GAUZE/BANDAGES/DRESSINGS) ×6 IMPLANT
GAUZE SPONGE 4X4 16PLY XRAY LF (GAUZE/BANDAGES/DRESSINGS) ×2 IMPLANT
GLOVE BIO SURGEON STRL SZ7.5 (GLOVE) ×6 IMPLANT
GLOVE BIOGEL PI IND STRL 7.0 (GLOVE) ×1 IMPLANT
GLOVE BIOGEL PI INDICATOR 7.0 (GLOVE) ×2
GOWN STRL REUS W/TWL LRG LVL3 (GOWN DISPOSABLE) ×3 IMPLANT
GOWN STRL REUS W/TWL XL LVL3 (GOWN DISPOSABLE) ×6 IMPLANT
KIT BASIN OR (CUSTOM PROCEDURE TRAY) ×3 IMPLANT
NS IRRIG 1000ML POUR BTL (IV SOLUTION) ×3 IMPLANT
PACK GENERAL/GYN (CUSTOM PROCEDURE TRAY) ×3 IMPLANT
PEN SKIN MARKING BROAD (MISCELLANEOUS) ×3 IMPLANT
PLASMABLADE 3.0S (MISCELLANEOUS) ×3
SPONGE LAP 18X18 X RAY DECT (DISPOSABLE) ×6 IMPLANT
STAPLER VISISTAT 35W (STAPLE) ×3 IMPLANT
STRIP CLOSURE SKIN 1/2X4 (GAUZE/BANDAGES/DRESSINGS) ×2 IMPLANT
SUT ETHILON 3 0 PS 1 (SUTURE) ×6 IMPLANT
SUT SILK 3 0 (SUTURE) ×3
SUT SILK 3-0 18XBRD TIE 12 (SUTURE) ×1 IMPLANT
SUT VIC AB 3-0 SH 18 (SUTURE) ×3 IMPLANT
TOWEL OR 17X26 10 PK STRL BLUE (TOWEL DISPOSABLE) ×6 IMPLANT
WATER STERILE IRR 1500ML POUR (IV SOLUTION) ×3 IMPLANT

## 2014-05-15 NOTE — Op Note (Signed)
05/15/2014  9:14 AM  PATIENT:  Karen Douglas  59 y.o. female  PRE-OPERATIVE DIAGNOSIS:  Right breast Cancer  POST-OPERATIVE DIAGNOSIS:  Right breast Cancer  PROCEDURE:  Procedure(s): RIGHT MASTECTOMY MODIFIED RADICAL (Right)  SURGEON:  Surgeon(s) and Role:    * Jovita Kussmaul, MD - Primary  PHYSICIAN ASSISTANT:   ASSISTANTS: none   ANESTHESIA:   general  EBL:  Total I/O In: -  Out: 200 [Blood:200]  BLOOD ADMINISTERED:none  DRAINS: (2) Jackson-Pratt drain(s) with closed bulb suction in the prepectoral space   LOCAL MEDICATIONS USED:  NONE  SPECIMEN:  Source of Specimen:  right mastectomy with lymph nodes  DISPOSITION OF SPECIMEN:  PATHOLOGY  COUNTS:  YES  TOURNIQUET:  * No tourniquets in log *  DICTATION: .Dragon Dictation  After informed consent was obtained the patient was brought to the operating room and placed in the supine position on the operating room table. After adequate induction of general anesthesia the patient's right chest, breast, and axillary area were prepped with ChloraPrep, allowed to dry, and draped in usual sterile manner. An elliptical incision was made around the nipple and areola complex in order to minimize the excess skin. The incision was carried through the skin and subcutaneous tissue sharply with the plasma blade. Breast hooks were used to elevate the skin flaps anteriorly towards the ceiling. Thin skin flaps were created circumferentially around the incision between the subcutaneous tissue and the breast tissue. This was done sharply with the plasma blade until the dissection reached the chest wall. Next the breast was removed from the pectoralis muscle with the pectoralis fascia. Laterally we identified the latissimus muscle laterally, the serratus muscle medially, and the axillary vein superiorly. The contents of the axilla within these boundaries was dissected out bluntly with a right angle clamp. Several lymphatics and small vessels were  controlled with clips. The long thoracic and thoracodorsal nerves were identified and spared. Hemostasis was achieved using the Bovie electrocautery. The wound was irrigated with copious amounts of saline. 2 small stab incisions were made on the anterior axillary line inferior to the operative area. A tonsil clamp was placed through each of these openings and used to bring a 19 [place] round Blake drain into the operative bed. The lateral drain was placed in the axilla and the medial drain was placed along the chest wall. The drains were anchored to the skin with 3-0 nylon stitches. The superior and inferior flaps were grossly reapproximated with interrupted 3-0 Vicryl stitches. The skin was then closed with staples. Sterile dressings were applied. The drains were placed to bulb suction and the was a good seal. The patient tolerated the procedure well. At the end of the case all needle sponge and instrument counts were correct. The patient was then awakened and taken to recovery in stable condition.  PLAN OF CARE: Admit for overnight observation  PATIENT DISPOSITION:  PACU - hemodynamically stable.   Delay start of Pharmacological VTE agent (>24hrs) due to surgical blood loss or risk of bleeding: no

## 2014-05-15 NOTE — Interval H&P Note (Signed)
History and Physical Interval Note:  05/15/2014 6:54 AM  Karen Douglas  has presented today for surgery, with the diagnosis of Right breast Cancer  The various methods of treatment have been discussed with the patient and family. After consideration of risks, benefits and other options for treatment, the patient has consented to  Procedure(s): RIGHT MASTECTOMY MODIFIED RADICAL (Right) as a surgical intervention .  The patient's history has been reviewed, patient examined, no change in status, stable for surgery.  I have reviewed the patient's chart and labs.  Questions were answered to the patient's satisfaction.     TOTH III,Jace Dowe S

## 2014-05-15 NOTE — H&P (Signed)
Karen Douglas 05/05/2014 4:10 PM Location: [facility] Patient #: [id number] DOB: 19-Feb-1956 Married / Language: English / Race: Black or African American Female  History of Present Illness Karen Douglas. [name] MD; 05/05/2014 4:39 PM) The patient is a 59 year old female who presents for a follow-up for Breast cancer. The patient is a 59 year old black female who originally presented with a locally advanced 11 cm cancer that was present in all 4 quadrants of her right breast. She has finished her neoadjuvant chemotherapy and has tolerated it well. Her tumor has definitely regressed but she still has evidence of disease in all 4 quadrants. She also had positive lymph nodes when she presented. She is ready to begin scheduling of her definitive surgery.   Other Problems Illene Regulus, MA; 05/05/2014 4:10 PM) Breast Cancer  Diagnostic Studies History Lars Mage [place], MA; 05/05/2014 4:10 PM) Colonoscopy 1-5 years ago Mammogram within last year Pap Smear 1-5 years ago  Allergies Illene Regulus, MA; 05/05/2014 4:11 PM) No Known Drug Allergies01/29/2016  Medication History (Alisha Spillers, MA; 05/05/2014 4:12 PM) Dexamethasone (4MG  Tablet, Oral) Active. Atorvastatin Calcium (20MG  Tablet, Oral) Active. Oscal 500/200 D-3 (500-200MG -UNIT Tablet, Oral) Active. Medications Reconciled  Social History Lars Mage [place], [place]; 05/05/2014 4:10 PM) Alcohol use Occasional alcohol use, Recently quit alcohol use. Caffeine use Coffee. No drug use Tobacco use Never smoker.  Family History Lars Mage [place], [place]; 05/05/2014 4:10 PM) Alcohol Abuse Brother. Diabetes Mellitus Mother. Hypertension Mother, Sister. Kidney Disease Mother.  Pregnancy / Birth History Lars Mage [place], [place]; 05/05/2014 4:10 PM) Maternal age 56-25 Para 2  Review of Systems Lars Mage [place] MA; 05/05/2014 4:10 PM) Skin Not Present- Change in Wart/Mole, Dryness, Hives, Jaundice, New Lesions, Non-Healing  Wounds, Rash and Ulcer. HEENT Not Present- Earache, Hearing Loss, Hoarseness, Nose Bleed, Oral Ulcers, Ringing in the Ears, Seasonal Allergies, Sinus Pain, Sore Throat, Visual Disturbances, Wears glasses/contact lenses and Yellow Eyes. Respiratory Not Present- Bloody sputum, Chronic Cough, Difficulty Breathing, Snoring and Wheezing. Cardiovascular Present- Leg Cramps and Swelling of Extremities. Not Present- Chest Pain, Difficulty Breathing Lying Down, Palpitations, Rapid Heart Rate and Shortness of Breath. Gastrointestinal Present- Constipation. Not Present- Abdominal Pain, Bloating, Bloody Stool, Change in Bowel Habits, Chronic diarrhea, Difficulty Swallowing, Excessive gas, Gets full quickly at meals, Hemorrhoids, Indigestion, Nausea, Rectal Pain and Vomiting. Female Genitourinary Not Present- Frequency, Nocturia, Painful Urination, Pelvic Pain and Urgency. Musculoskeletal Present- Joint Pain. Not Present- Back Pain, Joint Stiffness, Muscle Pain, Muscle Weakness and Swelling of Extremities. Neurological Not Present- Decreased Memory, Fainting, Headaches, Numbness, Seizures, Tingling, Tremor, Trouble walking and Weakness. Hematology Not Present- Easy Bruising, Excessive bleeding, Gland problems, HIV and Persistent Infections.   Vitals (Alisha Spillers MA; 05/05/2014 4:11 PM) 05/05/2014 4:11 PM Weight: 198 lb Height: 61in Body Surface Area: 1.97 m Body Mass Index: 37.41 kg/m Pulse: 84 (Regular)  BP: 122/78 (Sitting, Left Arm, Standard)    Physical Exam [name] S. [name] MD; 05/05/2014 4:42 PM) General Mental Status-Alert. General Appearance-Consistent with stated age. Hydration-Well hydrated. Voice-Normal.  Head and Neck Head-normocephalic, atraumatic with no lesions or palpable masses. Trachea-midline. Thyroid Gland Characteristics - normal size and consistency.  Eye Eyeball - Bilateral-Extraocular movements intact. Sclera/Conjunctiva - Bilateral-No scleral  icterus.  Chest and Lung Exam Chest and lung exam reveals -quiet, even and easy respiratory effort with no use of accessory muscles and on auscultation, normal breath sounds, no adventitious sounds and normal vocal resonance. Inspection Chest Wall - Normal. Back - normal.  Breast Note: There is some palpable fullness centrally in the right breast.  There is also a broad-based skin tag on the inferior right breast. This has been present for many many years. There is no palpable mass in the left breast. There is a similar skin tag on the left breast. There is no palpable axillary, supraclavicular, or cervical lymphadenopathy.   Cardiovascular Cardiovascular examination reveals -normal heart sounds, regular rate and rhythm with no murmurs and normal pedal pulses bilaterally.  Abdomen Inspection Inspection of the abdomen reveals - No Hernias. Skin - Scar - no surgical scars. Palpation/Percussion Palpation and Percussion of the abdomen reveal - Soft, Non Tender, No Rebound tenderness, No Rigidity (guarding) and No hepatosplenomegaly. Auscultation Auscultation of the abdomen reveals - Bowel sounds normal.  Neurologic Neurologic evaluation reveals -alert and oriented x 3 with no impairment of recent or remote memory. Mental Status-Normal.  Musculoskeletal Normal Exam - Left-Upper Extremity Strength Normal and Lower Extremity Strength Normal. Normal Exam - Right-Upper Extremity Strength Normal and Lower Extremity Strength Normal.  Lymphatic Head & Neck  General Head & Neck Lymphatics: Bilateral - Description - Normal. Axillary  General Axillary Region: Bilateral - Description - Normal. Tenderness - Non Tender. Femoral & Inguinal  Generalized Femoral & Inguinal Lymphatics: Bilateral - Description - Normal. Tenderness - Non Tender.    Assessment & Plan [name] S. [name] MD; 05/05/2014 4:36 PM) MALIGNANT NEOPLASM OF UPPER-OUTER QUADRANT OF RIGHT FEMALE BREAST (174.4   C50.411) Impression: The patient had a locally advanced right breast cancer. She has responded well to neoadjuvant chemotherapy. Unfortunately she has multifocal disease with positive lymph nodes so my recommendation for her would be a right modified radical mastectomy. I have discussed with her in detail the risks and benefits of the operation to remove the breast as well as the lymph nodes including some of the technical aspects and she understands and wishes to proceed.     Signed by Luella Cook, MD (05/05/2014 4:55 PM)

## 2014-05-15 NOTE — Transfer of Care (Signed)
Immediate Anesthesia Transfer of Care Note  Patient: Karen Douglas  Procedure(s) Performed: Procedure(s): RIGHT MASTECTOMY MODIFIED RADICAL (Right)  Patient Location: PACU  Anesthesia Type:General  Level of Consciousness: awake, alert  and patient cooperative  Airway & Oxygen Therapy: Patient Spontanous Breathing and Patient connected to face mask oxygen  Post-op Assessment: Report given to RN and Post -op Vital signs reviewed and stable  Post vital signs: Reviewed and stable  Last Vitals:  Filed Vitals:   05/15/14 0509  BP: 110/64  Pulse: 103  Temp: 36.7 C  Resp: 18    Complications: No apparent anesthesia complications

## 2014-05-15 NOTE — Anesthesia Procedure Notes (Addendum)
Procedure Name: LMA Insertion Date/Time: 05/15/2014 7:19 AM Performed by: Dione Booze Pre-anesthesia Checklist: Patient identified, Emergency Drugs available, Suction available and Patient being monitored Patient Re-evaluated:Patient Re-evaluated prior to inductionOxygen Delivery Method: Circle system utilized Preoxygenation: Pre-oxygenation with 100% oxygen Intubation Type: IV induction LMA: LMA with gastric port inserted LMA Size: 4.0 Number of attempts: 1 Tube secured with: Tape Dental Injury: Teeth and Oropharynx as per pre-operative assessment    Procedure Name: Intubation Date/Time: 05/15/2014 7:35 AM Performed by: Dione Booze Pre-anesthesia Checklist: Patient identified, Suction available, Emergency Drugs available and Patient being monitored Patient Re-evaluated:Patient Re-evaluated prior to inductionOxygen Delivery Method: Circle system utilized Preoxygenation: Pre-oxygenation with 100% oxygen Intubation Type: IV induction Grade View: Grade II Tube type: Oral Tube size: 7.5 mm Number of attempts: 2 (dentition obscures tube insertion) Airway Equipment and Method: Stylet Secured at: 21 cm Tube secured with: Tape Dental Injury: Teeth and Oropharynx as per pre-operative assessment

## 2014-05-15 NOTE — Anesthesia Preprocedure Evaluation (Addendum)
Anesthesia Evaluation  Patient identified by MRN, date of birth, ID band Patient awake    Reviewed: Allergy & Precautions, NPO status , Patient's Chart, lab work & pertinent test results  Airway Mallampati: II  TM Distance: >3 FB Neck ROM: Full    Dental  (+) Poor Dentition, Loose,    Pulmonary neg pulmonary ROS,  breath sounds clear to auscultation  Pulmonary exam normal       Cardiovascular negative cardio ROS  Rhythm:Regular Rate:Normal     Neuro/Psych  Headaches, negative psych ROS   GI/Hepatic negative GI ROS, Neg liver ROS,   Endo/Other  negative endocrine ROS  Renal/GU negative Renal ROS  negative genitourinary   Musculoskeletal negative musculoskeletal ROS (+)   Abdominal (+) + obese,   Peds negative pediatric ROS (+)  Hematology negative hematology ROS (+)   Anesthesia Other Findings   Reproductive/Obstetrics negative OB ROS                            Anesthesia Physical Anesthesia Plan  ASA: II  Anesthesia Plan: General   Post-op Pain Management:    Induction: Intravenous  Airway Management Planned: LMA  Additional Equipment:   Intra-op Plan:   Post-operative Plan: Extubation in OR  Informed Consent: I have reviewed the patients History and Physical, chart, labs and discussed the procedure including the risks, benefits and alternatives for the proposed anesthesia with the patient or authorized representative who has indicated his/her understanding and acceptance.   Dental advisory given  Plan Discussed with: CRNA  Anesthesia Plan Comments:         Anesthesia Quick Evaluation

## 2014-05-15 NOTE — Anesthesia Postprocedure Evaluation (Signed)
  Anesthesia Post-op Note  Patient: Karen Douglas  Procedure(s) Performed: Procedure(s) (LRB): RIGHT MASTECTOMY MODIFIED RADICAL (Right)  Patient Location: PACU  Anesthesia Type: General  Level of Consciousness: awake and alert   Airway and Oxygen Therapy: Patient Spontanous Breathing  Post-op Pain: mild  Post-op Assessment: Post-op Vital signs reviewed, Patient's Cardiovascular Status Stable, Respiratory Function Stable, Patent Airway and No signs of Nausea or vomiting  Last Vitals:  Filed Vitals:   05/15/14 1045  BP: 134/83  Pulse: 95  Temp: 36.9 C  Resp: 14    Post-op Vital Signs: stable   Complications: No apparent anesthesia complications

## 2014-05-16 ENCOUNTER — Encounter (HOSPITAL_COMMUNITY): Payer: Self-pay | Admitting: General Surgery

## 2014-05-16 ENCOUNTER — Other Ambulatory Visit: Payer: Self-pay

## 2014-05-16 DIAGNOSIS — C50311 Malignant neoplasm of lower-inner quadrant of right female breast: Secondary | ICD-10-CM | POA: Diagnosis not present

## 2014-05-16 LAB — BASIC METABOLIC PANEL
Anion gap: 5 (ref 5–15)
BUN: 10 mg/dL (ref 6–23)
CALCIUM: 8.4 mg/dL (ref 8.4–10.5)
CO2: 22 mmol/L (ref 19–32)
Chloride: 114 mmol/L — ABNORMAL HIGH (ref 96–112)
Creatinine, Ser: 0.7 mg/dL (ref 0.50–1.10)
GFR calc Af Amer: >90 mL/min (ref >90)
GFR calc non Af Amer: >90 mL/min (ref >90)
GLUCOSE: 198 mg/dL — AB (ref 70–99)
POTASSIUM: 5.1 mmol/L (ref 3.5–5.1)
Sodium: 141 mmol/L (ref 135–145)

## 2014-05-16 LAB — CBC
HCT: 29.3 % — ABNORMAL LOW (ref 36.0–46.0)
Hemoglobin: 9.2 g/dL — ABNORMAL LOW (ref 12.0–15.0)
MCH: 28.8 pg (ref 26.0–34.0)
MCHC: 31.4 g/dL (ref 30.0–36.0)
MCV: 91.6 fL (ref 78.0–100.0)
PLATELETS: 301 10*3/uL (ref 150–400)
RBC: 3.2 MIL/uL — ABNORMAL LOW (ref 3.87–5.11)
RDW: 20.4 % — AB (ref 11.5–15.5)
WBC: 7.2 10*3/uL (ref 4.0–10.5)

## 2014-05-16 NOTE — Progress Notes (Signed)
1 Day Post-Op  Subjective: She complains of a fair bit of pain. Hasn't had any drain teaching  Objective: Vital signs in last 24 hours: Temp:  [97.9 F (36.6 C)-99.1 F (37.3 C)] 98.4 F (36.9 C) (02/09 0518) Pulse Rate:  [80-102] 90 (02/09 0518) Resp:  [14-20] 18 (02/09 0518) BP: (103-138)/(67-94) 103/67 mmHg (02/09 0518) SpO2:  [93 %-100 %] 100 % (02/09 0518)    Intake/Output from previous day: 02/08 0701 - 02/09 0700 In: 3355 [P.O.:240; I.V.:3090] Out: 2395 [Urine:2050; Drains:145; Blood:200] Intake/Output this shift:    Resp: clear to auscultation bilaterally Chest wall: skin flaps look healthy and viable Cardio: regular rate and rhythm GI: soft, non-tender; bowel sounds normal; no masses,  no organomegaly  Lab Results:   Recent Labs  05/16/14 0438  WBC 7.2  HGB 9.2*  HCT 29.3*  PLT 301   BMET  Recent Labs  05/16/14 0438  NA 141  K 5.1  CL 114*  CO2 22  GLUCOSE 198*  BUN 10  CREATININE 0.70  CALCIUM 8.4   PT/INR No results for input(s): LABPROT, INR in the last 72 hours. ABG No results for input(s): PHART, HCO3 in the last 72 hours.  Invalid input(s): PCO2, PO2  Studies/Results: No results found.  Anti-infectives: Anti-infectives    Start     Dose/Rate Route Frequency Ordered Stop   05/15/14 0511  ceFAZolin (ANCEF) IVPB 2 g/50 mL premix     2 g100 mL/hr over 30 Minutes Intravenous On call to O.R. 05/15/14 0511 05/15/14 0731      Assessment/Plan: s/p Procedure(s): RIGHT MASTECTOMY MODIFIED RADICAL (Right) Advance diet  Teach pt and family how to care for drains She will probably be ready for discharge tomorrow  LOS: 1 day    TOTH III,PAUL S 05/16/2014

## 2014-05-17 ENCOUNTER — Telehealth: Payer: Self-pay | Admitting: Hematology and Oncology

## 2014-05-17 DIAGNOSIS — C50311 Malignant neoplasm of lower-inner quadrant of right female breast: Secondary | ICD-10-CM | POA: Diagnosis not present

## 2014-05-17 LAB — BASIC METABOLIC PANEL
Anion gap: 5 (ref 5–15)
BUN: 13 mg/dL (ref 6–23)
CHLORIDE: 111 mmol/L (ref 96–112)
CO2: 24 mmol/L (ref 19–32)
Calcium: 8.3 mg/dL — ABNORMAL LOW (ref 8.4–10.5)
Creatinine, Ser: 0.81 mg/dL (ref 0.50–1.10)
GFR, EST NON AFRICAN AMERICAN: 79 mL/min — AB (ref >90)
Glucose, Bld: 94 mg/dL (ref 70–99)
Potassium: 4.1 mmol/L (ref 3.5–5.1)
Sodium: 140 mmol/L (ref 135–145)

## 2014-05-17 LAB — CBC
HEMATOCRIT: 29.9 % — AB (ref 36.0–46.0)
Hemoglobin: 9.2 g/dL — ABNORMAL LOW (ref 12.0–15.0)
MCH: 28.1 pg (ref 26.0–34.0)
MCHC: 30.8 g/dL (ref 30.0–36.0)
MCV: 91.4 fL (ref 78.0–100.0)
PLATELETS: 323 10*3/uL (ref 150–400)
RBC: 3.27 MIL/uL — AB (ref 3.87–5.11)
RDW: 20.5 % — ABNORMAL HIGH (ref 11.5–15.5)
WBC: 4.1 10*3/uL (ref 4.0–10.5)

## 2014-05-17 MED ORDER — OXYCODONE-ACETAMINOPHEN 5-325 MG PO TABS: 1.0000 | ORAL_TABLET | ORAL | Status: DC | PRN

## 2014-05-17 NOTE — Progress Notes (Signed)
Discharge instructions reviewed with patient and pt's spouse, prescription given, dressing to right chest is clean dry and intact, vital signs are stable, patient to follow up with physican to have JP drains removed, patient and patient's spouse taught of JP drain care, questions and concerns answered Neta Mends RN 05-17-2013 16:13pm

## 2014-05-17 NOTE — Telephone Encounter (Signed)
, °

## 2014-05-17 NOTE — Progress Notes (Signed)
Patient and patient's spouse educated on the care of her JP Drains, watched spouse empty and recharged drains and he performed well Neta Mends RN 05-17-2013 10:51 AM

## 2014-05-17 NOTE — Progress Notes (Signed)
2 Days Post-Op  Subjective: Feels better today  Objective: Vital signs in last 24 hours: Temp:  [98.6 F (37 C)-98.9 F (37.2 C)] 98.9 F (37.2 C) (02/10 0550) Pulse Rate:  [81-89] 81 (02/10 0550) Resp:  [18] 18 (02/10 0550) BP: (111-125)/(69-77) 125/77 mmHg (02/10 0550) SpO2:  [100 %] 100 % (02/10 0550)    Intake/Output from previous day: 02/09 0701 - 02/10 0700 In: [place] [P.O.:1160; I.V.:675] Out: 1970 [Urine:1850; Drains:120] Intake/Output this shift: Total I/O In: 240 [P.O.:240] Out: 0   Resp: clear to auscultation bilaterally Chest wall: skin flaps look healthy and viable Cardio: regular rate and rhythm GI: soft, non-tender; bowel sounds normal; no masses,  no organomegaly  Lab Results:   Recent Labs  05/16/14 0438 05/17/14 0544  WBC 7.2 4.1  HGB 9.2* 9.2*  HCT 29.3* 29.9*  PLT 301 323   BMET  Recent Labs  05/16/14 0438 05/17/14 0544  NA 141 140  K 5.1 4.1  CL 114* 111  CO2 22 24  GLUCOSE 198* 94  BUN 10 13  CREATININE 0.70 0.81  CALCIUM 8.4 8.3*   PT/INR No results for input(s): LABPROT, INR in the last 72 hours. ABG No results for input(s): PHART, HCO3 in the last 72 hours.  Invalid input(s): PCO2, PO2  Studies/Results: No results found.  Anti-infectives: Anti-infectives    Start     Dose/Rate Route Frequency Ordered Stop   05/15/14 0511  ceFAZolin (ANCEF) IVPB 2 g/50 mL premix     2 g 100 mL/hr over 30 Minutes Intravenous On call to O.R. 05/15/14 0511 05/15/14 0731      Assessment/Plan: s/p Procedure(s): RIGHT MASTECTOMY MODIFIED RADICAL (Right) Discharge     TOTH III,Xzander Gilham S 05/17/2014

## 2014-05-17 NOTE — Discharge Summary (Signed)
Physician Discharge Summary  Patient ID: Karen Douglas MRN: [id number] DOB/AGE: 1955/09/23 59 y.o.  Admit date: 05/15/2014 Discharge date: 05/17/2014  Admission Diagnoses:  Discharge Diagnoses:  Active Problems:   Breast cancer, female   Discharged Condition: good  Hospital Course: the pt underwent right modified radical mastectomy. Postop she had issues with pain control. On pod 2 she was ready for discharge home  Consults: None  Significant Diagnostic Studies: none  Treatments: surgery: as above  Discharge Exam: Blood pressure 125/77, pulse 81, temperature 98.9 F (37.2 C), temperature source Oral, resp. rate 18, height 5\' 1"  (1.549 m), weight 195 lb (88.451 kg), SpO2 100 %. Chest wall: skin flaps healthy and viable  Disposition: 01-Home or Self Care  Discharge Instructions    Call MD for:  difficulty breathing, headache or visual disturbances    Complete by:  As directed      Call MD for:  extreme fatigue    Complete by:  As directed      Call MD for:  hives    Complete by:  As directed      Call MD for:  persistant dizziness or light-headedness    Complete by:  As directed      Call MD for:  persistant nausea and vomiting    Complete by:  As directed      Call MD for:  redness, tenderness, or signs of infection (pain, swelling, redness, odor or green/yellow discharge around incision site)    Complete by:  As directed      Call MD for:  severe uncontrolled pain    Complete by:  As directed      Call MD for:  temperature >100.4    Complete by:  As directed      Diet - low sodium heart healthy    Complete by:  As directed      Discharge instructions    Complete by:  As directed   Sponge bathe while drains are in. No overhead activity. Empty drain bulb, record output, and recharge bulb twice a day     Increase activity slowly    Complete by:  As directed      No wound care    Complete by:  As directed             Medication List    TAKE these medications         atorvastatin 20 MG tablet  Commonly known as:  LIPITOR  Take 20 mg by mouth daily.     calcium carbonate 600 MG Tabs tablet  Commonly known as:  OS-CAL  Take 600 mg by mouth daily with breakfast.     dexamethasone 4 MG tablet  Commonly known as:  DECADRON  Take 2 tablets (8 mg total) by mouth 2 (two) times daily. Start the day before Taxotere. Take once the day after, then 2 times a day x 2d.     lidocaine-prilocaine cream  Commonly known as:  EMLA  Apply 1 application topically as needed.     LORazepam 0.5 MG tablet  Commonly known as:  ATIVAN  Take 1 tablet (0.5 mg total) by mouth every 6 (six) hours as needed (Nausea or vomiting).     ondansetron 8 MG tablet  Commonly known as:  ZOFRAN  Take 1 tablet (8 mg total) by mouth 2 (two) times daily as needed (Nausea or vomiting). Begin 4 days after chemotherapy.     oxyCODONE-acetaminophen 5-325 MG per tablet  Commonly  known as:  PERCOCET/ROXICET  Take 1 tablet by mouth every 4 (four) hours as needed for severe pain.     oxyCODONE-acetaminophen 5-325 MG per tablet  Commonly known as:  ROXICET  Take 1-2 tablets by mouth every 4 (four) hours as needed for severe pain.     polyethylene glycol packet  Commonly known as:  MIRALAX / GLYCOLAX  Take 17 g by mouth daily as needed for mild constipation.     prochlorperazine 10 MG tablet  Commonly known as:  COMPAZINE  Take 1 tablet (10 mg total) by mouth every 6 (six) hours as needed (Nausea or vomiting).         SignedMerrie Roof 05/17/2014, 3:37 PM

## 2014-06-06 ENCOUNTER — Telehealth: Payer: Self-pay | Admitting: Hematology and Oncology

## 2014-06-06 ENCOUNTER — Ambulatory Visit: Payer: 59 | Admitting: Hematology and Oncology

## 2014-06-06 ENCOUNTER — Other Ambulatory Visit: Payer: Self-pay

## 2014-06-06 NOTE — Telephone Encounter (Signed)
S/w pt and her missed appt has been r/s  [employer]

## 2014-06-06 NOTE — Assessment & Plan Note (Signed)
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  Pathology review: I discussed the pathology report in great detail. Unfortunately patient still has extensive amount of residual disease even though post neoadjuvant chemotherapy MRI showed marked improvement in the disease. 3 or 4 lymph nodes are positive. All of this raises the concern and risk of recurrence.  Recommendation: 1. Adjuvant radiation followed by 2. Antiestrogen therapy.  Will obtain an appointment to see radiation oncology and I will follow-up towards end of radiation to start Antiestrogen therapy planning.

## 2014-06-13 ENCOUNTER — Telehealth: Payer: Self-pay | Admitting: Hematology and Oncology

## 2014-06-13 ENCOUNTER — Telehealth: Payer: Self-pay

## 2014-06-13 ENCOUNTER — Ambulatory Visit (HOSPITAL_BASED_OUTPATIENT_CLINIC_OR_DEPARTMENT_OTHER): Payer: 59 | Admitting: Hematology and Oncology

## 2014-06-13 VITALS — BP 108/69 | HR 84 | Temp 97.9°F | Resp 18 | Ht 61.0 in | Wt 187.7 lb

## 2014-06-13 DIAGNOSIS — C7951 Secondary malignant neoplasm of bone: Secondary | ICD-10-CM

## 2014-06-13 DIAGNOSIS — C50511 Malignant neoplasm of 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: Secondary | ICD-10-CM

## 2014-06-13 DIAGNOSIS — Z170 Estrogen receptor positive status [ER+]: Secondary | ICD-10-CM

## 2014-06-13 DIAGNOSIS — C779 Secondary and unspecified malignant neoplasm of lymph node, unspecified: Secondary | ICD-10-CM

## 2014-06-13 DIAGNOSIS — C50811 Malignant neoplasm of overlapping sites of right female breast: Secondary | ICD-10-CM

## 2014-06-13 NOTE — Assessment & Plan Note (Signed)
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  Treatment plan: Adjuvant radiation therapy to the chest wall and axilla and consideration for radiation therapy to the left ninth rib. Patient will meet Dr. Pablo Ledger make the final decision.  Return to clinic after radiation therapy to discuss starting antiestrogen therapy with aromatase inhibitor anastrozole 1 mg daily.

## 2014-06-13 NOTE — Telephone Encounter (Signed)
Pt states she will need a letter for her insurance company. This insurance covers loans she is unable to pay while she is out of work.  It needs the details of why she is out of work, and how long it is projected she will be out of work, preferably a specific return to work date. Call pt when letter ready.

## 2014-06-13 NOTE — Telephone Encounter (Signed)
per pof to sch pt appt-gave pt copy of sch °

## 2014-06-13 NOTE — Progress Notes (Signed)
Patient Care Team: Iona Beard, MD as PCP - General (Family Medicine) Autumn Messing III, MD as Consulting Physician (General Surgery) Nicholas Lose, MD as Consulting Physician (Hematology and Oncology) Arloa Koh, MD as Consulting Physician (Radiation Oncology)  DIAGNOSIS: Breast cancer of 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   Staging form: Breast, AJCC 7th Edition     Clinical: Stage IV (T4b, N1, M1) - Signed by Rulon Eisenmenger, MD on 12/30/2013     Pathologic: No stage assigned - Unsigned   SUMMARY OF ONCOLOGIC HISTORY:   Breast cancer of 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   12/01/2013 Initial Diagnosis Breast cancer of 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   12/06/2013 Breast MRI Enhancing tumor throughout the right breast all quadrants with diffuse skin thickening 11.5 x 11 x 5 cm: Spiculated mass posterior UOQ right breast 2.6 x 2.5 x 2 cm and abuts pectoralis muscle: 2 abnormal axillary lymph nodes largest 2.2 cm   12/20/2013 PET scan Stage IV disease noted with 1. Diffuse hypermetabolic right breast inflammatory carcinoma.  Metastatic lymphadenopathy in the right axilla, mediastinum, and bilateral hilar regions, small bilateral pulmonary metastases, possible 9th rib metastases   12/30/2013 - 04/14/2014 Chemotherapy Taxotere and cytoxan given on day 1 of a 21 day cycle with neulasta given on day 2 for granulocyte support.  A total of 6 cycles are planned with CT chest and mammo/ultrasound planned after cycle 3.    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 She underwent mastectomy on 05/15/2014 which revealed significant residual disease measuring 11.5 cm and 3 out of 4 lymph nodes were positive with lymphovascular invasion. Her final pathologic staging is T3 N1 M0 stage IIIa. She is here today to discuss the results of mastectomy and to talk about adjuvant treatment plan.  REVIEW OF SYSTEMS:   Constitutional: Denies fevers, chills or abnormal weight loss Eyes: Denies blurriness of vision Ears, nose, mouth, throat, and face: Face has not fully returned Respiratory: Denies cough, dyspnea or wheezes Cardiovascular: Denies palpitation, chest discomfort or lower extremity swelling Gastrointestinal:  Denies nausea, heartburn or change in bowel habits Skin: Denies abnormal skin rashes Lymphatics: Denies new lymphadenopathy or easy bruising Neurological:Denies numbness, tingling or new weaknesses Behavioral/Psych: Mood is stable, no new changes  Breast: Sore from recent surgery All other systems were reviewed with the patient and are negative.  I have reviewed the past medical history, past surgical history, social history and family history with the patient and they are unchanged from previous note.  ALLERGIES:  has No Known Allergies.  MEDICATIONS:  Current Outpatient Prescriptions  Medication Sig Dispense Refill  . atorvastatin (LIPITOR)  20 MG tablet Take 20 mg by mouth daily.    . calcium carbonate (OS-CAL) 600 MG TABS tablet Take 600 mg by mouth  daily with breakfast.    . dexamethasone (DECADRON) 4 MG tablet Take 2 tablets (8 mg total) by mouth 2 (two) times daily. Start the day before Taxotere. Take once the day after, then 2 times a day x 2d. (Patient not taking: Reported on 05/11/2014) 30 tablet 1  . lidocaine-prilocaine (EMLA) cream Apply 1 application topically as needed. (Patient not taking: Reported on 05/11/2014) 30 g 0  . LORazepam (ATIVAN) 0.5 MG tablet Take 1 tablet (0.5 mg total) by mouth every 6 (six) hours as needed (Nausea or vomiting). (Patient not taking: Reported on 05/11/2014) 30 tablet 0  . ondansetron (ZOFRAN) 8 MG tablet Take 1 tablet (8 mg total) by mouth 2 (two) times daily as needed (Nausea or vomiting). Begin 4 days after chemotherapy. (Patient not taking: Reported on 05/11/2014) 30 tablet 1  . oxyCODONE-acetaminophen (PERCOCET/ROXICET) 5-325 MG per tablet Take 1 tablet by mouth every 4 (four) hours as needed for severe pain. (Patient not taking: Reported on 05/11/2014) 30 tablet 0  . oxyCODONE-acetaminophen (ROXICET) 5-325 MG per tablet Take 1-2 tablets by mouth every 4 (four) hours as needed for severe pain. 50 tablet 0  . polyethylene glycol (MIRALAX / GLYCOLAX) packet Take 17 g by mouth daily as needed for mild constipation.    . prochlorperazine (COMPAZINE) 10 MG tablet Take 1 tablet (10 mg total) by mouth every 6 (six) hours as needed (Nausea or vomiting). (Patient not taking: Reported on 05/11/2014) 30 tablet 1   No current facility-administered medications for this visit.    PHYSICAL EXAMINATION: ECOG PERFORMANCE STATUS: 1 - Symptomatic but completely ambulatory  There were no vitals filed for this visit. There were no vitals filed for this visit.  GENERAL:alert, no distress and comfortable SKIN: skin color, texture, turgor are normal, no rashes or significant lesions EYES: normal, Conjunctiva are pink and non-injected, sclera clear OROPHARYNX:no exudate, no erythema and lips, buccal mucosa, and tongue normal   NECK: supple, thyroid normal size, non-tender, without nodularity LYMPH:  no palpable lymphadenopathy in the cervical, axillary or inguinal LUNGS: clear to auscultation and percussion with normal breathing effort HEART: regular rate & rhythm and no murmurs and no lower extremity edema ABDOMEN:abdomen soft, non-tender and normal bowel sounds Musculoskeletal:no cyanosis of digits and no clubbing  NEURO: alert & oriented x 3 with fluent speech, no focal motor/sensory deficits  LABORATORY DATA:  I have reviewed the data as listed   Chemistry      Component Value Date/Time   NA 140 05/17/2014 0544   NA 143 05/12/2014 0905   K 4.1 05/17/2014 0544   K 4.1 05/12/2014 0905   CL 111 05/17/2014 0544   CO2 24 05/17/2014 0544   CO2 24 05/12/2014 0905   BUN 13 05/17/2014 0544   BUN 11.0 05/12/2014 0905   CREATININE 0.81 05/17/2014 0544   CREATININE 0.8 05/12/2014 0905      Component Value Date/Time   CALCIUM 8.3* 05/17/2014 0544   CALCIUM 9.0 05/12/2014 0905   ALKPHOS 63 05/12/2014 0905   AST 11 05/12/2014 0905   ALT <6 05/12/2014 0905   BILITOT 0.47 05/12/2014 0905       Lab Results  Component Value Date   WBC 4.1 05/17/2014   HGB 9.2* 05/17/2014   HCT 29.9* 05/17/2014   MCV 91.4 05/17/2014   PLT 323 05/17/2014   NEUTROABS 2.0  05/12/2014   ASSESSMENT & PLAN:  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: By ultrasound 2.3 cm 6:00 position, 2.5 cm at 9:00 position, 3.1 cm retroareolar, total size 11.5 cm by MRI extending to underneath the skin and the dermis with enlarged lymph nodes biopsy proven breast cancer clinical stage TIV, N1, M1 stage IV ER/PR positive HER-2 negative Ki-67 95% status post neoadjuvant chemotherapy with Taxotere and Cytoxan 6 cycles followed by right mastectomy: 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  Treatment plan: Adjuvant radiation therapy to the chest wall and axilla and consideration for radiation therapy to the left ninth rib. Patient will meet Dr. Valere Dross to make the final decision.  Return to clinic after radiation therapy to discuss starting antiestrogen therapy with aromatase inhibitor anastrozole 1 mg daily.  No orders of the defined types were placed in this encounter.   The patient has a good understanding of the overall plan. she agrees with it. She will call with any problems that may develop before her next visit here.   Rulon Eisenmenger, MD

## 2014-06-13 NOTE — Telephone Encounter (Signed)
Rad onc ref noted and pt will get a call from them with appt  anne

## 2014-06-13 NOTE — Addendum Note (Signed)
Addended by: Prentiss Bells on: 06/13/2014 03:44 PM   Modules accepted: Orders

## 2014-06-19 ENCOUNTER — Encounter: Payer: Self-pay | Admitting: Radiation Oncology

## 2014-06-19 NOTE — Progress Notes (Signed)
Location of Breast Cancer: Right  Breast 6 o'clock position, Lower outer quadrant  Histology per Pathology Report: invasive ductal carcinoma  Receptor Status: ER(+ 99% ), PR ( + 51%), Her2-neu(neg),  Ki-67 95%)  Did patient present with symptoms (if so, please note symptoms) or was this found on screening mammography?: pt noticed right breast discomfort and tingling 10/2013, the pain radiated to her axilla. Underwent mammography.  Past/Anticipated interventions by surgeon, if ZOX:[id number] 05/15/14: Breast, modified radical mastectomy , right - INVASIVE DUCTAL CARCINOMA INVOLVING AN AREA 11.5 CM IN GREATEST DIMENSION.- DUCTAL CARCINOMA IN SITU.- MARGINS NOT INVOLVED.- METASTATIC CARCINOMA IN 3 OF 4 LYMPH NODES (3/4).- SEE ONCOLOGY TABLE AND COMMENT. Dr. [name] Toth,III,MD Microscopic Comment BREAST, INVASIVE TUMOR, WITH LYMPH NODES PRESENT  Past/Anticipated interventions by medical oncology, if any: Chemotherapy :treated with neoadjuvant chemotherapy with Taxotere and Cytoxan 6 cycles. Last cycle 04/14/14. She underwent mastectomy on 05/15/2014 which revealed significant residual disease  Including 3 or 4 lymph nodes Positive for disease which showed lympho vascular   Dr. Geralyn Flash note 06/13/14: Treatment plan: Adjuvant radiation therapy to the chest wall and axilla and consideration for radiation to the 9th rib, To return to med onc clinic after radiation to start antiestrogen therapy with aromatase inhibitor anastrozole 23m daily, with follow up appt 08/15/14  Lymphedema issues, if any: none  Pain issues, if any:  No, does have toothache upper right x 2 days  SAFETY ISSUES:  Prior radiation?  NO  Pacemaker/ICD? NO  Possible current pregnancy? NO  Is the patient on methotrexate? NO  Current Complaints / other details:  Seen in Breast Clinic 12/07/13  She menarched at age of 89and went to menopause at age 59 She had 2 pregnancy, her first child was born at age 89 She did not received birth  control pills for approximately 0.  She was never exposed to fertility medications or hormone replacement therapy.  She has no family history of Breast/GYN/GI cancer   MRebecca Eaton RN 06/19/2014,1:20 PM

## 2014-06-20 ENCOUNTER — Encounter: Payer: Self-pay | Admitting: Radiation Oncology

## 2014-06-21 ENCOUNTER — Encounter: Payer: Self-pay | Admitting: Radiation Oncology

## 2014-06-21 ENCOUNTER — Ambulatory Visit
Admission: RE | Admit: 2014-06-21 | Discharge: 2014-06-21 | Disposition: A | Discharge status: Home / Self Care | Payer: 59 | Source: Ambulatory Visit | Attending: Radiation Oncology | Admitting: Radiation Oncology

## 2014-06-21 VITALS — BP 107/66 | HR 84 | Temp 98.0°F | Resp 20 | Ht 61.0 in | Wt 189.0 lb

## 2014-06-21 DIAGNOSIS — Z510 Encounter for antineoplastic radiation therapy: Secondary | ICD-10-CM | POA: Insufficient documentation

## 2014-06-21 DIAGNOSIS — C50511 Malignant neoplasm of 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: Secondary | ICD-10-CM | POA: Diagnosis not present

## 2014-06-21 DIAGNOSIS — L598 Other specified disorders of the skin and subcutaneous tissue related to radiation: Secondary | ICD-10-CM | POA: Insufficient documentation

## 2014-06-21 DIAGNOSIS — Z79899 Other long term (current) drug therapy: Secondary | ICD-10-CM | POA: Diagnosis not present

## 2014-06-21 DIAGNOSIS — C779 Secondary and unspecified malignant neoplasm of lymph node, unspecified: Secondary | ICD-10-CM | POA: Diagnosis not present

## 2014-06-21 DIAGNOSIS — C7951 Secondary malignant neoplasm of bone: Secondary | ICD-10-CM | POA: Diagnosis not present

## 2014-06-21 DIAGNOSIS — Z170 Estrogen receptor positive status [ER+]: Secondary | ICD-10-CM | POA: Diagnosis not present

## 2014-06-21 DIAGNOSIS — Z9011 Acquired absence of right breast and nipple: Secondary | ICD-10-CM | POA: Diagnosis not present

## 2014-06-21 HISTORY — DX: Malignant neoplasm of bone and articular cartilage, unspecified: C41.9

## 2014-06-21 HISTORY — DX: Personal history of antineoplastic chemotherapy: Z92.21

## 2014-06-21 HISTORY — DX: Malignant neoplasm of unspecified site of unspecified female breast: C50.919

## 2014-06-21 NOTE — Progress Notes (Signed)
CC: Dr. Iona Beard, Dr. Autumn Messing III, Dr. Nicholas Lose  Follow-up note:  Diagnosis: Clinical stage IV (T4b, N1, M1)/ Pathologic stage IV (ypT3, ypN1c)  invasive ductal carcinoma of the right breast  Karen Douglas is a pleasant 59 year old female who is seen today for review with consideration of post mastectomy radiation therapy in the management of her stage IV invasive ductal carcinoma of the right breast.  I first saw the patient at the [facility] on 12/07/2013. She states that she first noted right breast discomfort and tingling approximately 2 months ago. The pain radiated to her right "armpit". She underwent mammography at [facility] on 11/26/2013. She was noted to have an irregular mass with a spiculated margin of the right breast along with axillary adenopathy. Additional views and ultrasound showed a 2.3 cm lobulated mass in the right breast at 6:00, a 3.1 cm lobulated mass in the right breast central to the nipple, a 1.3 cm irregular mass in the right axillary tail and 2.5 cm mass in the right breast at 9:00 all suspicious for malignancy. Biopsies on 11/28/2013 were diagnostic for invasive mammary carcinoma. The carcinoma was ER positive, PR positive with a proliferation marker of 96%. The tumor was HER-2/neu negative. Breast MR on 12/06/2013 showed enhancing tumor masses throughout the right breast involving all 4 quadrants with diffuse skin thickening and dermal enhancement. At least 2 abnormal level I right axillary lymph nodes were seen consistent with metastatic adenopathy.  Her staging MRI scan on 12/20/2013 showed diffuse hypermetabolic activity within the right breast consistent with inflammatory carcinoma.  She also had metastatic adenopathy in the right axilla, mediastinum, and bilateral hilar regions.  She was felt to have small bilateral pulmonary metastasis in addition to a focal metastatic deposit to her left lateral ninth rib.  She also had nonspecific activity along the right pelvic pectineus  muscle which is felt to be nonspecific.  She will onto receive neoadjuvant chemotherapy with Dr. Lindi Adie.  Her follow-up PET scan on 04/26/2014 showed a positive response to chemotherapy with decrease in size and metabolic activity of the right breast mass with resolution of hypermetabolic right axilla lymphadenopathy and mild residual metabolic activity associated with the right breast skin thickening.  There was sclerosis of the left lateral ninth rib.  On 05/15/2014 she underwent a right modified radical mastectomy and she was found to have residual invasive ductal carcinoma involving an area of 11.5 cm along with DCIS.  Margins were negative.  Metastatic carcinoma was seen in 3 of 4 lymph nodes.  The tumor remained ER/PR positive and HER-2/neu negative with markedly elevated Ki-67 values.  There was fibrosis consistent with a moderate treatment response.  She is doing well postoperatively although she recently noted the development of a small erythematous nodule along the inferomedial right chest wall.  This is also noticed by Dr.Toth who saw her yesterday.  Physical examination: Alert and oriented. Filed Vitals:   06/21/14 0713  BP: 107/66  Pulse: 84  Temp: 98 F (36.7 C)  Resp: 20   Head and neck examination: Grossly unremarkable.  She was a scarf.  Nodes: There is no palpable cervical, supraclavicular, or axillary lymphadenopathy.  Chest: Right-sided mastectomy with a well-healed right mastectomy wound.  Just below the medial to the wound is a 1.0 x 0.8 cm erythematous nodule which has the appearance of a cutaneous recurrence.  (Photographs taken) There are no other suspicious nodules along the right chest wall.  Lungs clear.  Left breast without masses or lesions.  Abdomen without hepatomegaly.  Extremities: I believe there is trace right upper extremity lymphedema.   Impression:  Clinical stage IV (T4b, N1, M1)/ Pathologic stage IV (ypT3, ypN1c)  invasive ductal carcinoma of the right breast.  I  believe that she has recent development of a right chest wall cutaneous recurrence.  This could be excised, but I think this would delay necessary chest wall and regional node radiation.  I will assume that this nodule is recurrent disease, and monitor her progress closely during her radiotherapy.  Photographs were taken.  She is ready to begin radiation therapy and I will have her return by early next week for simulation/treatment planning.  I discussed the potential acute and late toxicities of radiation therapy, and she wishes to proceed as outlined.  Consent is signed today.  I do not intend to treat her left rib metastasis at this time.  Plan: As above.  40 minutes was spent face-to-face with the patient, primarily counseling the patient and coordinating her care.

## 2014-06-21 NOTE — Progress Notes (Signed)
Please see the Nurse Progress Note in the MD Initial Consult Encounter for this patient. 

## 2014-06-26 ENCOUNTER — Ambulatory Visit
Admission: RE | Admit: 2014-06-26 | Discharge: 2014-06-26 | Disposition: A | Discharge status: Home / Self Care | Payer: 59 | Source: Ambulatory Visit | Attending: Radiation Oncology | Admitting: Radiation Oncology

## 2014-06-26 DIAGNOSIS — C50511 Malignant neoplasm of 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: Secondary | ICD-10-CM

## 2014-06-26 DIAGNOSIS — Z510 Encounter for antineoplastic radiation therapy: Secondary | ICD-10-CM | POA: Diagnosis not present

## 2014-06-26 NOTE — Progress Notes (Signed)
Complex simulation/treatment planning note: The patient was taken to the CT simulator.  A Vac lock immobilization device was constructed, however she had limited right shoulder extension, and we could not reduce her right supraclavicular/neck skin fold.  Her right chest wall borders were marked with radiopaque wires along with her mastectomy scar.  I placed a BB on her suspicious chest wall nodule.  She was then scanned.  I placed an isocenter for a 4 field set up.  The data set was sent to the planning system where contoured her right chest wall nodule and reviewed contouring of her normal structures.  She was set up to medial and lateral right chest wall tangents.  2 separate multileaf collimators were designed to conform the field.  She was then set up to her right supraclavicular/axillary region, LAO and a separate multileaf, was designed.  Lastly she was set up to her right axilla with a PA field and a separate and unique multileaf collimator.  I prescribing 5040 cGy in 28 sessions to her right chest wall and regional lymph nodes.  She will then undergo a right chest wall/mastectomy scar boost for a further 1000 cGy in 5 sessions and perhaps a further reduced field boost to her right chest wall nodule.  She is now ready for 3-D simulation.

## 2014-06-29 ENCOUNTER — Encounter: Payer: Self-pay | Admitting: Radiation Oncology

## 2014-06-29 DIAGNOSIS — Z510 Encounter for antineoplastic radiation therapy: Secondary | ICD-10-CM | POA: Diagnosis not present

## 2014-06-29 NOTE — Progress Notes (Signed)
3-D simulation note: The patient completed 3-D simulation/treatment planning in the management of her carcinoma of the right breast.  Dose volume histograms were obtained for the target structures/chest wall along with avoidance structures including the heart and lungs.  We met our departmental guidelines.  She was set up to medial and lateral right chest wall tangents.  She had 3 separate and unique MLCs for each tangent with 6 complex treatment device charges and 8 dosimetry charges for her right chest wall for bolus and non-bolus.  She was also set up to her right supraclavicular region, LAO with a separate multileaf collimator and lastly to her PA right axilla with another unique [facility] for a total of 8 complex treatment devices.  I am prescribing 5040 cGy to the right chest wall and regional lymph nodes utilizing 6 MV and 10 MV photons for her supraclavicular, posterior axillary field and 6 MV photons to her right chest wall.

## 2014-07-03 ENCOUNTER — Ambulatory Visit
Admission: RE | Admit: 2014-07-03 | Discharge: 2014-07-03 | Disposition: A | Discharge status: Home / Self Care | Payer: 59 | Source: Ambulatory Visit | Attending: Radiation Oncology | Admitting: Radiation Oncology

## 2014-07-03 ENCOUNTER — Ambulatory Visit: Payer: 59 | Admitting: Radiation Oncology

## 2014-07-03 ENCOUNTER — Other Ambulatory Visit: Payer: Self-pay | Admitting: Radiation Oncology

## 2014-07-03 DIAGNOSIS — Z510 Encounter for antineoplastic radiation therapy: Secondary | ICD-10-CM | POA: Diagnosis not present

## 2014-07-04 ENCOUNTER — Ambulatory Visit
Admission: RE | Admit: 2014-07-04 | Discharge: 2014-07-04 | Disposition: A | Discharge status: Home / Self Care | Payer: 59 | Source: Ambulatory Visit | Attending: Radiation Oncology | Admitting: Radiation Oncology

## 2014-07-04 DIAGNOSIS — Z510 Encounter for antineoplastic radiation therapy: Secondary | ICD-10-CM | POA: Diagnosis not present

## 2014-07-05 ENCOUNTER — Ambulatory Visit: Payer: 59

## 2014-07-05 ENCOUNTER — Ambulatory Visit
Admission: RE | Admit: 2014-07-05 | Discharge: 2014-07-05 | Disposition: A | Discharge status: Home / Self Care | Payer: 59 | Source: Ambulatory Visit | Attending: Radiation Oncology | Admitting: Radiation Oncology

## 2014-07-05 DIAGNOSIS — Z510 Encounter for antineoplastic radiation therapy: Secondary | ICD-10-CM | POA: Diagnosis not present

## 2014-07-06 ENCOUNTER — Ambulatory Visit: Payer: 59

## 2014-07-06 ENCOUNTER — Encounter (HOSPITAL_COMMUNITY): Payer: Self-pay

## 2014-07-06 ENCOUNTER — Ambulatory Visit
Admission: RE | Admit: 2014-07-06 | Discharge: 2014-07-06 | Disposition: A | Discharge status: Home / Self Care | Payer: 59 | Source: Ambulatory Visit | Attending: Radiation Oncology | Admitting: Radiation Oncology

## 2014-07-06 DIAGNOSIS — Z510 Encounter for antineoplastic radiation therapy: Secondary | ICD-10-CM | POA: Diagnosis not present

## 2014-07-07 ENCOUNTER — Ambulatory Visit: Payer: 59

## 2014-07-07 ENCOUNTER — Ambulatory Visit
Admission: RE | Admit: 2014-07-07 | Discharge: 2014-07-07 | Disposition: A | Discharge status: Home / Self Care | Payer: 59 | Source: Ambulatory Visit | Attending: Radiation Oncology | Admitting: Radiation Oncology

## 2014-07-07 DIAGNOSIS — Z510 Encounter for antineoplastic radiation therapy: Secondary | ICD-10-CM | POA: Diagnosis not present

## 2014-07-10 ENCOUNTER — Ambulatory Visit
Admission: RE | Admit: 2014-07-10 | Discharge: 2014-07-10 | Disposition: A | Discharge status: Home / Self Care | Payer: 59 | Source: Ambulatory Visit | Attending: Radiation Oncology | Admitting: Radiation Oncology

## 2014-07-10 ENCOUNTER — Ambulatory Visit: Payer: 59

## 2014-07-10 ENCOUNTER — Encounter: Payer: Self-pay | Admitting: Radiation Oncology

## 2014-07-10 VITALS — BP 118/73 | HR 84 | Temp 97.7°F | Ht 61.0 in | Wt 184.5 lb

## 2014-07-10 DIAGNOSIS — C50511 Malignant neoplasm of 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: Secondary | ICD-10-CM | POA: Diagnosis not present

## 2014-07-10 DIAGNOSIS — Z510 Encounter for antineoplastic radiation therapy: Secondary | ICD-10-CM | POA: Diagnosis not present

## 2014-07-10 MED ORDER — ALRA NON-METALLIC DEODORANT (RAD-ONC)
1.0000 "application " | Freq: Once | TOPICAL | Status: AC
  Administered 2014-07-10: 1 via TOPICAL

## 2014-07-10 MED ORDER — RADIAPLEXRX EX GEL
Freq: Once | CUTANEOUS | Status: AC
  Administered 2014-07-10: 13:00:00 via TOPICAL

## 2014-07-10 NOTE — Progress Notes (Signed)
Weekly Management Note:  Site: Right chest wall/regional lymph nodes Current Dose:  900  cGy Projected Dose: 5040  cGy followed by right chest wall and right chest wall nodule boost  Narrative: The patient is seen today for routine under treatment assessment. CBCT/MVCT images/port films were reviewed. The chart was reviewed.   She is without new complaints today.  She feels that there has been some enlargement of her right chest wall nodule.  Physical Examination:  Filed Vitals:   07/10/14 1009  BP: 118/73  Pulse: 84  Temp: 97.7 F (36.5 C)  .  Weight: 184 lb 8 oz (83.689 kg).  There is faint hyperpigmentation along the right chest wall/axilla/clavicular region.  The previously noted chest wall nodule now measures 1.0 cm and is slightly enlarged.  Impression: Tolerating radiation therapy well, slight enlargement of the chest wall nodule which I expect his shrink over the next week.  This is being bolused every other day, and should be receiving a significant dose.  Plan: Continue radiation therapy as planned.

## 2014-07-10 NOTE — Progress Notes (Signed)
Education today regarding management of side-effects for treatment to the right chest wall: Pain, skin changes inclusive of redness, tanning, dry/moist desquamation, and fatigue. Given Radiaplex Gel with instructions to only apply after daily treatment, 2-3/day since she receives am treatment and she stated understanding.  Given Alra deodorant with instructions to perform a test patch in the same area x 3-4 days to ensure tolerance of the skin and she stated understanding.  Gvein the Radiation therapy and You booklet with the appropriate pages marked.  Accompanied by her spouse.

## 2014-07-10 NOTE — Progress Notes (Signed)
Karen Douglas has received 5 fractions to her right chest wall.  She denies any pain in the treatment field and denies fatigue.  Note tanning and dryness of her right chest wall, but skin remains intact.  She is concerned about the firm, blister like area on the right chest wall which she reports has enlarged in size..   Education today.

## 2014-07-10 NOTE — Addendum Note (Signed)
Encounter addended by: Benn Moulder, RN on: 07/10/2014  1:06 PM<BR>     Documentation filed: Inpatient MAR

## 2014-07-10 NOTE — Addendum Note (Signed)
Encounter addended by: Benn Moulder, RN on: 07/10/2014 12:05 PM<BR>     Documentation filed: Inpatient Patient Education, Dx Association, Notes Section, Orders

## 2014-07-11 ENCOUNTER — Ambulatory Visit: Payer: 59

## 2014-07-11 ENCOUNTER — Ambulatory Visit
Admission: RE | Admit: 2014-07-11 | Discharge: 2014-07-11 | Disposition: A | Discharge status: Home / Self Care | Payer: 59 | Source: Ambulatory Visit | Attending: Radiation Oncology | Admitting: Radiation Oncology

## 2014-07-11 DIAGNOSIS — Z510 Encounter for antineoplastic radiation therapy: Secondary | ICD-10-CM | POA: Diagnosis not present

## 2014-07-12 ENCOUNTER — Telehealth: Payer: Self-pay | Admitting: Oncology

## 2014-07-12 ENCOUNTER — Ambulatory Visit
Admission: RE | Admit: 2014-07-12 | Discharge: 2014-07-12 | Disposition: A | Discharge status: Home / Self Care | Payer: 59 | Source: Ambulatory Visit | Attending: Radiation Oncology | Admitting: Radiation Oncology

## 2014-07-12 ENCOUNTER — Ambulatory Visit: Payer: 59

## 2014-07-12 ENCOUNTER — Encounter: Payer: Self-pay | Admitting: Radiation Oncology

## 2014-07-12 DIAGNOSIS — Z510 Encounter for antineoplastic radiation therapy: Secondary | ICD-10-CM | POA: Diagnosis not present

## 2014-07-12 NOTE — Telephone Encounter (Signed)
Karen Douglas called and said Dr. Valere Dross had released her yesterday to go back to work.  She said she now needs a letter from Dr. Valere Dross saying she is OK to return to work.  She would like to pick up tomorrow when she comes in for radiation.  She can be reached at [phone].

## 2014-07-13 ENCOUNTER — Ambulatory Visit: Payer: 59

## 2014-07-13 ENCOUNTER — Ambulatory Visit
Admission: RE | Admit: 2014-07-13 | Discharge: 2014-07-13 | Disposition: A | Discharge status: Home / Self Care | Payer: 59 | Source: Ambulatory Visit | Attending: Radiation Oncology | Admitting: Radiation Oncology

## 2014-07-13 ENCOUNTER — Telehealth: Payer: Self-pay | Admitting: *Deleted

## 2014-07-13 DIAGNOSIS — Z510 Encounter for antineoplastic radiation therapy: Secondary | ICD-10-CM | POA: Diagnosis not present

## 2014-07-13 NOTE — Telephone Encounter (Signed)
CALLED PATIENT TO INFORM THAT LETTER IS READY FOR PICK-UP, SPOKE WITH MS. Pohl AND SHE WILL PICK IT UP ON 07-14-14.

## 2014-07-14 ENCOUNTER — Ambulatory Visit: Payer: 59

## 2014-07-14 ENCOUNTER — Ambulatory Visit
Admission: RE | Admit: 2014-07-14 | Discharge: 2014-07-14 | Disposition: A | Discharge status: Home / Self Care | Payer: 59 | Source: Ambulatory Visit | Attending: Radiation Oncology | Admitting: Radiation Oncology

## 2014-07-14 DIAGNOSIS — Z510 Encounter for antineoplastic radiation therapy: Secondary | ICD-10-CM | POA: Diagnosis not present

## 2014-07-17 ENCOUNTER — Ambulatory Visit
Admission: RE | Admit: 2014-07-17 | Discharge: 2014-07-17 | Disposition: A | Discharge status: Home / Self Care | Payer: 59 | Source: Ambulatory Visit | Attending: Radiation Oncology | Admitting: Radiation Oncology

## 2014-07-17 ENCOUNTER — Ambulatory Visit: Payer: 59

## 2014-07-17 DIAGNOSIS — C50511 Malignant neoplasm of 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: Secondary | ICD-10-CM

## 2014-07-17 DIAGNOSIS — Z510 Encounter for antineoplastic radiation therapy: Secondary | ICD-10-CM | POA: Diagnosis not present

## 2014-07-17 NOTE — Progress Notes (Signed)
Ms. Sporrer has received 10 fractions to her right chest wall.  She denies any pain, nor fatigue.  Note hyperpigmentation in the tx field. Skin remains intact.

## 2014-07-17 NOTE — Progress Notes (Signed)
Weekly Management Note:  Site: Right chest wall/regional lymph nodes Current Dose:  1800  cGy Projected Dose: 5040  cGy followed by chest wall boost  Narrative: The patient is seen today for routine under treatment assessment. CBCT/MVCT images/port films were reviewed. The chart was reviewed.   She is without new complaints today.  She tells me that she continues to have intermittent left neck discomfort which is at times achy in an other times sharp lasting no more than 5-6 minutes at a time.  This was brought to the attention of Dr. Lindi Adie and she is being followed.  Her PET scan from January was without evidence for metastatic disease to her cervical spine.  There is no radiation of pain.  She uses Radioplex gel along her right chest wall.  Physical Examination:  Wt Readings from Last 3 Encounters:  06/13/14 187 lb 11.2 oz (85.14 kg)  05/15/14 195 lb (88.451 kg)  05/12/14 195 lb (88.451 kg)   Temp Readings from Last 3 Encounters:  06/13/14 97.9 F (36.6 C) Oral  05/17/14 98.9 F (37.2 C) Oral  05/12/14 97.8 F (36.6 C) Oral   BP Readings from Last 3 Encounters:  06/13/14 108/69  05/17/14 125/77  05/12/14 105/72   Pulse Readings from Last 3 Encounters:  06/13/14 84  05/17/14 81  05/12/14 84    There is mild hyperpigmentation the skin along the right chest wall.  The tumor nodule inferior to her mastectomy scar appears to have regressed slightly since last week.  There certainly has been no further growth or new nodularity.  No areas of desquamation.  Impression: Tolerating radiation therapy well.  I'm not sure what to make out of her left neck discomfort.  Most importantly, her discomfort is intermittent and not constant.  In addition, her PET scan was negative along the cervical spine.  I think we can follow her for a short while longer.  Plan: Continue radiation therapy as planned.

## 2014-07-18 ENCOUNTER — Ambulatory Visit: Payer: 59

## 2014-07-19 ENCOUNTER — Ambulatory Visit
Admission: RE | Admit: 2014-07-19 | Discharge: 2014-07-19 | Disposition: A | Discharge status: Home / Self Care | Payer: 59 | Source: Ambulatory Visit | Attending: Radiation Oncology | Admitting: Radiation Oncology

## 2014-07-19 ENCOUNTER — Ambulatory Visit: Payer: 59

## 2014-07-19 DIAGNOSIS — Z510 Encounter for antineoplastic radiation therapy: Secondary | ICD-10-CM | POA: Diagnosis not present

## 2014-07-20 ENCOUNTER — Ambulatory Visit
Admission: RE | Admit: 2014-07-20 | Discharge: 2014-07-20 | Disposition: A | Discharge status: Home / Self Care | Payer: 59 | Source: Ambulatory Visit | Attending: Radiation Oncology | Admitting: Radiation Oncology

## 2014-07-20 ENCOUNTER — Ambulatory Visit: Payer: 59

## 2014-07-20 DIAGNOSIS — Z510 Encounter for antineoplastic radiation therapy: Secondary | ICD-10-CM | POA: Diagnosis not present

## 2014-07-21 ENCOUNTER — Ambulatory Visit
Admission: RE | Admit: 2014-07-21 | Discharge: 2014-07-21 | Disposition: A | Discharge status: Home / Self Care | Payer: 59 | Source: Ambulatory Visit | Attending: Radiation Oncology | Admitting: Radiation Oncology

## 2014-07-21 ENCOUNTER — Ambulatory Visit: Payer: 59

## 2014-07-21 DIAGNOSIS — Z510 Encounter for antineoplastic radiation therapy: Secondary | ICD-10-CM | POA: Diagnosis not present

## 2014-07-24 ENCOUNTER — Ambulatory Visit: Payer: 59

## 2014-07-24 ENCOUNTER — Ambulatory Visit
Admission: RE | Admit: 2014-07-24 | Discharge: 2014-07-24 | Disposition: A | Discharge status: Home / Self Care | Payer: 59 | Source: Ambulatory Visit | Attending: Radiation Oncology | Admitting: Radiation Oncology

## 2014-07-24 ENCOUNTER — Encounter: Payer: Self-pay | Admitting: Radiation Oncology

## 2014-07-24 VITALS — BP 125/71 | HR 75 | Temp 97.9°F | Resp 12 | Wt 187.1 lb

## 2014-07-24 DIAGNOSIS — C50511 Malignant neoplasm of 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: Secondary | ICD-10-CM

## 2014-07-24 DIAGNOSIS — Z510 Encounter for antineoplastic radiation therapy: Secondary | ICD-10-CM | POA: Diagnosis not present

## 2014-07-24 NOTE — Progress Notes (Signed)
She is currently in no pain. Pt right breast- positive for Hyperpigmentation and breast tenderness.  Pt denies edema. Pt continues to apply Radiaplex as directed. BP 125/71 mmHg  Pulse 75  Temp(Src) 97.9 F (36.6 C) (Oral)  Resp 12  Wt 187 lb 1.6 oz (84.868 kg)

## 2014-07-24 NOTE — Progress Notes (Signed)
Weekly Management Note:  Site: Right chest wall/regional lymph nodes Current Dose:  2340  cGy Projected Dose: 5040  CGy followed by right chest wall boost  Narrative: The patient is seen today for routine under treatment assessment. CBCT/MVCT images/port films were reviewed. The chart was reviewed.   She is without complaints today.  She uses Radioplex gel.  Physical Examination:  Filed Vitals:   07/24/14 0848  BP: 125/71  Pulse: 75  Temp: 97.9 F (36.6 C)  Resp: 12  .  Weight: 187 lb 1.6 oz (84.868 kg).  The previously noted recurrent right chest wall nodule has regressed and is flat.  No areas of desquamation.  Impression: Tolerating radiation therapy well.  Plan: Continue radiation therapy as planned.

## 2014-07-25 ENCOUNTER — Ambulatory Visit
Admission: RE | Admit: 2014-07-25 | Discharge: 2014-07-25 | Disposition: A | Discharge status: Home / Self Care | Payer: 59 | Source: Ambulatory Visit | Attending: Radiation Oncology | Admitting: Radiation Oncology

## 2014-07-25 ENCOUNTER — Ambulatory Visit: Payer: 59

## 2014-07-25 DIAGNOSIS — Z510 Encounter for antineoplastic radiation therapy: Secondary | ICD-10-CM | POA: Diagnosis not present

## 2014-07-26 ENCOUNTER — Ambulatory Visit: Payer: 59

## 2014-07-26 ENCOUNTER — Ambulatory Visit
Admission: RE | Admit: 2014-07-26 | Discharge: 2014-07-26 | Disposition: A | Discharge status: Home / Self Care | Payer: 59 | Source: Ambulatory Visit | Attending: Radiation Oncology | Admitting: Radiation Oncology

## 2014-07-26 DIAGNOSIS — Z510 Encounter for antineoplastic radiation therapy: Secondary | ICD-10-CM | POA: Diagnosis not present

## 2014-07-27 ENCOUNTER — Ambulatory Visit
Admission: RE | Admit: 2014-07-27 | Discharge: 2014-07-27 | Disposition: A | Discharge status: Home / Self Care | Payer: 59 | Source: Ambulatory Visit | Attending: Radiation Oncology | Admitting: Radiation Oncology

## 2014-07-27 ENCOUNTER — Ambulatory Visit: Payer: 59

## 2014-07-27 DIAGNOSIS — Z510 Encounter for antineoplastic radiation therapy: Secondary | ICD-10-CM | POA: Diagnosis not present

## 2014-07-28 ENCOUNTER — Ambulatory Visit
Admission: RE | Admit: 2014-07-28 | Discharge: 2014-07-28 | Disposition: A | Discharge status: Home / Self Care | Payer: 59 | Source: Ambulatory Visit | Attending: Radiation Oncology | Admitting: Radiation Oncology

## 2014-07-28 ENCOUNTER — Ambulatory Visit: Payer: 59

## 2014-07-28 DIAGNOSIS — Z510 Encounter for antineoplastic radiation therapy: Secondary | ICD-10-CM | POA: Diagnosis not present

## 2014-07-31 ENCOUNTER — Encounter: Payer: Self-pay | Admitting: Radiation Oncology

## 2014-07-31 ENCOUNTER — Ambulatory Visit
Admission: RE | Admit: 2014-07-31 | Discharge: 2014-07-31 | Disposition: A | Discharge status: Home / Self Care | Payer: 59 | Source: Ambulatory Visit | Attending: Radiation Oncology | Admitting: Radiation Oncology

## 2014-07-31 ENCOUNTER — Ambulatory Visit: Payer: 59

## 2014-07-31 VITALS — BP 104/75 | HR 72 | Temp 97.9°F | Resp 20 | Wt 184.7 lb

## 2014-07-31 DIAGNOSIS — C50511 Malignant neoplasm of 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: Secondary | ICD-10-CM

## 2014-07-31 DIAGNOSIS — Z510 Encounter for antineoplastic radiation therapy: Secondary | ICD-10-CM | POA: Diagnosis not present

## 2014-07-31 NOTE — Progress Notes (Signed)
weekly rad txs  Right cw/axilla, 19 completed, hyperpigmentaion, skin intact, radiaplex bid, some stinging on treated area, gave another radiaplex gel  Per request, mild fatigue, appetite fair 9:09 AM

## 2014-07-31 NOTE — Progress Notes (Signed)
Weekly Management Note:  Site: Right chest wall/regional lymph nodes Current Dose:  3420  cGy Projected Dose: 5040  cGy  Narrative: The patient is seen today for routine under treatment assessment. CBCT/MVCT images/port films were reviewed. The chart was reviewed.   She is without new complaints today.  She uses Radioplex gel.  She still has left neck discomfort which is as bad as 4/10.  Physical Examination:  Filed Vitals:   07/31/14 0909  BP: 104/75  Pulse: 72  Temp: 97.9 F (36.6 C)  Resp: 20  .  Weight: 184 lb 11.2 oz (83.779 kg).  The previously noted inferior right chest wall recurrent nodule is no longer visible.  There is hyperpigmentation the skin with no areas of desquamation.  Impression: Tolerating radiation therapy well.  Plan: Continue radiation therapy as planned.

## 2014-08-01 ENCOUNTER — Ambulatory Visit
Admission: RE | Admit: 2014-08-01 | Discharge: 2014-08-01 | Disposition: A | Discharge status: Home / Self Care | Payer: 59 | Source: Ambulatory Visit | Attending: Radiation Oncology | Admitting: Radiation Oncology

## 2014-08-01 ENCOUNTER — Ambulatory Visit: Payer: 59

## 2014-08-01 DIAGNOSIS — Z510 Encounter for antineoplastic radiation therapy: Secondary | ICD-10-CM | POA: Diagnosis not present

## 2014-08-01 NOTE — Addendum Note (Signed)
Encounter addended by: Benn Moulder, RN on: 08/01/2014  4:41 PM<BR>     Documentation filed: Inpatient Patient Education

## 2014-08-02 ENCOUNTER — Ambulatory Visit: Payer: 59

## 2014-08-02 ENCOUNTER — Ambulatory Visit
Admission: RE | Admit: 2014-08-02 | Discharge: 2014-08-02 | Disposition: A | Discharge status: Home / Self Care | Payer: 59 | Source: Ambulatory Visit | Attending: Radiation Oncology | Admitting: Radiation Oncology

## 2014-08-02 DIAGNOSIS — Z510 Encounter for antineoplastic radiation therapy: Secondary | ICD-10-CM | POA: Diagnosis not present

## 2014-08-03 ENCOUNTER — Ambulatory Visit: Payer: 59

## 2014-08-03 ENCOUNTER — Ambulatory Visit
Admission: RE | Admit: 2014-08-03 | Discharge: 2014-08-03 | Disposition: A | Discharge status: Home / Self Care | Payer: 59 | Source: Ambulatory Visit | Attending: Radiation Oncology | Admitting: Radiation Oncology

## 2014-08-03 DIAGNOSIS — Z510 Encounter for antineoplastic radiation therapy: Secondary | ICD-10-CM | POA: Diagnosis not present

## 2014-08-04 ENCOUNTER — Ambulatory Visit
Admission: RE | Admit: 2014-08-04 | Discharge: 2014-08-04 | Disposition: A | Discharge status: Home / Self Care | Payer: 59 | Source: Ambulatory Visit | Attending: Radiation Oncology | Admitting: Radiation Oncology

## 2014-08-04 ENCOUNTER — Ambulatory Visit: Payer: 59

## 2014-08-04 DIAGNOSIS — Z510 Encounter for antineoplastic radiation therapy: Secondary | ICD-10-CM | POA: Diagnosis not present

## 2014-08-07 ENCOUNTER — Ambulatory Visit: Payer: 59

## 2014-08-07 ENCOUNTER — Ambulatory Visit
Admission: RE | Admit: 2014-08-07 | Discharge: 2014-08-07 | Disposition: A | Discharge status: Home / Self Care | Payer: 59 | Source: Ambulatory Visit | Attending: Radiation Oncology | Admitting: Radiation Oncology

## 2014-08-07 ENCOUNTER — Encounter: Payer: Self-pay | Admitting: Radiation Oncology

## 2014-08-07 VITALS — BP 113/67 | HR 71 | Temp 97.7°F | Resp 16 | Ht 61.0 in | Wt 183.9 lb

## 2014-08-07 DIAGNOSIS — C50511 Malignant neoplasm of 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: Secondary | ICD-10-CM

## 2014-08-07 DIAGNOSIS — Z9221 Personal history of antineoplastic chemotherapy: Secondary | ICD-10-CM | POA: Insufficient documentation

## 2014-08-07 DIAGNOSIS — Z9011 Acquired absence of right breast and nipple: Secondary | ICD-10-CM | POA: Diagnosis not present

## 2014-08-07 DIAGNOSIS — L599 Disorder of the skin and subcutaneous tissue related to radiation, unspecified: Secondary | ICD-10-CM | POA: Diagnosis not present

## 2014-08-07 DIAGNOSIS — Z170 Estrogen receptor positive status [ER+]: Secondary | ICD-10-CM | POA: Diagnosis not present

## 2014-08-07 DIAGNOSIS — Z510 Encounter for antineoplastic radiation therapy: Secondary | ICD-10-CM | POA: Insufficient documentation

## 2014-08-07 MED ORDER — RADIAPLEXRX EX GEL
Freq: Once | CUTANEOUS | Status: AC
  Administered 2014-08-07: 09:00:00 via TOPICAL

## 2014-08-07 NOTE — Progress Notes (Signed)
Karen Douglas has completed 24 fractions to her right chest wall/axilla/subclavian area.  She reports burning pain at a 3/10 on her right chest wall and subclavian area due to skin irritation.  She denies fatigue.  The skin on her right subclavian area has hyperpigmentation with small peeling areas.  Her right chest wall also has hyperpigmentation with small peeling areas.  She is using radiaplex.  She has requested a refill and another tube has been given.  BP 113/67 mmHg  Pulse 71  Temp(Src) 97.7 F (36.5 C) (Oral)  Resp 16  Ht 5\' 1"  (1.549 m)  Wt 183 lb 14.4 oz (83.416 kg)  BMI 34.77 kg/m2

## 2014-08-07 NOTE — Progress Notes (Signed)
Weekly Management Note:  Site: Right chest wall and regional lymph nodes Current Dose:  4320  cGy Projected Dose: 5040  cGy  Narrative: The patient is seen today for routine under treatment assessment. CBCT/MVCT images/port films were reviewed. The chart was reviewed.   She is without complaints today except for slight burning discomfort along her right chest wall shows underwent simulation earlier today for her right chest wall electron beam boost.  She continues with her Radioplex gel.  Physical Examination: There were no vitals filed for this visit..  Weight:  .  There is hyperpigmentation the skin and patchy dry desquamation along the right chest wall.  The previously noted recurrent left chest wall nodule inferior to her right mastectomy scar is no longer present.  Impression: Tolerating radiation therapy well.  Plan: Continue radiation therapy as planned.

## 2014-08-08 ENCOUNTER — Ambulatory Visit: Payer: 59

## 2014-08-08 ENCOUNTER — Ambulatory Visit
Admission: RE | Admit: 2014-08-08 | Discharge: 2014-08-08 | Disposition: A | Discharge status: Home / Self Care | Payer: 59 | Source: Ambulatory Visit | Attending: Radiation Oncology | Admitting: Radiation Oncology

## 2014-08-08 DIAGNOSIS — Z510 Encounter for antineoplastic radiation therapy: Secondary | ICD-10-CM | POA: Diagnosis not present

## 2014-08-09 ENCOUNTER — Ambulatory Visit: Payer: 59

## 2014-08-09 ENCOUNTER — Encounter: Payer: Self-pay | Admitting: Radiation Oncology

## 2014-08-09 ENCOUNTER — Ambulatory Visit
Admission: RE | Admit: 2014-08-09 | Discharge: 2014-08-09 | Disposition: A | Discharge status: Home / Self Care | Payer: 59 | Source: Ambulatory Visit | Attending: Radiation Oncology | Admitting: Radiation Oncology

## 2014-08-09 DIAGNOSIS — Z510 Encounter for antineoplastic radiation therapy: Secondary | ICD-10-CM | POA: Diagnosis not present

## 2014-08-09 NOTE — Progress Notes (Signed)
Electron beam simulation note: The patient completed her electron beam planning for her right chest wall boost.  She was set up en face.  One custom block was constructed to conform the field.  A special port plan was requested and reviewed.  0.8 cm bolus will be applied to the skin on the first day of her treatment.  I'm prescribing 1000 cGy in 5 sessions utilizing 6 MEV electrons.

## 2014-08-10 ENCOUNTER — Ambulatory Visit: Payer: 59

## 2014-08-10 ENCOUNTER — Ambulatory Visit: Payer: 59 | Admitting: Radiation Oncology

## 2014-08-10 ENCOUNTER — Ambulatory Visit
Admission: RE | Admit: 2014-08-10 | Discharge: 2014-08-10 | Disposition: A | Discharge status: Home / Self Care | Payer: 59 | Source: Ambulatory Visit | Attending: Radiation Oncology | Admitting: Radiation Oncology

## 2014-08-10 DIAGNOSIS — Z510 Encounter for antineoplastic radiation therapy: Secondary | ICD-10-CM | POA: Diagnosis not present

## 2014-08-11 ENCOUNTER — Ambulatory Visit: Payer: 59

## 2014-08-11 ENCOUNTER — Ambulatory Visit
Admission: RE | Admit: 2014-08-11 | Discharge: 2014-08-11 | Disposition: A | Discharge status: Home / Self Care | Payer: 59 | Source: Ambulatory Visit | Attending: Radiation Oncology | Admitting: Radiation Oncology

## 2014-08-11 DIAGNOSIS — Z510 Encounter for antineoplastic radiation therapy: Secondary | ICD-10-CM | POA: Diagnosis not present

## 2014-08-14 ENCOUNTER — Ambulatory Visit
Admission: RE | Admit: 2014-08-14 | Discharge: 2014-08-14 | Disposition: A | Discharge status: Home / Self Care | Payer: 59 | Source: Ambulatory Visit | Attending: Radiation Oncology | Admitting: Radiation Oncology

## 2014-08-14 ENCOUNTER — Ambulatory Visit: Admission: RE | Admit: 2014-08-14 | Payer: 59 | Source: Ambulatory Visit | Admitting: Radiation Oncology

## 2014-08-14 ENCOUNTER — Encounter: Payer: Self-pay | Admitting: Radiation Oncology

## 2014-08-14 ENCOUNTER — Ambulatory Visit: Payer: 59

## 2014-08-14 VITALS — BP 110/82 | HR 76 | Temp 97.9°F | Ht 61.0 in | Wt 182.9 lb

## 2014-08-14 DIAGNOSIS — C50511 Malignant neoplasm of 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: Secondary | ICD-10-CM

## 2014-08-14 DIAGNOSIS — Z510 Encounter for antineoplastic radiation therapy: Secondary | ICD-10-CM | POA: Diagnosis not present

## 2014-08-14 NOTE — Progress Notes (Signed)
Karen Douglas has received 29 fractions to her right chest wall.  Note dry desquamation in the right, inner [place] region, hyperpigmentation of the [place] field inclusive of the right, upper shoulder blade.  She reports tenderness at a level 6/10 and has intermittent shooting pains.  Using Radiaplex Gel and given a tube of Biafine today.  Fatigue noted today.

## 2014-08-14 NOTE — Progress Notes (Signed)
Weekly Management Note:  Site: Right chest wall boost Current Dose:  200  cGy Projected Dose: 1600  cGy  Narrative: The patient is seen today for routine under treatment assessment. CBCT/MVCT images/port films were reviewed. The chart was reviewed.   She is without new complaints today.  She uses Biafine cream.  Physical Examination:  Filed Vitals:   08/14/14 0912  BP: 110/82  Pulse: 76  Temp: 97.9 F (36.6 C)  .  Weight: 182 lb 14.4 oz (82.963 kg).  There is marked hyperpigmentation the skin along the right chest wall, supraclavicular region and axilla with focal areas of dry desquamation and hyperpigmentation.  The previously noted right chest wall nodule is no longer seen.  Impression: Tolerating radiation therapy well.  Plan: Continue radiation therapy as planned.

## 2014-08-15 ENCOUNTER — Ambulatory Visit (HOSPITAL_BASED_OUTPATIENT_CLINIC_OR_DEPARTMENT_OTHER): Payer: 59

## 2014-08-15 ENCOUNTER — Ambulatory Visit: Payer: 59

## 2014-08-15 ENCOUNTER — Ambulatory Visit
Admission: RE | Admit: 2014-08-15 | Discharge: 2014-08-15 | Disposition: A | Discharge status: Home / Self Care | Payer: 59 | Source: Ambulatory Visit | Attending: Radiation Oncology | Admitting: Radiation Oncology

## 2014-08-15 ENCOUNTER — Telehealth: Payer: Self-pay | Admitting: Hematology and Oncology

## 2014-08-15 ENCOUNTER — Ambulatory Visit: Payer: 59 | Admitting: Radiation Oncology

## 2014-08-15 ENCOUNTER — Ambulatory Visit (HOSPITAL_BASED_OUTPATIENT_CLINIC_OR_DEPARTMENT_OTHER): Payer: 59 | Admitting: Hematology and Oncology

## 2014-08-15 VITALS — BP 114/74 | HR 82 | Temp 97.5°F | Resp 18 | Ht 61.0 in | Wt 182.0 lb

## 2014-08-15 DIAGNOSIS — C7801 Secondary malignant neoplasm of right lung: Secondary | ICD-10-CM

## 2014-08-15 DIAGNOSIS — C7802 Secondary malignant neoplasm of left lung: Secondary | ICD-10-CM

## 2014-08-15 DIAGNOSIS — Z510 Encounter for antineoplastic radiation therapy: Secondary | ICD-10-CM | POA: Diagnosis not present

## 2014-08-15 DIAGNOSIS — C50511 Malignant neoplasm of 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: Secondary | ICD-10-CM

## 2014-08-15 DIAGNOSIS — Z170 Estrogen receptor positive status [ER+]: Secondary | ICD-10-CM

## 2014-08-15 DIAGNOSIS — C773 Secondary and unspecified malignant neoplasm of axilla and upper limb lymph nodes: Secondary | ICD-10-CM

## 2014-08-15 LAB — CBC WITH DIFFERENTIAL/PLATELET
BASO%: 0.7 % (ref 0.0–2.0)
Basophils Absolute: 0 10*3/uL (ref 0.0–0.1)
EOS ABS: 0.1 10*3/uL (ref 0.0–0.5)
EOS%: 2.2 % (ref 0.0–7.0)
HCT: 36.3 % (ref 34.8–46.6)
HGB: 11.6 g/dL (ref 11.6–15.9)
LYMPH%: 12.4 % — ABNORMAL LOW (ref 14.0–49.7)
MCH: 26.4 pg (ref 25.1–34.0)
MCHC: 32 g/dL (ref 31.5–36.0)
MCV: 82.5 fL (ref 79.5–101.0)
MONO#: 0.4 10*3/uL (ref 0.1–0.9)
MONO%: 14.5 % — ABNORMAL HIGH (ref 0.0–14.0)
NEUT%: 70.2 % (ref 38.4–76.8)
NEUTROS ABS: 2.1 10*3/uL (ref 1.5–6.5)
Platelets: 252 10*3/uL (ref 145–400)
RBC: 4.4 10*6/uL (ref 3.70–5.45)
RDW: 19.3 % — AB (ref 11.2–14.5)
WBC: 3 10*3/uL — AB (ref 3.9–10.3)
lymph#: 0.4 10*3/uL — ABNORMAL LOW (ref 0.9–3.3)

## 2014-08-15 NOTE — Assessment & Plan Note (Signed)
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  Anti-estrogen therapy with anastrozole 1 mg daily after the conclusion of radiation. We discussed the risks and benefits of anti-estrogen therapy with aromatase inhibitors. These include but not limited to insomnia, hot flashes, mood changes, vaginal dryness, bone density loss, and weight gain. Although rare, serious side effects including endometrial cancer, risk of blood clots were also discussed. We strongly believe that the benefits far outweigh the risks. Patient understands these risks and consented to starting treatment. Planned treatment duration is 5-10 years.  2.  Rib lesion:  This will be monitored.  Return to clinic in 3 months for follow-up

## 2014-08-15 NOTE — Progress Notes (Signed)
Patient Care Team: Iona Beard, MD as PCP - General (Family Medicine) Autumn Messing III, MD as Consulting Physician (General Surgery) Nicholas Lose, MD as Consulting Physician (Hematology and Oncology) Arloa Koh, MD as Consulting Physician (Radiation Oncology)  DIAGNOSIS: Breast cancer of 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   Staging form: Breast, AJCC 7th Edition     Clinical: Stage IV (T4b, N1, M1) - Signed by Rulon Eisenmenger, MD on 12/30/2013     Pathologic: No stage assigned - Unsigned   SUMMARY OF ONCOLOGIC HISTORY:   Breast cancer of 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   12/01/2013 Initial Diagnosis Breast cancer of 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   12/06/2013 Breast MRI Enhancing tumor throughout the right breast all quadrants with diffuse skin thickening 11.5 x 11 x 5 cm: Spiculated mass posterior UOQ right breast 2.6 x 2.5 x 2 cm and abuts pectoralis muscle: 2 abnormal axillary lymph nodes largest 2.2 cm   12/20/2013 PET scan Stage IV disease noted with 1. Diffuse hypermetabolic right breast inflammatory carcinoma.  Metastatic lymphadenopathy in the right axilla, mediastinum, and bilateral hilar regions, small bilateral pulmonary metastases, possible 9th rib metastases   12/30/2013 - 04/14/2014 Chemotherapy Taxotere and cytoxan given on day 1 of a 21 day cycle with neulasta given on day 2 for granulocyte support.  A total of 6 cycles are planned with CT chest and mammo/ultrasound planned after cycle 3.    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 Valere Dross to chest wall and axilla    CHIEF COMPLIANT:  Currently on radiation therapy  INTERVAL HISTORY: Karen Douglas is a  59 year old with above-mentioned history of right-sided breast cancer treated with neoadjuvant chemotherapy followed by mastectomy is currently undergoing adjuvant radiation therapy. She has a lot of skin changes related to radiation treatment as well as pain in the right chest wall. She is here to discuss the subsequent adjuvant treatment plan.  REVIEW OF SYSTEMS:   Constitutional: Denies fevers, chills or abnormal weight loss Eyes: Denies blurriness of vision Ears, nose, mouth, throat, and face: Denies mucositis or sore throat Respiratory: Denies cough, dyspnea or wheezes Cardiovascular: Denies palpitation, chest discomfort or lower extremity swelling Gastrointestinal:  Denies nausea, heartburn or change in bowel habits Skin: Denies abnormal skin rashes Lymphatics: Denies new lymphadenopathy or easy bruising Neurological:Denies numbness, tingling or new weaknesses Behavioral/Psych: Mood is stable, no new changes  Breast:   Right chest wall discomfort with skin changes All other systems were reviewed with the patient and are negative.  I have reviewed the past medical history, past surgical history, social history and family history with the patient and they are unchanged from previous note.  ALLERGIES:  has No Known Allergies.  MEDICATIONS:  Current Outpatient Prescriptions  Medication Sig Dispense Refill  . atorvastatin (LIPITOR) 20 MG tablet Take 20  mg by mouth daily.    . calcium carbonate (OS-CAL) 600 MG TABS tablet Take 600 mg by mouth daily with breakfast.    . polyethylene  glycol (MIRALAX / GLYCOLAX) packet Take 17 g by mouth daily as needed for mild constipation.     No current facility-administered medications for this visit.    PHYSICAL EXAMINATION: ECOG PERFORMANCE STATUS: 1 - Symptomatic but completely ambulatory  Filed Vitals:   08/15/14 0939  BP: 114/74  Pulse: 82  Temp: 97.5 F (36.4 C)  Resp: 18   Filed Weights   08/15/14 0939  Weight: 182 lb (82.555 kg)    GENERAL:alert, no distress and comfortable SKIN: skin color, texture, turgor are normal, no rashes or significant lesions EYES: normal, Conjunctiva are pink and non-injected, sclera clear OROPHARYNX:no exudate, no erythema and lips, buccal mucosa, and tongue normal  NECK: supple, thyroid normal size, non-tender, without nodularity LYMPH:  no palpable lymphadenopathy in the cervical, axillary or inguinal LUNGS: clear to auscultation and percussion with normal breathing effort HEART: regular rate & rhythm and no murmurs and no lower extremity edema ABDOMEN:abdomen soft, non-tender and normal bowel sounds Musculoskeletal:no cyanosis of digits and no clubbing  NEURO: alert & oriented x 3 with fluent speech, no focal motor/sensory deficits  LABORATORY DATA:  I have reviewed the data as listed   Chemistry      Component Value Date/Time   NA 140 05/17/2014 0544   NA 143 05/12/2014 0905   K 4.1 05/17/2014 0544   K 4.1 05/12/2014 0905   CL 111 05/17/2014 0544   CO2 24 05/17/2014 0544   CO2 24 05/12/2014 0905   BUN 13 05/17/2014 0544   BUN 11.0 05/12/2014 0905   CREATININE 0.81 05/17/2014 0544   CREATININE 0.8 05/12/2014 0905      Component Value Date/Time   CALCIUM 8.3* 05/17/2014 0544   CALCIUM 9.0 05/12/2014 0905   ALKPHOS 63 05/12/2014 0905   AST 11 05/12/2014 0905   ALT <6 05/12/2014 0905   BILITOT 0.47 05/12/2014 0905       Lab Results  Component Value Date   WBC 4.1 05/17/2014   HGB 9.2* 05/17/2014   HCT 29.9* 05/17/2014   MCV 91.4 05/17/2014   PLT 323  05/17/2014   NEUTROABS 2.0 05/12/2014   ASSESSMENT & PLAN:  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: By ultrasound 2.3 cm 6:00 position, 2.5 cm at 9:00 position, 3.1 cm retroareolar, total size 11.5 cm by MRI extending to underneath the skin and the dermis with enlarged lymph nodes biopsy proven breast cancer clinical stage TIV, N1, M1 stage IV ER/PR positive HER-2 negative Ki-67 95% status post neoadjuvant chemotherapy with Taxotere and Cytoxan 6 cycles followed by right mastectomy  05/15/2014: 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  Anti-estrogen therapy with  Anastrozole versus tamoxifen depending on her menopausal status ( patient hysterectomy 1988 and still has hot flashes)  Check FSH and estradiol levels  if she is menopausal , plan to start anastrozole 1 mg daily after the conclusion of radiation. We discussed the risks and benefits of anti-estrogen therapy with aromatase inhibitors. These include but not limited to insomnia, hot flashes, mood changes, vaginal dryness, bone density loss, and weight gain. Although rare, serious side effects including endometrial  cancer, risk of blood clots were also discussed. We strongly believe that the benefits far outweigh the risks. Patient understands these risks and consented to starting treatment. Planned treatment duration is 5-10 years.   if she is not menopausal, we will need to prescribe her tamoxifen.  I will call her with the results of these tests and prescribed appropriate medication which will start approximately around 09/11/2014. 2.  Rib lesion:  This will be monitored.  Return to clinic in  July 2016 for follow-up  No orders of the defined types were placed in  this encounter.   The patient has a good understanding of the overall plan. she agrees with it. she will call with any problems that may develop before the next visit here.   Rulon Eisenmenger, MD

## 2014-08-15 NOTE — Telephone Encounter (Signed)
Appointments made and avs printed for patient °

## 2014-08-15 NOTE — Progress Notes (Signed)
Electron beam simulation note: The patient was set up LAO to her right chest wall.  One custom block is constructed to conform the field.  0.8 cm bolus will be applied to her skin to maximize the dose to the skin surface.  She will receive a further 600 cGy in 3 sessions utilizing 6 MEV electrons.  A Rad Calc is requested.

## 2014-08-16 ENCOUNTER — Ambulatory Visit: Payer: 59

## 2014-08-16 ENCOUNTER — Ambulatory Visit: Payer: 59 | Admitting: Radiation Oncology

## 2014-08-16 ENCOUNTER — Ambulatory Visit
Admission: RE | Admit: 2014-08-16 | Discharge: 2014-08-16 | Disposition: A | Discharge status: Home / Self Care | Payer: 59 | Source: Ambulatory Visit | Attending: Radiation Oncology | Admitting: Radiation Oncology

## 2014-08-16 DIAGNOSIS — Z510 Encounter for antineoplastic radiation therapy: Secondary | ICD-10-CM | POA: Diagnosis not present

## 2014-08-16 LAB — FOLLICLE STIMULATING HORMONE: FSH: 48.4 m[IU]/mL

## 2014-08-17 ENCOUNTER — Ambulatory Visit: Payer: 59

## 2014-08-17 ENCOUNTER — Ambulatory Visit
Admission: RE | Admit: 2014-08-17 | Discharge: 2014-08-17 | Disposition: A | Discharge status: Home / Self Care | Payer: 59 | Source: Ambulatory Visit | Attending: Radiation Oncology | Admitting: Radiation Oncology

## 2014-08-17 ENCOUNTER — Ambulatory Visit: Payer: 59 | Admitting: Radiation Oncology

## 2014-08-17 DIAGNOSIS — Z510 Encounter for antineoplastic radiation therapy: Secondary | ICD-10-CM | POA: Diagnosis not present

## 2014-08-18 ENCOUNTER — Ambulatory Visit: Payer: 59 | Admitting: Radiation Oncology

## 2014-08-18 ENCOUNTER — Ambulatory Visit: Payer: 59

## 2014-08-18 ENCOUNTER — Ambulatory Visit
Admission: RE | Admit: 2014-08-18 | Discharge: 2014-08-18 | Disposition: A | Discharge status: Home / Self Care | Payer: 59 | Source: Ambulatory Visit | Attending: Radiation Oncology | Admitting: Radiation Oncology

## 2014-08-18 ENCOUNTER — Encounter: Payer: Self-pay | Admitting: Radiation Oncology

## 2014-08-18 DIAGNOSIS — Z510 Encounter for antineoplastic radiation therapy: Secondary | ICD-10-CM | POA: Diagnosis not present

## 2014-08-18 NOTE — Progress Notes (Signed)
Electron beam simulation note: The patient completed electron beam simulation/planning for her final reduced electron beam boost to the right chest wall.  She was set up en face.  One custom block is constructed to conform the field.  A Rad calc was performed (50722).  I am prescribing 600 cGy in 3 sessions utilizing 6 MEV electrons.  0.8 cm bolus will be applied to the skin.

## 2014-08-19 LAB — ESTRADIOL, ULTRA SENS: ESTRADIOL, ULTRA SENSITIVE: 7 pg/mL

## 2014-08-21 ENCOUNTER — Ambulatory Visit
Admission: RE | Admit: 2014-08-21 | Discharge: 2014-08-21 | Disposition: A | Discharge status: Home / Self Care | Payer: 59 | Source: Ambulatory Visit | Attending: Radiation Oncology | Admitting: Radiation Oncology

## 2014-08-21 ENCOUNTER — Ambulatory Visit: Payer: 59

## 2014-08-21 VITALS — BP 114/72 | HR 84 | Temp 98.5°F | Resp 12 | Wt 182.5 lb

## 2014-08-21 DIAGNOSIS — C50511 Malignant neoplasm of 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: Secondary | ICD-10-CM | POA: Insufficient documentation

## 2014-08-21 DIAGNOSIS — Z510 Encounter for antineoplastic radiation therapy: Secondary | ICD-10-CM | POA: Diagnosis not present

## 2014-08-21 MED ORDER — BIAFINE EX EMUL
Freq: Two times a day (BID) | CUTANEOUS | Status: DC
  Administered 2014-08-21: 13:00:00 via TOPICAL

## 2014-08-21 NOTE — Progress Notes (Signed)
She is currently in no pain.   Pt right breast and right clavicular- positive for Dryness, Hyperpigmentation, Pruritus, breast tenderness and dry desquamation. Upper right shoulder noted hyperpigmentation. She's experiencing some radiating "tingling" discomfort throughout her right breast.  Pt denies edema. Pt continues to apply Radiaplex and Biafine as directed. BP 114/72 mmHg  Pulse 84  Temp(Src) 98.5 F (36.9 C) (Oral)  Resp 12  Wt 182 lb 8 oz (82.781 kg)  SpO2 100%

## 2014-08-21 NOTE — Addendum Note (Signed)
Encounter addended by: Jenene Slicker, RN on: 08/21/2014 12:40 PM<BR>     Documentation filed: Dx Association, Inpatient MAR, Orders

## 2014-08-21 NOTE — Progress Notes (Signed)
Weekly Management Note:  Site: Right chest wall boost Current Dose:  1200  cGy Projected Dose: 1600  cGy  Narrative: The patient is seen today for routine under treatment assessment. CBCT/MVCT images/port films were reviewed. The chart was reviewed.   She is currently being boosted to her cutaneous recurrence that we noted during initiation of radiation therapy.  She is without new complaints today.  She is having less right chest wall discomfort.  She uses Radioplex gel and Biafine cream.  Physical Examination:  Filed Vitals:   08/21/14 0916  BP: 114/72  Pulse: 84  Temp: 98.5 F (36.9 C)  Resp: 12  .  Weight: 182 lb 8 oz (82.781 kg).  There is hyperpigmentation along with dry desquamation of the skin along the right chest wall/axilla.  The bruising noted chest wall recurrence is no longer visible or palpable.  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  This is new.    Impression: Tolerating radiation therapy well.  I doubt that the new palpable represents a chest wall recurrence, having recently completed chest wall radiation.  This will be monitored.  She will finish radiation therapy this Wednesday.  Plan: Continue radiation therapy as planned.  One-month follow-up visit after completion of radiation therapy.

## 2014-08-22 ENCOUNTER — Ambulatory Visit: Payer: 59

## 2014-08-22 ENCOUNTER — Other Ambulatory Visit: Payer: Self-pay | Admitting: Hematology and Oncology

## 2014-08-22 ENCOUNTER — Ambulatory Visit
Admission: RE | Admit: 2014-08-22 | Discharge: 2014-08-22 | Disposition: A | Discharge status: Home / Self Care | Payer: 59 | Source: Ambulatory Visit | Attending: Radiation Oncology | Admitting: Radiation Oncology

## 2014-08-22 DIAGNOSIS — Z510 Encounter for antineoplastic radiation therapy: Secondary | ICD-10-CM | POA: Diagnosis not present

## 2014-08-22 DIAGNOSIS — C50511 Malignant neoplasm of 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: Secondary | ICD-10-CM

## 2014-08-22 MED ORDER — ANASTROZOLE 1 MG PO TABS: 1.0000 mg | ORAL_TABLET | Freq: Every day | ORAL | Status: DC

## 2014-08-23 ENCOUNTER — Ambulatory Visit
Admission: RE | Admit: 2014-08-23 | Discharge: 2014-08-23 | Disposition: A | Discharge status: Home / Self Care | Payer: 59 | Source: Ambulatory Visit | Attending: Radiation Oncology | Admitting: Radiation Oncology

## 2014-08-23 ENCOUNTER — Encounter: Payer: Self-pay | Admitting: Radiation Oncology

## 2014-08-23 DIAGNOSIS — Z510 Encounter for antineoplastic radiation therapy: Secondary | ICD-10-CM | POA: Diagnosis not present

## 2014-08-23 NOTE — Progress Notes (Signed)
[facility] End of Treatment Note  Name:Karen Douglas  Date: 08/23/2014 TIR:[id number] DOB:May 16, 1955   Status:outpatient    CC: Maggie Font, MD  Dr. Autumn Messing III  REFERRING PHYSICIAN: Dr. 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  Right chest wall/ mastectomy scar boost 1000 cGy 5 sessions, recurrent right chest wall nodule boost 600 cGy in 3 sessions.                          BEAMS/ENERGY:  Tangential fields to the right chest wall with 6 MV photons.  1.0 cm bolus applied to the skin every other day to maximize the dose to the skin surface.  Mixed 6 MV/10 MV photons to the right supraclavicular/axillary region (10 MV photons LAO, dose prescribed at 3 cm depth, 6 MV photons PA to bring the mid axillary dose up to 4600 cGy in 28 sessions).  6 MEV electrons right chest wall boost initial and reduced fields.  0.8 cm custom bolus applied to maximize the dose to the skin surface.                NARRATIVE: The patient tolerated treatment well but she was noted to have a nodular recurrence just inferior to her mastectomy scar during her first undertreatment visit.  The nodule totally regressed during her course of therapy as expected.  She had extensive dry desquamation and hyperpigmentation the skin with no areas of moist desquamation.   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                        PLAN: Routine followup in one month. Patient instructed to call if questions or worsening complaints in interim.

## 2014-09-15 ENCOUNTER — Encounter (HOSPITAL_COMMUNITY): Payer: Self-pay

## 2014-09-20 ENCOUNTER — Telehealth: Payer: Self-pay | Admitting: *Deleted

## 2014-09-20 ENCOUNTER — Other Ambulatory Visit: Payer: Self-pay

## 2014-09-20 DIAGNOSIS — C50511 Malignant neoplasm of 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: Secondary | ICD-10-CM

## 2014-09-20 NOTE — Addendum Note (Signed)
Addended by: Prentiss Bells on: 09/20/2014 04:35 PM   Modules accepted: Orders

## 2014-09-20 NOTE — Telephone Encounter (Signed)
TC received from pt stating that she is experiencing swelling to her hands and feet.  She also states that her hands are throbbing and is questioning if these symptoms are related to her Anastrozole.  Please advise.  Next appt scheduled for 10/19/14.

## 2014-09-20 NOTE — Telephone Encounter (Signed)
Spoke with patient who reports she just noticed swelling yesterday and "it is not that bad".  Reports she read side effects of anastrazole included swelling.  Pt at first reports swelling is in both hands and feet, then states it is in her right hand and arm (which is her surgical side).  Let pt know we would make a referral to lymphedema clinic and they should be contacting her.  Pt voiced understanding.

## 2014-09-21 ENCOUNTER — Other Ambulatory Visit: Payer: Self-pay | Admitting: *Deleted

## 2014-09-21 ENCOUNTER — Encounter: Payer: Self-pay | Admitting: Nurse Practitioner

## 2014-09-21 ENCOUNTER — Ambulatory Visit (HOSPITAL_BASED_OUTPATIENT_CLINIC_OR_DEPARTMENT_OTHER): Payer: 59 | Admitting: Nurse Practitioner

## 2014-09-21 VITALS — BP 124/64 | HR 83 | Temp 97.8°F | Resp 18 | Wt 185.5 lb

## 2014-09-21 DIAGNOSIS — R600 Localized edema: Secondary | ICD-10-CM | POA: Diagnosis not present

## 2014-09-21 DIAGNOSIS — C50511 Malignant neoplasm of 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: Secondary | ICD-10-CM

## 2014-09-21 DIAGNOSIS — Z170 Estrogen receptor positive status [ER+]: Secondary | ICD-10-CM | POA: Diagnosis not present

## 2014-09-21 DIAGNOSIS — Z79811 Long term (current) use of aromatase inhibitors: Secondary | ICD-10-CM | POA: Diagnosis not present

## 2014-09-21 NOTE — Assessment & Plan Note (Signed)
Patient completed Management chemotherapy in January 2016, underwent a right breast mastectomy on 05/15/2014, and completed radiation treatments on 08/22/2014.  She continues to take anastrozole as directed.  The plan is for the patient to return on 10/19/2014 for a follow-up visit.

## 2014-09-21 NOTE — Assessment & Plan Note (Signed)
Patient is complaining of several-day history of right upper extremity edema.  She denies any known injury or trauma to her arm.  She denies any chest pain, chest pressure, shortness breath, or pain with inspiration.  She also denies any recent fevers or chills.  On exam.-Patient's entire right upper extremity from her shoulder to her fingertips with moderate edema.  There is no erythema, warmth, tenderness, or red streaks on exam.  Patient observed for range of motion.  We will order a Doppler ultrasound to rule out DVT.  If Doppler ultrasound is negative-will order a lymphedema clinic referral for further evaluation and management.

## 2014-09-21 NOTE — Progress Notes (Signed)
SYMPTOM MANAGEMENT CLINIC   HPI: Karen Douglas 59 y.o. female diagnosed with breast cancer.  Patient is status post chemotherapy, right breast mastectomy, and radiation therapy.  Currently undergoing anastrozole.  Oral therapy.  Patient is complaining of recent onset right upper extremity edema.  She denies any chest pain, chest pressure, shortness breath, or pain with inspiration.  She also denies any recent fevers or chills.   HPI  ROS  Past Medical History  Diagnosis Date  . Headache   . Cancer 11/28/13 bx    breast  . Breast cancer 11/28/13    right  invasive mammary ca, metastatic, mammary ca in situ  . 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 Status post chemotherapy     Completed 6 cycles of Taxotere Cytoxan.    Past Surgical History  Procedure Laterality Date  . Finger surgery    . Portacath placement N/A 12/19/2013    Procedure: INSERTION PORT-A-CATH;  Surgeon: Autumn Messing III, MD;  Location: [facility];  Service: General;  Laterality: N/A;  . Mastectomy modified radical Right 05/15/2014    Procedure: RIGHT MASTECTOMY MODIFIED RADICAL;  Surgeon: Autumn Messing III, MD;  Location: [facility];  Service: General;  Laterality: Right;  . Abdominal hysterectomy  1988    cysts on ovaries    has Breast cancer of 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; Bone metastases; Breast cancer, female; and Arm edema on her problem list.    has No Known Allergies.    Medication List       This list is accurate as of: 09/21/14  5:41 PM.  Always use your most recent med list.               anastrozole 1 MG tablet  Commonly known as:  ARIMIDEX  Take 1 tablet (1 mg total) by mouth daily.     atorvastatin 20 MG tablet  Commonly known as:  LIPITOR  Take 20 mg by mouth daily.     calcium carbonate 600 MG Tabs tablet  Commonly known as:  OS-CAL  Take 600 mg by mouth daily with breakfast.     polyethylene glycol packet  Commonly known as:  MIRALAX / GLYCOLAX  Take 17 g by mouth daily as  needed for mild constipation.         PHYSICAL EXAMINATION  Oncology Vitals 09/21/2014 08/21/2014 08/15/2014 08/14/2014 08/07/2014 07/31/2014 07/24/2014  Height - - 155 cm 155 cm 155 cm - -  Weight 84.142 kg 82.781 kg 82.555 kg 82.963 kg 83.416 kg 83.779 kg 84.868 kg  Weight (lbs) 185 lbs 8 oz 182 lbs 8 oz 182 lbs 182 lbs 14 oz 183 lbs 14 oz 184 lbs 11 oz 187 lbs 2 oz  BMI (kg/m2) - - 34.39 kg/m2 34.56 kg/m2 34.75 kg/m2 - -  Temp 97.8 98.5 97.5 97.9 97.7 97.9 97.9  Pulse 83 84 82 76 71 72 75  Resp 18 12 18  - 16 20 12   SpO2 100 100 99 - - - -  BSA (m2) - - 1.89 m2 1.89 m2 1.89 m2 - -   BP Readings from Last 3 Encounters:  09/21/14 124/64  08/21/14 114/72  08/15/14 114/74    Physical Exam  Constitutional: She is oriented to person, place, and time and well-developed, well-nourished, and in no distress.  HENT:  Head: Normocephalic and atraumatic.  Eyes: Conjunctivae and EOM are normal. Pupils are equal, round, and reactive to light. Right eye exhibits no  discharge. Left eye exhibits no discharge. No scleral icterus.  Neck: Normal range of motion.  Pulmonary/Chest: Effort normal. No respiratory distress.  Musculoskeletal: Normal range of motion. She exhibits edema. She exhibits no tenderness.  Entire right upper extremity from the shoulder to the fingertips with edema.  There is no erythema, warmth, tenderness, or red streaks.  Patient to perform range of motion.  Neurological: She is alert and oriented to person, place, and time. Gait normal.  Skin: Skin is warm and dry. No rash noted. No erythema. No pallor.  Psychiatric: Affect normal.  Nursing note and vitals reviewed.   LABORATORY DATA:. No visits with results within 3 Day(s) from this visit. Latest known visit with results is:  Appointment on 08/15/2014  Component Date Value Ref Range Status  . WBC 08/15/2014 3.0* 3.9 - 10.3 10e3/uL Final  . NEUT# 08/15/2014 2.1  1.5 - 6.5 10e3/uL Final  . HGB 08/15/2014 11.6  11.6 - 15.9 g/dL  Final  . HCT 08/15/2014 36.3  34.8 - 46.6 % Final  . Platelets 08/15/2014 252  145 - 400 10e3/uL Final  . MCV 08/15/2014 82.5  79.5 - 101.0 fL Final  . MCH 08/15/2014 26.4  25.1 - 34.0 pg Final  . MCHC 08/15/2014 32.0  31.5 - 36.0 g/dL Final  . RBC 08/15/2014 4.40  3.70 - 5.45 10e6/uL Final  . RDW 08/15/2014 19.3* 11.2 - 14.5 % Final  . lymph# 08/15/2014 0.4* 0.9 - 3.3 10e3/uL Final  . MONO# 08/15/2014 0.4  0.1 - 0.9 10e3/uL Final  . Eosinophils Absolute 08/15/2014 0.1  0.0 - 0.5 10e3/uL Final  . Basophils Absolute 08/15/2014 0.0  0.0 - 0.1 10e3/uL Final  . NEUT% 08/15/2014 70.2  38.4 - 76.8 % Final  . LYMPH% 08/15/2014 12.4* 14.0 - 49.7 % Final  . MONO% 08/15/2014 14.5* 0.0 - 14.0 % Final  . EOS% 08/15/2014 2.2  0.0 - 7.0 % Final  . BASO% 08/15/2014 0.7  0.0 - 2.0 % Final  . Estradiol, Ultra Sensitive 08/15/2014 7   Final   Comment:  Adult Female Reference Ranges for Estradiol,  Ultrasensitive, LC/MS/MS:   Follicular Phase:     16-109  pg/mL  Luteal Phase:         48-440  pg/mL  Postmenopausal Phase: < or = 10 pg/mL  Pediatric Female Reference Ranges for Estradiol,  Ultrasensitive,  LC/MS/MS:   Pre-pubertal    (1-9 years):     < or = 16 pg/mL  10-11 years:       < or = 65 pg/mL  12-14 years:       < or = 142 pg/mL  15-17 years:       < or = 283 pg/mL   . [facility] 08/15/2014 48.4   Final   Comment: Reference Ranges:         Female:                         1.4 -  18.1 mIU/mL         Female:   Follicular Phase    2.5 -  10.2 mIU/mL                   MidCycle Peak       3.4 -  33.4 mIU/mL                   Luteal Phase        1.5 -  9.1 mIU/mL                    Post Menopausal    23.0 - 116.3 mIU/mL                   Pregnant                <   0.3 mIU/mL      RADIOGRAPHIC STUDIES: No results found.  ASSESSMENT/PLAN:    Breast cancer of 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 Patient completed Management chemotherapy in January 2016, underwent a right breast mastectomy on 05/15/2014,  and completed radiation treatments on 08/22/2014.  She continues to take anastrozole as directed.  The plan is for the patient to return on 10/19/2014 for a follow-up visit.  Arm edema Patient is complaining of several-day history of right upper extremity edema.  She denies any known injury or trauma to her arm.  She denies any chest pain, chest pressure, shortness breath, or pain with inspiration.  She also denies any recent fevers or chills.  On exam.-Patient's entire right upper extremity from her shoulder to her fingertips with moderate edema.  There is no erythema, warmth, tenderness, or red streaks on exam.  Patient observed for range of motion.  We will order a Doppler ultrasound to rule out DVT.  If Doppler ultrasound is negative-will order a lymphedema clinic referral for further evaluation and management.  Patient stated understanding of all instructions; and was in agreement with this plan of care. The patient knows to call the clinic with any problems, questions or concerns.   Review/collaboration with Dr. Lindi Adie regarding all aspects of patient's visit today.   Total time spent with patient was 40 minutes;  with greater than 75 percent of that time spent in face to face counseling regarding patient's symptoms,  and coordination of care and follow up.  Disclaimer: This note was dictated with voice recognition software. Similar sounding words can inadvertently be transcribed and may not be corrected upon review.   Drue Second, NP 09/21/2014

## 2014-09-22 ENCOUNTER — Ambulatory Visit (HOSPITAL_COMMUNITY)
Admission: RE | Admit: 2014-09-22 | Discharge: 2014-09-22 | Disposition: A | Discharge status: Home / Self Care | Payer: 59 | Source: Ambulatory Visit | Attending: Nurse Practitioner | Admitting: Nurse Practitioner

## 2014-09-22 DIAGNOSIS — C50511 Malignant neoplasm of 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: Secondary | ICD-10-CM | POA: Diagnosis not present

## 2014-09-22 DIAGNOSIS — M79609 Pain in unspecified limb: Secondary | ICD-10-CM | POA: Diagnosis not present

## 2014-09-22 DIAGNOSIS — M7989 Other specified soft tissue disorders: Secondary | ICD-10-CM | POA: Diagnosis present

## 2014-09-22 NOTE — Progress Notes (Signed)
TC to pt- unable to leave a message- pt doppler was negative. The referral to the lymphedema clinic has been made and pt should expect a call shortly with an appt.

## 2014-09-22 NOTE — Progress Notes (Signed)
VASCULAR LAB PRELIMINARY  PRELIMINARY  PRELIMINARY  PRELIMINARY  Right upper extremity venous duplex completed.    Preliminary report:  Right :  No evidence of DVT or superficial thrombosis.    Dillion Stowers, RVS 09/22/2014, 10:39 AM

## 2014-09-29 ENCOUNTER — Ambulatory Visit
Admission: RE | Admit: 2014-09-29 | Discharge: 2014-09-29 | Disposition: A | Discharge status: Home / Self Care | Payer: 59 | Source: Ambulatory Visit | Attending: Hematology and Oncology | Admitting: Hematology and Oncology

## 2014-09-29 DIAGNOSIS — C50511 Malignant neoplasm of 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: Secondary | ICD-10-CM

## 2014-10-03 ENCOUNTER — Telehealth: Payer: Self-pay | Admitting: Physical Therapy

## 2014-10-03 NOTE — Telephone Encounter (Signed)
Called pt,left message regarding referral, waiting for a call back

## 2014-10-04 ENCOUNTER — Ambulatory Visit: Payer: 59 | Attending: General Surgery | Admitting: Physical Therapy

## 2014-10-04 DIAGNOSIS — I890 Lymphedema, not elsewhere classified: Secondary | ICD-10-CM

## 2014-10-04 NOTE — Therapy (Signed)
[place], [place], [address] Phone: [phone]   Fax:  [phone]  Physical Therapy Evaluation  Patient Details  Name: LAGRETTA LOSEKE MRN: [id number] Date of Birth: [birth year] Referring Provider:  Jovita Kussmaul, MD  Encounter Date: 10/04/2014      PT End of Session - 10/04/14 1238    Visit Number 1   Number of Visits 5   Date for PT Re-Evaluation 11/02/14   PT Start Time 1101   PT Stop Time 1155   PT Time Calculation (min) 54 min   Activity Tolerance Patient tolerated treatment well;Other (comment)  but tearful when talking about her husband's cancer treatment   Behavior During Therapy [facility] for tasks assessed/performed      Past Medical History  Diagnosis Date  . Headache   . Cancer 11/28/13 bx    breast  . Breast cancer 11/28/13    right  invasive mammary ca, metastatic, mammary ca in situ  . 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 Status post chemotherapy     Completed 6 cycles of Taxotere Cytoxan.    Past Surgical History  Procedure Laterality Date  . Finger surgery    . Portacath placement N/A 12/19/2013    Procedure: INSERTION PORT-A-CATH;  Surgeon: Autumn Messing III, MD;  Location: [facility];  Service: General;  Laterality: N/A;  . Mastectomy modified radical Right 05/15/2014    Procedure: RIGHT MASTECTOMY MODIFIED RADICAL;  Surgeon: Autumn Messing III, MD;  Location: [facility];  Service: General;  Laterality: Right;  . Abdominal hysterectomy  1988    cysts on ovaries    There were no vitals filed for this visit.  Visit Diagnosis:  Lymphedema of upper extremity - Plan: PT plan of care cert/re-cert      Subjective Assessment - 10/04/14 1113    Subjective Has finished her cancer treatment, but her husband is going through treatment for lymphoma, including chemotherapy (in third of six cycles now).  He has  needed fluids and a transfusion; he is diabetic and has kidney problems.  Patient became  emotional when talking about this.   Pertinent History Diagnosed with right breast cancer and had neo-adjuvant chemotherapy, then mastectomy with three nodes removed, then radiation, which was completed 08/23/2014.  On hormone medication.  No other significant health issues.   Patient Stated Goals get swelling under control   Currently in Pain? Yes   Pain Score 7    Pain Location Axilla   Pain Orientation Right   Pain Descriptors / Indicators Tightness   Aggravating Factors  reaching with the right arm   Pain Relieving Factors limiting motion with the right arm            [facility] PT Assessment - 10/04/14 0001    Assessment   Medical Diagnosis right breast cancer    Onset Date/Surgical Date 05/01/14  mastectomy   Hand Dominance Right   Precautions   Precautions Other (comment)   Precaution Comments possible bony metastases at 9th rib?; mets at axillary nodes   Restrictions   Weight Bearing Restrictions No   Balance Screen   Has the patient fallen in the past 6 months No   Has the patient had a decrease in activity level because of a fear of falling?  No   Is the patient reluctant to leave their home because of a fear of falling?  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           LYMPHEDEMA/ONCOLOGY QUESTIONNAIRE - 10/04/14 1130    Type   Cancer Type right breast   Surgeries   Mastectomy Date 05/01/14  ? date   Treatment   Past Chemotherapy Treatment Yes   Date 04/07/14  approx.   Past Radiation Treatment Yes   Date 08/23/14   Current Hormone Treatment Yes   Drug Name Arimidex   What other symptoms do you have   Are you Having Heaviness or Tightness Yes   Are you having pitting edema Yes  very mild   Lymphedema Stage   Stage STAGE 1 SPONTANEOUSLY REVERSIBLE   Lymphedema Assessments   Lymphedema Assessments Upper extremities   Right Upper Extremity Lymphedema   15 cm Proximal to Olecranon Process 31.7 cm   10 cm Proximal to Olecranon Process 31 cm   Olecranon Process 25.9 cm   15 cm Proximal to Ulnar Styloid Process 27.3 cm   10 cm Proximal to Ulnar Styloid Process 24.3 cm   Just Proximal to Ulnar Styloid Process 19 cm   Across Hand at [employer] 21.9 cm   At [place] of 2nd Digit 6.5 cm   Left Upper Extremity Lymphedema   15 cm Proximal to Olecranon Process 30.8 cm   10 cm Proximal to Olecranon Process 30.4 cm   Olecranon Process 25.2 cm   15 cm Proximal to Ulnar Styloid Process 25 cm   10 cm Proximal to Ulnar Styloid Process 21.6 cm   Just Proximal to Ulnar Styloid Process 16.4 cm   Across Hand at [employer] 18.8 cm   At [place] of 2nd Digit 5.8 cm           Quick Dash - 10/04/14 0001    Open a tight or new jar Mild difficulty   Do heavy household chores (wash walls, wash floors) Moderate difficulty   Carry a shopping bag or briefcase Mild difficulty   Wash your back Mild difficulty   Use a knife to cut food No difficulty   Recreational activities in which you take some force or impact through your arm, shoulder, or hand (golf, hammering, tennis) Mild difficulty   During the past  week, to what extent has your arm, shoulder or hand problem interfered with your normal social activities with family, friends, neighbors, or groups? Slightly   During the past week, to what extent has your arm, shoulder or hand problem limited your work or other regular daily activities Not at all   Arm, shoulder, or hand pain.  Mild   Tingling (pins and needles) in your arm, shoulder, or hand Mild   Difficulty Sleeping Mild difficulty   DASH Score 22.73 %                     PT Education - 10/04/14 1236    Education provided Yes   Education Details began educating patient about lymphedema as a chronic but treatable, manageable condition; gave patient [facility] lymphedema brochure   Person(s) Educated Patient   Methods Explanation;Handout   Comprehension Verbalized understanding                [place] - 10/04/14 1246    CC Long Term Goal  #1   Title patient will be independent in performing self-manual lymph drainage   Time 2   Period Weeks   Status New   CC Long Term Goal  #2   Title knowledgeable about lymphedema risk reduction practices   Time 2   Period Weeks   Status New   CC Long Term Goal  #3   Title will report decrease in right UE discomfort since starting manual lymph drainage   Time 2   Period Weeks   Status New            Plan - 10/04/14 1239    Clinical Impression Statement Patient who recently completed neo-adjuvant chemo, mastectomy, and radiation treatment for right breast cancer and now has right arm swelling.  She wants to minimize the number of times she comes to therapy due to the fact that her husband is also currently in treatment for lymphoma.   Pt will benefit from skilled therapeutic intervention in order to improve on the following deficits Increased edema;Pain   Rehab Potential Good   PT Frequency --  2 visits (one or two more than that if needed)   PT Treatment/Interventions Manual lymph drainage;Patient/family  education;Therapeutic exercise   PT Next Visit Plan Begin manual lymph drainage; teach patient manual lymph drainage; educate about right shoulder ROM; lymphedema risk reduction practices; strength ABC program only if she wants to do more than two visits, which is the current plan.   Recommended Other Services will fax info to [facility] re: compression sleeve, glove, and nighttime garment   Consulted and Agree with Plan of Care Patient         Problem List Patient Active Problem List   Diagnosis Date Noted  . Arm edema 09/21/2014  . Breast cancer, female 05/15/2014  . Bone metastases 01/27/2014  . Breast cancer of 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 12/01/2013    SALISBURY,DONNA 10/04/2014, 12:53 PM  [place], [place], [address] Phone: [phone]   Fax:  [place], PT 10/04/2014 12:53 PM

## 2014-10-12 ENCOUNTER — Encounter: Payer: Self-pay | Admitting: *Deleted

## 2014-10-12 NOTE — Progress Notes (Signed)
[facility], sent to scan.

## 2014-10-18 NOTE — Assessment & Plan Note (Signed)
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: By ultrasound 2.3 cm 6:00 position, 2.5 cm at 9:00 position, 3.1 cm retroareolar, total size 11.5 cm by MRI extending to underneath the skin and the dermis with enlarged lymph nodes biopsy proven breast cancer clinical stage TIV, N1, M1 stage IV ER/PR positive HER-2 negative Ki-67 95% status post neoadjuvant chemotherapy with Taxotere and Cytoxan 6 cycles followed by right mastectomy 05/15/2014: 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, 3/4 lymph nodes positive, lymphovascular invasion present, ER 99%, PR 51%, HER-2 negative, Ki-67 95% T3 N1 stage IIIa  Current treatment: adjuvant radiation therapy Treatment plan: (Patient is post-menopausal based on lab tests May 2016) 1. Anastrozole: started may 2016 Anastrozole Toxicities:  2. Rib Lesion: Being watched RTC in 6 months for follow up

## 2014-10-19 ENCOUNTER — Encounter: Payer: Self-pay | Admitting: Physical Therapy

## 2014-10-19 ENCOUNTER — Ambulatory Visit (HOSPITAL_BASED_OUTPATIENT_CLINIC_OR_DEPARTMENT_OTHER): Payer: Commercial Managed Care - HMO | Admitting: Hematology and Oncology

## 2014-10-19 ENCOUNTER — Encounter: Payer: Self-pay | Admitting: Hematology and Oncology

## 2014-10-19 ENCOUNTER — Ambulatory Visit: Payer: Commercial Managed Care - HMO | Attending: General Surgery | Admitting: Physical Therapy

## 2014-10-19 ENCOUNTER — Telehealth: Payer: Self-pay | Admitting: Hematology and Oncology

## 2014-10-19 VITALS — BP 116/70 | HR 84 | Temp 98.4°F | Resp 19 | Ht 61.0 in | Wt 182.7 lb

## 2014-10-19 DIAGNOSIS — M25611 Stiffness of right shoulder, not elsewhere classified: Secondary | ICD-10-CM | POA: Insufficient documentation

## 2014-10-19 DIAGNOSIS — C773 Secondary and unspecified malignant neoplasm of axilla and upper limb lymph nodes: Secondary | ICD-10-CM

## 2014-10-19 DIAGNOSIS — Z79811 Long term (current) use of aromatase inhibitors: Secondary | ICD-10-CM | POA: Diagnosis not present

## 2014-10-19 DIAGNOSIS — I890 Lymphedema, not elsewhere classified: Secondary | ICD-10-CM | POA: Insufficient documentation

## 2014-10-19 DIAGNOSIS — C50511 Malignant neoplasm of 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: Secondary | ICD-10-CM

## 2014-10-19 DIAGNOSIS — C50811 Malignant neoplasm of overlapping sites of right female breast: Secondary | ICD-10-CM

## 2014-10-19 DIAGNOSIS — Z170 Estrogen receptor positive status [ER+]: Secondary | ICD-10-CM | POA: Diagnosis not present

## 2014-10-19 NOTE — Telephone Encounter (Signed)
Gave patient avs report and appointment for January 2017.

## 2014-10-19 NOTE — Progress Notes (Signed)
Patient Care Team: Iona Beard, MD as PCP - General (Family Medicine) Autumn Messing III, MD as Consulting Physician (General Surgery) Nicholas Lose, MD as Consulting Physician (Hematology and Oncology) Arloa Koh, MD as Consulting Physician (Radiation Oncology)  DIAGNOSIS: Breast cancer of 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   Staging form: Breast, AJCC 7th Edition     Clinical: Stage IV (T4b, N1, M1) - Signed by Rulon Eisenmenger, MD on 12/30/2013     Pathologic: No stage assigned - Unsigned   SUMMARY OF ONCOLOGIC HISTORY:   Breast cancer of 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   12/01/2013 Initial Diagnosis Breast cancer of 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   12/06/2013 Breast MRI Enhancing tumor throughout the right breast all quadrants with diffuse skin thickening 11.5 x 11 x 5 cm: Spiculated mass posterior UOQ right breast 2.6 x 2.5 x 2 cm and abuts pectoralis muscle: 2 abnormal axillary lymph nodes largest 2.2 cm   12/20/2013 PET scan Stage IV disease noted with 1. Diffuse hypermetabolic right breast inflammatory carcinoma.  Metastatic lymphadenopathy in the right axilla, mediastinum, and bilateral hilar regions, small bilateral pulmonary metastases, possible 9th rib metastases   12/30/2013 - 04/14/2014 Chemotherapy Taxotere and cytoxan given on day 1 of a 21 day cycle with neulasta given on day 2 for granulocyte support.  A total of 6 cycles are planned with CT chest and mammo/ultrasound planned after cycle 3.    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 Valere Dross to chest wall and axilla   08/23/2014 -  Anti-estrogen oral therapy Anastrozole 1 mg daily    CHIEF COMPLIANT: Follow-up on anastrozole  INTERVAL HISTORY: Karen Douglas is a 59 year old above-mentioned history of right-sided breast cancer currently on anastrozole therapy. She is tolerating it very well. She does have occasional hot flashes and occasional itching sensation. Apparently she does not take the medicine she gets itching. Once she takes the medicine Itching goes away. Her husband is going through lymphoma chemotherapy and she is helping him out.  REVIEW OF SYSTEMS:   Constitutional: Denies fevers, chills or abnormal weight loss Eyes: Denies blurriness of vision Ears, nose, mouth, throat, and face: Denies mucositis or sore throat Respiratory: Denies cough, dyspnea or wheezes Cardiovascular: Denies palpitation, chest discomfort or lower extremity swelling Gastrointestinal:  Denies nausea, heartburn or change in bowel habits Skin: Denies abnormal skin rashes Lymphatics: Denies new lymphadenopathy or easy bruising Neurological:Denies numbness, tingling or new weaknesses Behavioral/Psych: Mood is stable, no new changes  All other systems were reviewed with the patient and are negative.  I have reviewed the past medical history, past surgical history, social history and family history with the patient and they are unchanged from previous note.  ALLERGIES:  has No Known Allergies.  MEDICATIONS:  Current Outpatient Prescriptions  Medication Sig Dispense Refill  .  anastrozole (ARIMIDEX) 1 MG tablet Take 1 tablet (1 mg total) by mouth daily. 90 tablet 3  . atorvastatin (LIPITOR) 20 MG tablet  Take 20 mg by mouth daily.    . calcium carbonate (OS-CAL) 600 MG TABS tablet Take 600 mg by mouth daily with breakfast.    . polyethylene glycol (MIRALAX / GLYCOLAX) packet Take 17 g by mouth daily as needed for mild constipation.     No current facility-administered medications for this visit.    PHYSICAL EXAMINATION: ECOG PERFORMANCE STATUS: 1 - Symptomatic but completely ambulatory  Filed Vitals:   10/19/14 1122  BP: 116/70  Pulse: 84  Temp: 98.4 F (36.9 C)  Resp: 19   Filed Weights   10/19/14 1122  Weight: 182 lb 11.2 oz (82.872 kg)    GENERAL:alert, no distress and comfortable SKIN: skin color, texture, turgor are normal, no rashes or significant lesions EYES: normal, Conjunctiva are pink and non-injected, sclera clear OROPHARYNX:no exudate, no erythema and lips, buccal mucosa, and tongue normal  NECK: supple, thyroid normal size, non-tender, without nodularity LYMPH:  no palpable lymphadenopathy in the cervical, axillary or inguinal LUNGS: clear to auscultation and percussion with normal breathing effort HEART: regular rate & rhythm and no murmurs and no lower extremity edema ABDOMEN:abdomen soft, non-tender and normal bowel sounds Musculoskeletal:no cyanosis of digits and no clubbing  NEURO: alert & oriented x 3 with fluent speech, no focal motor/sensory deficits   LABORATORY DATA:  I have reviewed the data as listed   Chemistry      Component Value Date/Time   NA 140 05/17/2014 0544   NA 143 05/12/2014 0905   K 4.1 05/17/2014 0544   K 4.1 05/12/2014 0905   CL 111 05/17/2014 0544   CO2 24 05/17/2014 0544   CO2 24 05/12/2014 0905   BUN 13 05/17/2014 0544   BUN 11.0 05/12/2014 0905   CREATININE 0.81 05/17/2014 0544   CREATININE 0.8 05/12/2014 0905      Component Value Date/Time   CALCIUM 8.3* 05/17/2014 0544   CALCIUM 9.0 05/12/2014 0905   ALKPHOS 63 05/12/2014 0905   AST 11 05/12/2014 0905   ALT <6 05/12/2014 0905   BILITOT 0.47 05/12/2014 0905        Lab Results  Component Value Date   WBC 3.0* 08/15/2014   HGB 11.6 08/15/2014   HCT 36.3 08/15/2014   MCV 82.5 08/15/2014   PLT 252 08/15/2014   NEUTROABS 2.1 08/15/2014   ASSESSMENT & PLAN:  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: By ultrasound 2.3 cm 6:00 position, 2.5 cm at 9:00 position, 3.1 cm retroareolar, total size 11.5 cm by MRI extending to underneath the skin and the dermis with enlarged lymph nodes biopsy proven breast cancer clinical stage TIV, N1, M1 stage IV ER/PR positive HER-2 negative Ki-67 95% status post neoadjuvant chemotherapy with Taxotere and Cytoxan 6 cycles followed by right mastectomy 05/15/2014: 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 Anastrozole: started may 2016  Anastrozole Toxicities: 1. Occasional hot flashes 2. Itching of the skin if she does not take the medication; goes away when she takes it.  Rib Lesion: Bone scan in 6 months for follow-up RTC in 6 months for follow up  No orders of the defined types were placed in this encounter.   The patient has a good understanding of the overall plan. she agrees with it. she will call with any problems that may develop before the next visit here.   Rulon Eisenmenger, MD

## 2014-10-19 NOTE — Therapy (Signed)
Karen Douglas, [place], [address] Phone: [phone]   Fax:  [phone]  Physical Therapy Treatment  Patient Details  Name: Karen Douglas MRN: [id number] Date of Birth: Jan 30, 1956 Referring Provider:  Jovita Kussmaul, MD  Encounter Date: 10/19/2014      PT End of Session - 10/19/14 0933    Visit Number 2   Number of Visits 5   Date for PT Re-Evaluation 11/02/14   PT Start Time 0849   PT Stop Time 0936   PT Time Calculation (min) 47 min   Activity Tolerance Patient tolerated treatment well   Behavior During Therapy Karen Douglas for tasks assessed/performed      Past Medical History  Diagnosis Date  . Headache   . Cancer 11/28/13 bx    breast  . Breast cancer 11/28/13    right  invasive mammary ca, metastatic, mammary ca in situ  . 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 Status post chemotherapy     Completed 6 cycles of Taxotere Cytoxan.    Past Surgical History  Procedure Laterality Date  . Finger surgery    . Portacath placement N/A 12/19/2013    Procedure: INSERTION PORT-A-CATH;  Surgeon: Autumn Messing III, MD;  Location: [facility];  Service: General;  Laterality: N/A;  . Mastectomy modified radical Right 05/15/2014    Procedure: RIGHT MASTECTOMY MODIFIED RADICAL;  Surgeon: Autumn Messing III, MD;  Location: [facility];  Service: General;  Laterality: Right;  . Abdominal hysterectomy  1988    cysts on ovaries    There were no vitals filed for this visit.  Visit Diagnosis:  Lymphedema of upper extremity  Shoulder stiffness, right      Subjective Assessment - 10/19/14 0854    Subjective Got measured for my compression sleeve last week.  I just want to do whatever I need to do to get rid of my swelling.   Pertinent History Diagnosed with right breast cancer and had neo-adjuvant chemotherapy, then mastectomy with three nodes removed, then radiation, which was completed 08/23/2014.  On hormone medication.  No other  significant health issues.   Patient Stated Goals get swelling under control   Currently in Pain? Yes   Pain Score 4    Pain Location Arm   Pain Orientation Right   Pain Descriptors / Indicators Tightness   Pain Type Chronic pain   Pain Onset More than a month ago   Pain Frequency Intermittent   Aggravating Factors  reaching overhead   Pain Relieving Factors rest                         [facility] Adult PT Treatment/Exercise - 10/19/14 0001    Shoulder Exercises: Pulleys   Flexion 3 minutes   Flexion Limitations After PT demo and with verbal cues for proper technique   ABduction 2 minutes   Shoulder Exercises: Therapy Ball   Flexion 10 reps   Manual Therapy   Manual Therapy Manual Lymphatic Drainage (MLD)   Manual Lymphatic Drainage (MLD) In supine: short neck, left axillary and right inguinal nodes; anterior inter-axillary and right axillo-inguinal pathway; right arm from dorsal hand to shoulder redirecting along pathways.                PT Education - 10/19/14 0904    Education provided Yes   Education Details Shoulder ROM HEP   Person(s) Educated Patient   Methods Explanation;Demonstration;Handout;Verbal  cues   Comprehension Verbalized understanding;Returned demonstration                Karen Douglas Goals - 10/04/14 1246    CC Long Term Goal  #1   Title patient will be independent in performing self-manual lymph drainage   Time 2   Period Weeks   Status New   CC Long Term Goal  #2   Title knowledgeable about lymphedema risk reduction practices   Time 2   Period Weeks   Status New   CC Long Term Goal  #3   Title will report decrease in right UE discomfort since starting manual lymph drainage   Time 2   Period Weeks   Status New            Plan - 10/19/14 0933    Clinical Impression Statement Patient reports a different view point today wanting to do therapy until she "is better" and is not feeling pushed to be limited with  her visits.  Shoulder ROM is very limited compared to baseline measurements.  Right arm swelling is visible but not severe.   Pt will benefit from skilled therapeutic intervention in order to improve on the following deficits Increased edema;Pain;Impaired UE functional use;Decreased range of motion   Rehab Potential Good   PT Frequency 2x / week   PT Treatment/Interventions Manual lymph drainage;Patient/family education;Therapeutic exercise;Manual techniques   PT Next Visit Plan PROM right shoulder, continue ROM exercises, teach self manual lymph drainage   Consulted and Agree with Plan of Care Patient        Problem List Patient Active Problem List   Diagnosis Date Noted  . Arm edema 09/21/2014  . Breast cancer, female 05/15/2014  . Bone metastases 01/27/2014  . Breast cancer of 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 12/01/2013    Karen Douglas, PT 10/19/2014, 9:39 AM  [place], [place], [address] Phone: [phone]   Fax:  [phone]

## 2014-10-19 NOTE — Patient Instructions (Signed)
Patient was instructed today in a home exercise program today for post op shoulder range of motion. These included active assist shoulder flexion in sitting, scapular retraction, wall walking with shoulder abduction, and hands behind head external rotation.  She was encouraged to do these twice a day, holding 3 seconds and repeating 5 times.  Each exercises performed x5 by patient in clinic with verbal cues for proper technique.

## 2014-10-26 ENCOUNTER — Ambulatory Visit: Payer: Commercial Managed Care - HMO | Admitting: Physical Therapy

## 2014-11-02 ENCOUNTER — Ambulatory Visit: Payer: Commercial Managed Care - HMO | Admitting: Physical Therapy

## 2014-11-02 ENCOUNTER — Encounter: Payer: Self-pay | Admitting: Physical Therapy

## 2014-11-02 DIAGNOSIS — I890 Lymphedema, not elsewhere classified: Secondary | ICD-10-CM | POA: Diagnosis not present

## 2014-11-02 DIAGNOSIS — M25611 Stiffness of right shoulder, not elsewhere classified: Secondary | ICD-10-CM

## 2014-11-02 NOTE — Therapy (Signed)
[place], [place], [address] Phone: [phone]   Fax:  [phone]  Physical Therapy Treatment  Patient Details  Name: Karen Douglas MRN: [id number] Date of Birth: 24-Feb-1956 Referring Provider:  Jovita Kussmaul, MD  Encounter Date: 11/02/2014      PT End of Session - 11/02/14 1002    Visit Number 3   Number of Visits 11   Date for PT Re-Evaluation 11/30/14   PT Start Time 0800   PT Stop Time 0846   PT Time Calculation (min) 46 min   Activity Tolerance Patient tolerated treatment well   Behavior During Therapy [facility] for tasks assessed/performed      Past Medical History  Diagnosis Date  . Headache   . Cancer 11/28/13 bx    breast  . Breast cancer 11/28/13    right  invasive mammary ca, metastatic, mammary ca in situ  . 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 Status post chemotherapy     Completed 6 cycles of Taxotere Cytoxan.    Past Surgical History  Procedure Laterality Date  . Finger surgery    . Portacath placement N/A 12/19/2013    Procedure: INSERTION PORT-A-CATH;  Surgeon: Autumn Messing III, MD;  Location: [facility];  Service: General;  Laterality: N/A;  . Mastectomy modified radical Right 05/15/2014    Procedure: RIGHT MASTECTOMY MODIFIED RADICAL;  Surgeon: Autumn Messing III, MD;  Location: [facility];  Service: General;  Laterality: Right;  . Abdominal hysterectomy  1988    cysts on ovaries    There were no vitals filed for this visit.  Visit Diagnosis:  Lymphedema of upper extremity - Plan: PT plan of care cert/re-cert  Shoulder stiffness, right - Plan: PT plan of care cert/re-cert      Subjective Assessment - 11/02/14 0759    Subjective I'm doing my exercises and I feel like my right shoulder is getting better but my left shoulder has been hurting a little.I got fitted for a sleeve and it's coming in in about 2-3 weeks.   Pertinent History Diagnosed with right breast cancer and had  neo-adjuvant chemotherapy, then mastectomy with three nodes removed, then radiation, which was completed 08/23/2014.  On hormone medication.  No other significant health issues.   Patient Stated Goals get swelling under control   Currently in Pain? Yes   Pain Score 3    Pain Location Shoulder   Pain Orientation Left   Pain Descriptors / Indicators Tightness;Shooting   Pain Type Acute pain   Pain Onset In the past 7 days   Pain Frequency Intermittent   Aggravating Factors  reaching up   Pain Relieving Factors rest   Multiple Pain Sites No            [facility] PT Assessment - 11/02/14 0001    AROM   Right Shoulder Flexion 128 Degrees   Right Shoulder ABduction 122 Degrees   Right Shoulder Internal Rotation 60 Degrees   Right Shoulder External Rotation 83 Degrees           LYMPHEDEMA/ONCOLOGY QUESTIONNAIRE - 11/02/14 0839    Right Upper Extremity Lymphedema   15 cm Proximal to Olecranon Process 30.9 cm   10 cm Proximal to Olecranon Process 30.3 cm   Olecranon Process 25.8 cm   15 cm Proximal to Ulnar Styloid Process 26.6 cm   10 cm Proximal to Ulnar Styloid Process 23.8 cm   Just Proximal to Ulnar  Styloid Process 18.5 cm   Across Hand at [employer] 21.1 cm   At [place] of 2nd Digit 6.5 cm                  [facility] Adult PT Treatment/Exercise - 11/02/14 0001    Shoulder Exercises: Standing   External Rotation Limitations ER shoulder stretch in standing in doorway 3x10 sec   Other Standing Exercises Standing with back against wall trying to achieve upright posture with head, shoulders, and butocks touching wall for anterior chet stretch and improved posture; holding 30 seconds at a time.   Shoulder Exercises: Pulleys   Flexion 3 minutes   ABduction 2 minutes   ABduction Limitations Verbal cues to avoid trunk compensation with lateral flexion   Shoulder Exercises: Therapy Ball   Flexion 10 reps   ABduction 10 reps  with right shoulder   ABduction Limitations PT  demo and verbal cues for proper technique and to avoid scaption   Manual Therapy   Manual Therapy Manual Lymphatic Drainage (MLD)   Manual Lymphatic Drainage (MLD) In supine: short neck, left axillary and right inguinal nodes; anterior inter-axillary and right axillo-inguinal pathway; right arm from dorsal hand to shoulder redirecting along pathways.   Passive ROM PROM right shoulder in supine to pt tolerance all planes focused on flexion and abduction                        Long Term Clinic Goals - 11/02/14 1005    CC Long Term Goal  #1   Title patient will be independent in performing self-manual lymph drainage   Time 4   Period Weeks   Status On-going   CC Long Term Goal  #2   Title knowledgeable about lymphedema risk reduction practices   Time 4   Period Weeks   Status On-going   CC Long Term Goal  #3   Title will report decrease in right UE discomfort since starting manual lymph drainage   Time 4   Period Weeks   Status On-going   CC Long Term Goal  #4   Title Patient will be able to increase right shoulder active flexion to >/= 130 degrees for increased ease reaching overhead   Baseline 122 degrees   Time 4   Period Weeks   Status New   CC Long Term Goal  #5   Title Patient will be able to increase right shoulder abduction to >/= 130 degrees for increased ease reaching   Time 4   Period Weeks   Status New            Plan - 11/02/14 1003    Clinical Impression Statement Patient is making good progress both with shoulder ROM and circumferential measurements in her arm.  The original treatment plan included only 5 visits due to patient request but now that she sees the benefit to therapy, she is eager to continue until she returns to prior functional level.  She has been fitted for a compression sleeve which will help control her swelling and her shoulder ROM has improved significantly.  She will benefit from continued PT to further address shoulder ROM,  swelling and begin strengthening.   Pt will benefit from skilled therapeutic intervention in order to improve on the following deficits Increased edema;Pain;Impaired UE functional use;Decreased range of motion   Rehab Potential Excellent   Clinical Impairments Affecting Rehab Potential none   PT Frequency 2x / week  PT Duration 4 weeks   PT Treatment/Interventions Manual lymph drainage;Patient/family education;Therapeutic exercise;Manual techniques   PT Next Visit Plan PROM right shoulder, continue ROM exercises, teach self manual lymph drainage; recertification completed today   Consulted and Agree with Plan of Care Patient        Problem List Patient Active Problem List   Diagnosis Date Noted  . Arm edema 09/21/2014  . Breast cancer, female 05/15/2014  . Bone metastases 01/27/2014  . Breast cancer of 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 12/01/2013   Annia Friendly, PT 11/02/2014 10:09 AM  [place], [place], [address] Phone: [phone]   Fax:  [phone]

## 2014-11-06 ENCOUNTER — Ambulatory Visit: Payer: Commercial Managed Care - HMO | Admitting: Physical Therapy

## 2014-11-08 ENCOUNTER — Ambulatory Visit: Payer: Commercial Managed Care - HMO | Attending: General Surgery | Admitting: Physical Therapy

## 2014-11-08 DIAGNOSIS — M25611 Stiffness of right shoulder, not elsewhere classified: Secondary | ICD-10-CM | POA: Diagnosis present

## 2014-11-08 DIAGNOSIS — I890 Lymphedema, not elsewhere classified: Secondary | ICD-10-CM | POA: Diagnosis present

## 2014-11-08 NOTE — Therapy (Signed)
[place], [place], [address] Phone: [phone]   Fax:  [phone]  Physical Therapy Treatment  Patient Details  Name: Karen Douglas MRN: [id number] Date of Birth: Jun 03, 1955 Referring Provider:  Jovita Kussmaul, MD  Encounter Date: 11/08/2014      PT End of Session - 11/08/14 1350    Visit Number 4   Number of Visits 11   Date for PT Re-Evaluation 11/30/14   PT Start Time 1302   PT Stop Time 1347   PT Time Calculation (min) 45 min   Activity Tolerance Patient tolerated treatment well   Behavior During Therapy [facility] for tasks assessed/performed      Past Medical History  Diagnosis Date  . Headache   . Cancer 11/28/13 bx    breast  . Breast cancer 11/28/13    right  invasive mammary ca, metastatic, mammary ca in situ  . 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 Status post chemotherapy     Completed 6 cycles of Taxotere Cytoxan.    Past Surgical History  Procedure Laterality Date  . Finger surgery    . Portacath placement N/A 12/19/2013    Procedure: INSERTION PORT-A-CATH;  Surgeon: Autumn Messing III, MD;  Location: [facility];  Service: General;  Laterality: N/A;  . Mastectomy modified radical Right 05/15/2014    Procedure: RIGHT MASTECTOMY MODIFIED RADICAL;  Surgeon: Autumn Messing III, MD;  Location: [facility];  Service: General;  Laterality: Right;  . Abdominal hysterectomy  1988    cysts on ovaries    There were no vitals filed for this visit.  Visit Diagnosis:  Lymphedema of upper extremity  Shoulder stiffness, right      Subjective Assessment - 11/08/14 1305    Subjective Did some things this weekend I learned I shouldn't do:  moved some furniture.  My arm's real sore.   Currently in Pain? Yes   Pain Score 6    Pain Location Arm   Pain Orientation Left;Upper   Pain Descriptors / Indicators Sore   Aggravating Factors  moving furniture, opening car door   Pain Relieving Factors tylenol                          [facility] Adult PT Treatment/Exercise - 11/08/14 0001    Shoulder Exercises: Pulleys   Flexion 2 minutes   ABduction 2 minutes   Manual Therapy   Manual Lymphatic Drainage (MLD) In supine: short neck, left axillary and right inguinal nodes; anterior inter-axillary and right axillo-inguinal pathway; right arm from dorsal hand to shoulder redirecting along pathways.   Passive ROM Right shoulder PROM in supine with gentle stretch to patient tolerance.                        [facility] Goals - 11/08/14 1353    CC Long Term Goal  #1   Status On-going   CC Long Term Goal  #2   Status On-going   CC Long Term Goal  #3   Status On-going   CC Long Term Goal  #4   Status On-going   CC Long Term Goal  #5   Status On-going            Plan - 11/08/14 1350    Clinical Impression Statement Patient with c/o left shoulder soreness today from having overdone activity over the weekend, so  exercise was minimized today and more focus on PROM and manual lymph drainage.  Patient says her um swelling still fluctuates but that it is better than it had been.                    Pt will benefit from skilled therapeutic intervention in order to improve on the following deficits Increased edema;Pain;Impaired UE functional use;Decreased range of motion   Rehab Potential Excellent   PT Frequency 2x / week   PT Duration 4 weeks   PT Treatment/Interventions Manual lymph drainage;Patient/family education;Therapeutic exercise;Manual techniques   PT Next Visit Plan Teach self manual lymph drainage; continue P/AA/AROM for right shoulder.  go over lymphedema risk reduction   Consulted and Agree with Plan of Care Patient        Problem List Patient Active Problem List   Diagnosis Date Noted  . Arm edema 09/21/2014  . Breast cancer, female 05/15/2014  . Bone metastases 01/27/2014  . Breast cancer of 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  12/01/2013    Ottis Vacha 11/08/2014, 1:55 PM  [place], [place], [address] Phone: [phone]   Fax:  [place], PT 11/08/2014 1:55 PM

## 2014-11-09 ENCOUNTER — Ambulatory Visit (HOSPITAL_BASED_OUTPATIENT_CLINIC_OR_DEPARTMENT_OTHER): Payer: Commercial Managed Care - HMO | Admitting: Nurse Practitioner

## 2014-11-09 ENCOUNTER — Telehealth: Payer: Self-pay | Admitting: *Deleted

## 2014-11-09 DIAGNOSIS — C7951 Secondary malignant neoplasm of bone: Secondary | ICD-10-CM | POA: Diagnosis not present

## 2014-11-09 DIAGNOSIS — C50811 Malignant neoplasm of overlapping sites of right female breast: Secondary | ICD-10-CM

## 2014-11-09 DIAGNOSIS — I890 Lymphedema, not elsewhere classified: Secondary | ICD-10-CM

## 2014-11-09 DIAGNOSIS — C50511 Malignant neoplasm of 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: Secondary | ICD-10-CM

## 2014-11-09 DIAGNOSIS — M25512 Pain in left shoulder: Secondary | ICD-10-CM

## 2014-11-09 MED ORDER — IBUPROFEN 200 MG PO TABS
800.0000 mg | ORAL_TABLET | Freq: Once | ORAL | Status: AC
  Administered 2014-11-09: 800 mg via ORAL

## 2014-11-09 MED ORDER — HYDROCODONE-ACETAMINOPHEN 5-325 MG PO TABS: 1.0000 | ORAL_TABLET | Freq: Four times a day (QID) | ORAL | Status: DC | PRN

## 2014-11-09 NOTE — Telephone Encounter (Signed)
PT. WAS MOVING FURNITURE THIS PAST WEEKEND. PT. HAS LYMPHEDEMA IN RIGHT ARM SO SHE TENDS TO DO MORE WITH HER LEFT ARM. PT. HAS HAD PAIN IN HER LEFT ARM SINCE Tuesday. THE PAIN IS FROM THE LEFT SIDE OF HER NECK DOWN TO BETWEEN HER ELBOW AND WRIST. THE PAIN IS CONSTANT ACHY TO SHOOTING BETWEEN A 7 OR 8. NO SWELLING, REDNESS, NUMBNESS, OR TINGLING. PT. DOES HAVE A PORT A CATH LEFT CHEST. NO REDNESS, SWELLING, OF DRAINAGE FROM PORT A CATH. PT. STATES FOR ABOUT TWO MONTHS SHE HAS HAD A "SMALL KNOT" ON TOP OF THE PORT A CATH. PT. HAS TAKEN TYLENOL FOR THE PAIN WHICH HELPS FOR ABOUT 30 MINUTES.

## 2014-11-10 ENCOUNTER — Ambulatory Visit (HOSPITAL_COMMUNITY)
Admission: RE | Admit: 2014-11-10 | Discharge: 2014-11-10 | Disposition: A | Discharge status: Home / Self Care | Payer: Commercial Managed Care - HMO | Source: Ambulatory Visit | Attending: Nurse Practitioner | Admitting: Nurse Practitioner

## 2014-11-10 ENCOUNTER — Telehealth: Payer: Self-pay | Admitting: *Deleted

## 2014-11-10 DIAGNOSIS — C50511 Malignant neoplasm of 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: Secondary | ICD-10-CM

## 2014-11-10 DIAGNOSIS — M79602 Pain in left arm: Secondary | ICD-10-CM | POA: Insufficient documentation

## 2014-11-10 NOTE — Progress Notes (Signed)
*  Preliminary Results* Left upper extremity venous duplex completed. Left upper extremity is negative for deep and superficial vein thrombosis.  11/10/2014 9:35 AM  Maudry Mayhew, RVT, RDCS, RDMS

## 2014-11-10 NOTE — Telephone Encounter (Signed)
Spoke to pt- Advised pt U/S results. Pt understands  she is feeling a little better today- she states the sling is helping along with her pain medication. Pt knows if no improvement she will contact PCP or Ortho next week. Pt will contact this office with any questions or concerns.

## 2014-11-11 ENCOUNTER — Encounter: Payer: Self-pay | Admitting: Nurse Practitioner

## 2014-11-11 DIAGNOSIS — M25519 Pain in unspecified shoulder: Secondary | ICD-10-CM | POA: Insufficient documentation

## 2014-11-11 DIAGNOSIS — I890 Lymphedema, not elsewhere classified: Secondary | ICD-10-CM | POA: Insufficient documentation

## 2014-11-11 NOTE — Assessment & Plan Note (Signed)
Patient states that she was moving furniture this past Sunday; and has been experiencing pain to her left shoulder region.  Since that time.  She denies any chest pain, chest pressure, shortness breath, or pain with inspiration.  She denies any recent fevers or chills.  Breath sounds clear bilaterally.  Patient appears in no acute distress.  However, patient does have tenderness to the entire left shoulder region; and is also noted to have some decreased range of motion.  Doppler ultrasound was negative for DVT.  Patient was given a prescription for hydrocodone today; and she was also advised to alternate with ibuprofen.  Will review all details of today's visit, and Doppler results with Dr. Lindi Adie.  Patient was advised to obtain a sling to allow for more rest of her left shoulder region.  She was also encouraged to follow-up with her primary care provider and/or an orthopedic referral if her left shoulder does not improve.

## 2014-11-11 NOTE — Progress Notes (Signed)
SYMPTOM MANAGEMENT CLINIC   HPI: Karen Douglas 59 y.o. female diagnosed with breast cancer; with bone metastasis.  Patient is status post chemotherapy and radiation therapy completed in May 2016.  Patient is currently undergoing anastrozole therapy.   Patient states that she was moving furniture this past Sunday; and has been experiencing pain to her left shoulder region.  Since that time.  She denies any chest pain, chest pressure, shortness breath, or pain with inspiration.  She denies any recent fevers or chills.  Patient also continues with chronic right upper extremity lymphedema.  She states that she is treated with lymphedema clinic on a regular basis.  HPI  ROS  Past Medical History  Diagnosis Date  . Headache   . Cancer 11/28/13 bx    breast  . Breast cancer 11/28/13    right  invasive mammary ca, metastatic, mammary ca in situ  . 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 Status post chemotherapy     Completed 6 cycles of Taxotere Cytoxan.    Past Surgical History  Procedure Laterality Date  . Finger surgery    . Portacath placement N/A 12/19/2013    Procedure: INSERTION PORT-A-CATH;  Surgeon: Autumn Messing III, MD;  Location: [place];  Service: General;  Laterality: N/A;  . Mastectomy modified radical Right 05/15/2014    Procedure: RIGHT MASTECTOMY MODIFIED RADICAL;  Surgeon: Autumn Messing III, MD;  Location: [facility];  Service: General;  Laterality: Right;  . Abdominal hysterectomy  1988    cysts on ovaries    has Breast cancer of 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; Bone metastases; Breast cancer, female; Arm edema; Shoulder pain; and Lymphedema of upper extremity on her problem list.    has No Known Allergies.    Medication List       This list is accurate as of: 11/09/14 11:59 PM.  Always use your most recent med list.               anastrozole 1 MG tablet  Commonly known as:  ARIMIDEX  Take 1 tablet (1 mg total) by mouth daily.     atorvastatin 20 MG  tablet  Commonly known as:  LIPITOR  Take 20 mg by mouth daily.     calcium carbonate 600 MG Tabs tablet  Commonly known as:  OS-CAL  Take 600 mg by mouth daily with breakfast.     HYDROcodone-acetaminophen 5-325 MG per tablet  Commonly known as:  NORCO/VICODIN  Take 1-2 tablets by mouth every 6 (six) hours as needed for moderate pain.     polyethylene glycol packet  Commonly known as:  MIRALAX / GLYCOLAX  Take 17 g by mouth daily as needed for mild constipation.         PHYSICAL EXAMINATION  Oncology Vitals 10/19/2014 09/21/2014 08/21/2014 08/15/2014 08/14/2014 08/07/2014 07/31/2014  Height 155 cm - - 155 cm 155 cm 155 cm -  Weight 82.872 kg 84.142 kg 82.781 kg 82.555 kg 82.963 kg 83.416 kg 83.779 kg  Weight (lbs) 182 lbs 11 oz 185 lbs 8 oz 182 lbs 8 oz 182 lbs 182 lbs 14 oz 183 lbs 14 oz 184 lbs 11 oz  BMI (kg/m2) 34.52 kg/m2 - - 34.39 kg/m2 34.56 kg/m2 34.75 kg/m2 -  Temp 98.4 97.8 98.5 97.5 97.9 97.7 97.9  Pulse 84 83 84 82 76 71 72  Resp '19 18 12 18 ' - 16 20  SpO2 99 100 100 99 - - -  BSA (m2) 1.89 m2 - - 1.89 m2 1.89 m2 1.89 m2 -   BP Readings from Last 3 Encounters:  10/19/14 116/70  09/21/14 124/64  08/21/14 114/72    Physical Exam  Constitutional: She is oriented to person, place, and time and well-developed, well-nourished, and in no distress.  HENT:  Head: Normocephalic and atraumatic.  Eyes: Conjunctivae and EOM are normal. Pupils are equal, round, and reactive to light. Right eye exhibits no discharge. Left eye exhibits no discharge. No scleral icterus.  Neck: Normal range of motion.  Pulmonary/Chest: Effort normal. No respiratory distress.  Musculoskeletal: She exhibits tenderness. She exhibits no edema.  Breath sounds clear bilaterally.  Patient appears in no acute distress.  However, patient does have tenderness to the entire left shoulder region with palpation; and is also noted to have some decreased range of motion.       Neurological: She is alert and  oriented to person, place, and time. Gait normal.  Skin: Skin is warm and dry. No rash noted. No erythema. No pallor.  Psychiatric: Affect normal.  Nursing note and vitals reviewed.   LABORATORY DATA:. No visits with results within 3 Day(s) from this visit. Latest known visit with results is:  Appointment on 08/15/2014  Component Date Value Ref Range Status  . WBC 08/15/2014 3.0* 3.9 - 10.3 10e3/uL Final  . NEUT# 08/15/2014 2.1  1.5 - 6.5 10e3/uL Final  . HGB 08/15/2014 11.6  11.6 - 15.9 g/dL Final  . HCT 08/15/2014 36.3  34.8 - 46.6 % Final  . Platelets 08/15/2014 252  145 - 400 10e3/uL Final  . MCV 08/15/2014 82.5  79.5 - 101.0 fL Final  . MCH 08/15/2014 26.4  25.1 - 34.0 pg Final  . MCHC 08/15/2014 32.0  31.5 - 36.0 g/dL Final  . RBC 08/15/2014 4.40  3.70 - 5.45 10e6/uL Final  . RDW 08/15/2014 19.3* 11.2 - 14.5 % Final  . lymph# 08/15/2014 0.4* 0.9 - 3.3 10e3/uL Final  . MONO# 08/15/2014 0.4  0.1 - 0.9 10e3/uL Final  . Eosinophils Absolute 08/15/2014 0.1  0.0 - 0.5 10e3/uL Final  . Basophils Absolute 08/15/2014 0.0  0.0 - 0.1 10e3/uL Final  . NEUT% 08/15/2014 70.2  38.4 - 76.8 % Final  . LYMPH% 08/15/2014 12.4* 14.0 - 49.7 % Final  . MONO% 08/15/2014 14.5* 0.0 - 14.0 % Final  . EOS% 08/15/2014 2.2  0.0 - 7.0 % Final  . BASO% 08/15/2014 0.7  0.0 - 2.0 % Final  . Estradiol, Ultra Sensitive 08/15/2014 7   Final   Comment:  Adult Female Reference Ranges for Estradiol,  Ultrasensitive, LC/MS/MS:   Follicular Phase:     50-354  pg/mL  Luteal Phase:         48-440  pg/mL  Postmenopausal Phase: < or = 10 pg/mL  Pediatric Female Reference Ranges for Estradiol,  Ultrasensitive,  LC/MS/MS:   Pre-pubertal    (1-9 years):     < or = 16 pg/mL  10-11 years:       < or = 65 pg/mL  12-14 years:       < or = 142 pg/mL  15-17 years:       < or = 283 pg/mL   . [facility] 08/15/2014 48.4   Final   Comment: Reference Ranges:         Female:  1.4 -  18.1 mIU/mL         Female:    Follicular Phase    2.5 -  10.2 mIU/mL                   MidCycle Peak       3.4 -  33.4 mIU/mL                   Luteal Phase        1.5 -   9.1 mIU/mL                    Post Menopausal    23.0 - 116.3 mIU/mL                   Pregnant                <   0.3 mIU/mL    Doppler US: Left upper extremity venous duplex completed. Left upper extremity is negative for deep and superficial vein thrombosis.  RADIOGRAPHIC STUDIES: No results found.  ASSESSMENT/PLAN:    Breast cancer of 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 12/01/2013 Initial Diagnosis Breast cancer of 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    12/06/2013 Breast MRI Enhancing tumor throughout the right breast all quadrants with diffuse skin thickening 11.5 x 11 x 5 cm: Spiculated mass posterior UOQ right breast 2.6 x 2.5 x 2 cm and abuts pectoralis muscle: 2 abnormal axillary lymph nodes largest 2.2 cm   12/20/2013 PET scan Stage IV disease noted with 1. Diffuse hypermetabolic right breast inflammatory carcinoma. Metastatic lymphadenopathy in the right axilla, mediastinum, and bilateral hilar regions, small bilateral pulmonary metastases, possible 9th rib metastases   12/30/2013 - 04/14/2014 Chemotherapy Taxotere and cytoxan given on day 1 of a 21 day cycle with neulasta given on day 2 for granulocyte support. A total of 6 cycles are planned with CT chest and mammo/ultrasound planned after cycle 3.    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 Valere Dross to chest wall and axilla   08/23/2014 -  Anti-estrogen oral therapy Anastrozole 1 mg daily        Patient continues to take anastrozole on a daily basis with no specific complaints.  Patient has plans to return on 04/19/2015 for her next follow-up visit.  Lymphedema of upper extremity Patient has a history of chronic right arm lymphedema.  She continues to go to the lymphedema clinic on a regular basis for treatment of lymphedema.  Shoulder pain Patient states that she was moving furniture this past Sunday; and has been experiencing pain to her left shoulder region.  Since that time.  She denies any chest pain, chest pressure, shortness breath, or pain with inspiration.  She denies any recent fevers or chills.  Breath sounds clear bilaterally.  Patient appears in no acute distress.  However, patient does have tenderness to the entire left shoulder region; and is also noted to have some decreased range of motion.  Doppler ultrasound was negative for DVT.  Patient was given a prescription for hydrocodone today; and she was also advised to alternate with ibuprofen.  Will review all details of today's visit, and Doppler results with Dr. Lindi Adie.  Patient was advised to obtain a sling to allow for more rest of her left shoulder region.  She was also encouraged to follow-up with her primary care provider and/or an orthopedic referral if her left shoulder does not improve.      Patient stated understanding of all instructions; and was in agreement with this plan of care. The patient knows to call the clinic with any problems, questions or concerns.   Review/collaboration with Dr. Lindi Adie regarding all aspects of patient's visit today.   Total  time spent with patient was 40 minutes;  with greater than 75 percent of that time spent in face to face counseling regarding patient's symptoms,  and coordination of care and follow up.  Disclaimer: This note was dictated with voice recognition software. Similar sounding words can inadvertently be transcribed and may not be corrected upon review.   Drue Second, NP 11/11/2014

## 2014-11-11 NOTE — Assessment & Plan Note (Signed)
12/01/2013 Initial Diagnosis Breast cancer of 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    12/06/2013 Breast MRI Enhancing tumor throughout the right breast all quadrants with diffuse skin thickening 11.5 x 11 x 5 cm: Spiculated mass posterior UOQ right breast 2.6 x 2.5 x 2 cm and abuts pectoralis muscle: 2 abnormal axillary lymph nodes largest 2.2 cm   12/20/2013 PET scan Stage IV disease noted with 1. Diffuse hypermetabolic right breast inflammatory carcinoma. Metastatic lymphadenopathy in the right axilla, mediastinum, and bilateral hilar regions, small bilateral pulmonary metastases, possible 9th rib metastases   12/30/2013 - 04/14/2014 Chemotherapy Taxotere and cytoxan given on day 1 of a 21 day cycle with neulasta given on day 2 for granulocyte support. A total of 6 cycles are planned with CT chest and mammo/ultrasound planned after cycle 3.    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 Valere Dross to chest wall and axilla   08/23/2014 -  Anti-estrogen oral therapy Anastrozole 1 mg daily        Patient continues to take anastrozole on a daily basis with no specific complaints.  Patient has plans to return on 04/19/2015 for her next follow-up visit.

## 2014-11-11 NOTE — Assessment & Plan Note (Signed)
Patient has a history of chronic right arm lymphedema.  She continues to go to the lymphedema clinic on a regular basis for treatment of lymphedema.

## 2014-11-12 ENCOUNTER — Ambulatory Visit (INDEPENDENT_AMBULATORY_CARE_PROVIDER_SITE_OTHER): Payer: Commercial Managed Care - HMO | Admitting: Family Medicine

## 2014-11-12 VITALS — BP 118/80 | HR 78 | Temp 98.1°F | Resp 18 | Ht 62.5 in | Wt 179.2 lb

## 2014-11-12 DIAGNOSIS — K047 Periapical abscess without sinus: Secondary | ICD-10-CM

## 2014-11-12 MED ORDER — PENICILLIN V POTASSIUM 500 MG PO TABS: 500.0000 mg | ORAL_TABLET | Freq: Four times a day (QID) | ORAL | Status: DC

## 2014-11-12 MED ORDER — OXYCODONE-ACETAMINOPHEN 5-325 MG PO TABS: 1.0000 | ORAL_TABLET | ORAL | Status: DC | PRN

## 2014-11-12 NOTE — Progress Notes (Signed)
Subjective:  This chart was scribed for Karen Cheadle, MD by [facility], medical scribe at [facility].The patient was seen in exam room 14 and the patient's care was started at 8:55 AM.   Patient ID: Karen Douglas, female    DOB: 1956/02/20, 59 y.o.   MRN: [id number] Chief Complaint  Patient presents with  . Dental Pain    Left side pain, x3 days. Needs antibiotics   HPI HPI Comments: Karen Douglas is a 59 y.o. female who presents to [facility] complaining of dental abscess located at the left lower mandible which began three days ago. She is taking ibuprofen, and some peroxide mouthwash. No appointment with her dentist scheduled. No fever or chills.  Past Medical History  Diagnosis Date  . Headache   . Cancer 11/28/13 bx    breast  . Breast cancer 11/28/13    right  invasive mammary ca, metastatic, mammary ca in situ  . 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 Status post chemotherapy     Completed 6 cycles of Taxotere Cytoxan.   Current Outpatient Prescriptions on File Prior to Visit  Medication Sig Dispense Refill  . anastrozole (ARIMIDEX) 1 MG tablet Take 1 tablet (1 mg total) by mouth daily. 90 tablet 3  . atorvastatin (LIPITOR) 20 MG tablet Take 20 mg by mouth daily.    . calcium carbonate (OS-CAL) 600 MG TABS tablet Take 600 mg by mouth daily with breakfast.    . HYDROcodone-acetaminophen (NORCO/VICODIN) 5-325 MG per tablet Take 1-2 tablets by mouth every 6 (six) hours as needed for moderate pain. 20 tablet 0  . polyethylene glycol (MIRALAX / GLYCOLAX) packet Take 17 g by mouth daily as needed for mild constipation.     No current facility-administered medications on file prior to visit.   No Known Allergies  Depression screen [facility] 2/9 11/12/2014 08/14/2014 07/17/2014 06/21/2014  Decreased Interest 0 0 0 0  Down, Depressed, Hopeless 0 0 0 0  PHQ - 2 Score 0 0 0 0     Review of Systems  Constitutional: Negative for fever, chills,  diaphoresis, activity change, appetite change and unexpected weight change.  HENT: Positive for dental problem, facial swelling, mouth sores and sore throat. Negative for congestion, drooling, ear discharge, postnasal drip, trouble swallowing and voice change.   Respiratory: Negative for shortness of breath.   Cardiovascular: Negative for chest pain.  Musculoskeletal: Positive for myalgias, joint swelling and arthralgias. Negative for back pain, gait problem, neck pain and neck stiffness.  Skin: Negative for pallor and rash.  Allergic/Immunologic: Negative for immunocompromised state.  Hematological: Positive for adenopathy. Does not bruise/bleed easily.  Psychiatric/Behavioral: Positive for sleep disturbance. Negative for dysphoric mood. The patient is not nervous/anxious.       Objective:  BP 118/80 mmHg  Pulse 78  Temp(Src) 98.1 F (36.7 C) (Oral)  Resp 18  Ht 5' 2.5" (1.588 m)  Wt 179 lb 3.2 oz (81.285 kg)  BMI 32.23 kg/m2  SpO2 95% Physical Exam  Constitutional: She is oriented to person, place, and time. She appears well-developed and well-nourished. No distress.  HENT:  Head: Normocephalic and atraumatic.  Mouth/Throat: Oropharynx is clear and moist.  Partial plate that is poorly placed, rubbing against the gum. Numerous dental caries and erosion. Bilateral upper and lower dentition. No focal gum erythema or bleeding but area of abscess and swelling is notable along lever lower posterior mandible does  not reach the angel of the mandible. Palpation of the abscess along the mandible shows a poorly defined very tender fluctuant mass approximately 3 cm in diameter. Sever halitosis.   Eyes: Pupils are equal, round, and reactive to light.  Neck: Normal range of motion. No thyromegaly present.  Cardiovascular: Normal rate and regular rhythm.   Pulmonary/Chest: Effort normal. No respiratory distress.  Musculoskeletal: Normal range of motion.  Lymphadenopathy:       Right: No  supraclavicular adenopathy present.       Left: No supraclavicular adenopathy present.  Neurological: She is alert and oriented to person, place, and time.  Skin: Skin is warm and dry.  Psychiatric: She has a normal mood and affect. Her behavior is normal.  Nursing note and vitals reviewed.     Assessment & Plan:   1. Dental abscess   VERY VERY horrible dentition - poor fitting plate, numerous caries, obvious abscess by swelling and fluctuance along left lower jaw - cover with pcn and sched w/ dentist asap.  Meds ordered this encounter  Medications  . penicillin v potassium (VEETID) 500 MG tablet    Sig: Take 1 tablet (500 mg total) by mouth 4 (four) times daily.    Dispense:  56 tablet    Refill:  0  . oxyCODONE-acetaminophen (ROXICET) 5-325 MG per tablet    Sig: Take 1-2 tablets by mouth every 4 (four) hours as needed for severe pain.    Dispense:  60 tablet    Refill:  0    I personally performed the services described in this documentation, which was scribed in my presence. The recorded information has been reviewed and considered, and addended by me as needed.  Karen Cheadle, MD MPH

## 2014-11-12 NOTE — Patient Instructions (Signed)
Abscessed Tooth An abscessed tooth is an infection around your tooth. It may be caused by holes or damage to the tooth (cavity) or a dental disease. An abscessed tooth causes mild to very bad pain in and around the tooth. See your dentist right away if you have tooth or gum pain. HOME CARE  Take your medicine as told. Finish it even if you start to feel better.  Do not drive after taking pain medicine.  Rinse your mouth (gargle) often with salt water ( teaspoon salt in 8 ounces of warm water).  Do not apply heat to the outside of your face. GET HELP RIGHT AWAY IF:   You have a temperature by mouth above 102 F (38.9 C), not controlled by medicine.  You have chills and a very bad headache.  You have problems breathing or swallowing.  Your mouth will not open.  You develop puffiness (swelling) on the neck or around the eye.  Your pain is not helped by medicine.  Your pain is getting worse instead of better. MAKE SURE YOU:   Understand these instructions.  Will watch your condition.  Will get help right away if you are not doing well or get worse. Document Released: 09/10/2007 Document Revised: 06/16/2011 Document Reviewed: 07/02/2010 [employer] Patient Information 2015 [employer]. This information is not intended to replace advice given to you by your health care provider. Make sure you discuss any questions you have with your health care provider.  

## 2014-11-13 ENCOUNTER — Ambulatory Visit: Payer: Commercial Managed Care - HMO

## 2014-11-13 DIAGNOSIS — M25611 Stiffness of right shoulder, not elsewhere classified: Secondary | ICD-10-CM

## 2014-11-13 DIAGNOSIS — I890 Lymphedema, not elsewhere classified: Secondary | ICD-10-CM

## 2014-11-13 NOTE — Therapy (Signed)
[place], [place], [address] Phone: [phone]   Fax:  [phone]  Physical Therapy Treatment  Patient Details  Name: Karen Douglas MRN: [id number] Date of Birth: 1955-07-17 Referring Provider:  Jovita Kussmaul, MD  Encounter Date: 11/13/2014      PT End of Session - 11/13/14 0849    Visit Number 5   Number of Visits 11   Date for PT Re-Evaluation 11/30/14   PT Start Time 0801   PT Stop Time 0848   PT Time Calculation (min) 47 min   Activity Tolerance Patient tolerated treatment well   Behavior During Therapy [facility] for tasks assessed/performed      Past Medical History  Diagnosis Date  . Headache   . Cancer 11/28/13 bx    breast  . Breast cancer 11/28/13    right  invasive mammary ca, metastatic, mammary ca in situ  . 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 Status post chemotherapy     Completed 6 cycles of Taxotere Cytoxan.    Past Surgical History  Procedure Laterality Date  . Finger surgery    . Portacath placement N/A 12/19/2013    Procedure: INSERTION PORT-A-CATH;  Surgeon: Autumn Messing III, MD;  Location: [place];  Service: General;  Laterality: N/A;  . Mastectomy modified radical Right 05/15/2014    Procedure: RIGHT MASTECTOMY MODIFIED RADICAL;  Surgeon: Autumn Messing III, MD;  Location: [facility];  Service: General;  Laterality: Right;  . Abdominal hysterectomy  1988    cysts on ovaries    There were no vitals filed for this visit.  Visit Diagnosis:  Lymphedema of upper extremity  Shoulder stiffness, right      Subjective Assessment - 11/13/14 0804    Subjective Had to go to the urgent care yesterday due to tooth pain and they said I had an abcess so they gave me pain meds and that helped and my Lt shoulder is feeling pretty good due to that, just feels stiff/sore. And they checked me for a blood clot in my Lt arm, that came back negative so they think I just sprained it and gave me a  sling.  My Rt shoulder is feeling great.    Currently in Pain? No/denies                         [facility] Adult PT Treatment/Exercise - 11/13/14 0001    Shoulder Exercises: Standing   Other Standing Exercises Finger Ladder for Rt UE abduction 5 reps   Shoulder Exercises: Pulleys   Flexion 3 minutes   Flexion Limitations Cuing for correct technique and pacing   ABduction 2 minutes   ABduction Limitations Cuing for no bil side trunk lean   Shoulder Exercises: Therapy Ball   Flexion 10 reps   ABduction 5 reps  With Rt UE   Shoulder Exercises: Stretch   Corner Stretch 3 reps;10 seconds  In doorway   Manual Therapy   Manual Lymphatic Drainage (MLD) In supine: short neck, diaphragmatic breathing 5 times, left axillary and right inguinal nodes; anterior inter-axillary and right axillo-inguinal pathway; right arm from dorsal hand to shoulder redirecting along pathways instructing throughout                PT Education - 11/13/14 0849    Education provided Yes   Education Details Self manual lymph drainage   Person(s) Educated Patient   Methods  Explanation;Demonstration;Verbal cues;Handout;Tactile cues   Comprehension Verbalized understanding;Returned demonstration;Need further instruction                [facility] Goals - 11/08/14 1353    CC Long Term Goal  #1   Status On-going   CC Long Term Goal  #2   Status On-going   CC Long Term Goal  #3   Status On-going   CC Long Term Goal  #4   Status On-going   CC Long Term Goal  #5   Status On-going            Plan - 11/13/14 0849    Clinical Impression Statement Pt tolerated AAROM much better today possibly due to taking oxycodone yesterday thought last night was the last time she had any. And she demonstrated a very good initial understanding of self manual lymph drainage and principles of same asking appropriate questions throughout treatment. She has noticed that overall her hand swelling  has been better since starting therapy.    Pt will benefit from skilled therapeutic intervention in order to improve on the following deficits Increased edema;Pain;Impaired UE functional use;Decreased range of motion   Rehab Potential Excellent   Clinical Impairments Affecting Rehab Potential none   PT Frequency 2x / week   PT Duration 4 weeks   PT Treatment/Interventions Manual lymph drainage;Patient/family education;Therapeutic exercise;Manual techniques   PT Next Visit Plan Assess next visit. Review self manual lymph drainage; continue P/AA/AROM for right shoulder.  go over lymphedema risk reduction   Consulted and Agree with Plan of Care Patient        Problem List Patient Active Problem List   Diagnosis Date Noted  . Shoulder pain 11/11/2014  . Lymphedema of upper extremity 11/11/2014  . Arm edema 09/21/2014  . Breast cancer, female 05/15/2014  . Bone metastases 01/27/2014  . Breast cancer of 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 12/01/2013    Otelia Limes, PTA 11/13/2014, 8:52 AM  [place], [place], [address] Phone: [phone]   Fax:  [phone]

## 2014-11-13 NOTE — Patient Instructions (Signed)
Start with circle motions over triangle area behind collar bones lightly stretching skin 5 times.  Deep Effective Breath   Standing, sitting, or laying down, place both hands on the belly. Take a deep breath IN, expanding the belly; then breath OUT, contracting the belly. Repeat __5__ times. Do __2-3__ sessions per day and before your self massage. (Smell the flowers, blow out the candles)  http://gt2.exer.us/866   Copyright  VHI. All rights reserved.  Axilla to Axilla - Sweep   On uninvolved side make 5 circles in the armpit, then pump _5__ times from involved armpit across chest to uninvolved armpit, making a pathway. Do _1__ time per day.  Copyright  VHI. All rights reserved.  Axilla to Inguinal Nodes - Sweep   On involved side, make 5 circles at groin at panty line, then pump _5__ times from armpit along side of trunk to outer hip, making your other pathway. Do __1_ time per day.  Copyright  VHI. All rights reserved.  Arm Posterior: Elbow to Shoulder - Sweep   Pump _5__ times from back of elbow to top of shoulder. Then inner to outer upper arm _5_ times, then outer arm again _5_ times. Then back to the pathways _2-3_ times. Do _1__ time per day.  Copyright  VHI. All rights reserved.  ARM: Volar Wrist to Elbow - Sweep   Pump or stationary circles _5__ times from wrist to elbow making sure to do both sides of the forearm. Then retrace your steps to the outer arm, and the pathways _2-3_ times each. Do _1__ time per day.  Copyright  VHI. All rights reserved.  ARM: Dorsum of Hand to Shoulder - Sweep   Pump or stationary circles _5__ times on back of hand including knuckle spaces and individual fingers if needed working up towards the wrist, then retrace all your steps working back up the forearm, doing both sides; upper outer arm and back to your pathways _2-3_ times each. Then do 5 circles again at uninvolved armpit and involved groin where you started! Good job!! Do  __1_ time per day.  Copyright  VHI. All rights reserved.

## 2014-11-15 ENCOUNTER — Ambulatory Visit: Payer: Commercial Managed Care - HMO

## 2014-11-15 DIAGNOSIS — I890 Lymphedema, not elsewhere classified: Secondary | ICD-10-CM

## 2014-11-15 DIAGNOSIS — M25611 Stiffness of right shoulder, not elsewhere classified: Secondary | ICD-10-CM

## 2014-11-15 NOTE — Therapy (Signed)
[place], [place], [address] Phone: [phone]   Fax:  [phone]  Physical Therapy Treatment  Patient Details  Name: Karen Douglas MRN: [id number] Date of Birth: 1956-01-21 Referring Provider:  Jovita Kussmaul, MD  Encounter Date: 11/15/2014      PT End of Session - 11/15/14 0857    PT Start Time 0845   PT Stop Time 0852   PT Time Calculation (min) 7 min   Activity Tolerance Other (comment)  No treatment today, pt had to leave   Behavior During Therapy [facility] for tasks assessed/performed      Past Medical History  Diagnosis Date  . Headache   . Cancer 11/28/13 bx    breast  . Breast cancer 11/28/13    right  invasive mammary ca, metastatic, mammary ca in situ  . 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 Status post chemotherapy     Completed 6 cycles of Taxotere Cytoxan.    Past Surgical History  Procedure Laterality Date  . Finger surgery    . Portacath placement N/A 12/19/2013    Procedure: INSERTION PORT-A-CATH;  Surgeon: Autumn Messing III, MD;  Location: [place];  Service: General;  Laterality: N/A;  . Mastectomy modified radical Right 05/15/2014    Procedure: RIGHT MASTECTOMY MODIFIED RADICAL;  Surgeon: Autumn Messing III, MD;  Location: [facility];  Service: General;  Laterality: Right;  . Abdominal hysterectomy  1988    cysts on ovaries    There were no vitals filed for this visit.  Visit Diagnosis:  Lymphedema of upper extremity  Shoulder stiffness, right      Subjective Assessment - 11/15/14 0853    Subjective I thought my appt was at 0800 today, my husband hada procedure at the hospital this morning at 0700 and was done around 0830 so I need to go get him. I do have a question about my nighttime garment though, it came in the mail.                                         [facility] Goals - 11/08/14 1353    CC Long Term Goal  #1   Status On-going   CC Long Term Goal  #2   Status On-going   CC Long Term Goal  #3   Status On-going   CC Long Term Goal  #4   Status On-going   CC Long Term Goal  #5   Status On-going            Plan - 11/15/14 1324    Clinical Impression Statement Pt had arrived early thinking her appt was at 0800 but it wasnt until 0845. When I brought her back she reported that her husband had just finished having a scan around 0830 and she needed to go pick him up so she wanted to reschedule. She brought her nighttime compression garment that came in the mail. It needs to be calibrated so issued Einar Grad phone # to pt.   Pt will benefit from skilled therapeutic intervention in order to improve on the following deficits Increased edema;Pain;Impaired UE functional use;Decreased range of motion   Rehab Potential Excellent   Clinical Impairments Affecting Rehab Potential none   PT Frequency 2x / week   PT Duration 4 weeks   PT Treatment/Interventions Manual lymph  drainage;Patient/family education;Therapeutic exercise;Manual techniques   PT Next Visit Plan Assess next visit. Review self manual lymph drainage; continue P/AA/AROM for right shoulder.  go over lymphedema risk reduction   Recommended Other Nome Alexander's phone # so pt can have her adjust her nighttime garment.   Consulted and Agree with Plan of Care Patient        Problem List Patient Active Problem List   Diagnosis Date Noted  . Shoulder pain 11/11/2014  . Lymphedema of upper extremity 11/11/2014  . Arm edema 09/21/2014  . Breast cancer, female 05/15/2014  . Bone metastases 01/27/2014  . Breast cancer of 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 12/01/2013    Otelia Limes, PTA 11/15/2014, 9:01 AM  [place], [place], [address] Phone: [phone]   Fax:  [phone]

## 2014-11-17 ENCOUNTER — Telehealth: Payer: Self-pay

## 2014-11-17 NOTE — Telephone Encounter (Signed)
Call report rcvd from [facility].  Sent to scan.

## 2014-11-21 ENCOUNTER — Ambulatory Visit: Payer: Commercial Managed Care - HMO

## 2014-11-21 DIAGNOSIS — I890 Lymphedema, not elsewhere classified: Secondary | ICD-10-CM | POA: Diagnosis not present

## 2014-11-21 DIAGNOSIS — M25611 Stiffness of right shoulder, not elsewhere classified: Secondary | ICD-10-CM

## 2014-11-21 NOTE — Therapy (Signed)
[place], [place], [address] Phone: [phone]   Fax:  [phone]  Physical Therapy Treatment  Patient Details  Name: Karen Douglas MRN: [id number] Date of Birth: [birth year] Referring Provider:  Jovita Kussmaul, MD  Encounter Date: 11/21/2014      PT End of Session - 11/21/14 1116    Visit Number 6   Number of Visits 11   Date for PT Re-Evaluation 11/30/14   PT Start Time 1016   PT Stop Time 1112   PT Time Calculation (min) 56 min   Activity Tolerance Patient tolerated treatment well   Behavior During Therapy [facility] for tasks assessed/performed      Past Medical History  Diagnosis Date  . Headache   . Cancer 11/28/13 bx    breast  . Breast cancer 11/28/13    right  invasive mammary ca, metastatic, mammary ca in situ  . 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 Status post chemotherapy     Completed 6 cycles of Taxotere Cytoxan.    Past Surgical History  Procedure Laterality Date  . Finger surgery    . Portacath placement N/A 12/19/2013    Procedure: INSERTION PORT-A-CATH;  Surgeon: Autumn Messing III, MD;  Location: [place];  Service: General;  Laterality: N/A;  . Mastectomy modified radical Right 05/15/2014    Procedure: RIGHT MASTECTOMY MODIFIED RADICAL;  Surgeon: Autumn Messing III, MD;  Location: [facility];  Service: General;  Laterality: Right;  . Abdominal hysterectomy  1988    cysts on ovaries    There were no vitals filed for this visit.  Visit Diagnosis:  Lymphedema of upper extremity  Shoulder stiffness, right      Subjective Assessment - 11/21/14 1019    Subjective My husband is doing okay, he is having chemo for his lymphoma. Got my sleeve but I can't get it on! Brought it with me today. And haven't called Hoyle Sauer yet so haven't worn my nighttime garment yet.    Currently in Pain? No/denies            [facility] PT Assessment - 11/21/14 0001    AROM   Right Shoulder Flexion 132 Degrees    Right Shoulder ABduction 122 Degrees  Pt needed to but able to stop shoulder hike during   Right Shoulder Internal Rotation 85 Degrees  WNLs   Right Shoulder External Rotation 88 Degrees  WNLs                     [facility] Adult PT Treatment/Exercise - 11/21/14 0001    Shoulder Exercises: Pulleys   Flexion 2 minutes   ABduction 2 minutes   Shoulder Exercises: Therapy Ball   Flexion 10 reps   Shoulder Exercises: Stretch   Other Shoulder Stretches Finger Ladder for Rt UE for abduction 8 reps   Manual Therapy   Manual therapy comments Pt brought her new compression sleeve/glove and therapist donned for pt and instructed her in proper wear while doing so. She reports will try to get a coworker to help her as her hsbnad is unable at this time.    Manual Lymphatic Drainage (MLD) In supine: short neck , superficial and deep abdominals, left axillary, left upper qudrant, and right inguinal nodes; anterior inter-axillary and right axillo-inguinal pathway; right arm from dorsal hand to shoulder redirecting along pathways reviewing with pt throughout  PT Education - 11/21/14 1116    Education provided Yes   Education Details Lymphedema risk reduction handout   Person(s) Educated Patient   Methods Explanation;Handout   Comprehension Verbalized understanding                [facility] Goals - 11/21/14 1120    CC Long Term Goal  #1   Title patient will be independent in performing self-manual lymph drainage   Status Achieved   CC Long Term Goal  #2   Title knowledgeable about lymphedema risk reduction practices   Status Achieved   CC Long Term Goal  #3   Title will report decrease in right UE discomfort since starting manual lymph drainage   Status Achieved   CC Long Term Goal  #4   Title Patient will be able to increase right shoulder active flexion to >/= 130 degrees for increased ease reaching overhead  132 degrees attained today 11/21/14    Status Achieved   CC Long Term Goal  #5   Title Patient will be able to increase right shoulder abduction to >/= 130 degrees for increased ease reaching  122 degrees attained today 11/21/14   Status On-going            Plan - 11/21/14 1116    Clinical Impression Statement Pt tolerated treatment very well today and had increases with her AROM measurements. She is doing well with her self manual lymph drianage and now has her day and nighttime compression garments and will be starting to wear the day time today, she reports her new garments are very comfortable. Is going to call Rexford Maus about getting her nighttime garment gauged for her.    Pt will benefit from skilled therapeutic intervention in order to improve on the following deficits Increased edema;Pain;Impaired UE functional use;Decreased range of motion   Rehab Potential Excellent   Clinical Impairments Affecting Rehab Potential none   PT Frequency 2x / week   PT Duration 4 weeks   PT Treatment/Interventions Manual lymph drainage;Patient/family education;Therapeutic exercise;Manual techniques   PT Next Visit Plan Possible discharge next 1-2 visits?? Continue P/AA/AROM and manual lymph drainage for Rt UE.    Consulted and Agree with Plan of Care Patient        Problem List Patient Active Problem List   Diagnosis Date Noted  . Shoulder pain 11/11/2014  . Lymphedema of upper extremity 11/11/2014  . Arm edema 09/21/2014  . Breast cancer, female 05/15/2014  . Bone metastases 01/27/2014  . Breast cancer of 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 12/01/2013    Otelia Limes, PTA 11/21/2014, 11:24 AM  [place], [place], [address] Phone: [phone]   Fax:  [phone]

## 2014-11-23 ENCOUNTER — Ambulatory Visit: Payer: Commercial Managed Care - HMO

## 2014-11-29 ENCOUNTER — Ambulatory Visit: Payer: Commercial Managed Care - HMO

## 2014-11-29 DIAGNOSIS — M25611 Stiffness of right shoulder, not elsewhere classified: Secondary | ICD-10-CM

## 2014-11-29 DIAGNOSIS — I890 Lymphedema, not elsewhere classified: Secondary | ICD-10-CM

## 2014-11-29 NOTE — Therapy (Signed)
[place], [place], [address] Phone: [phone]   Fax:  [phone]  Physical Therapy Treatment  Patient Details  Name: Karen Douglas MRN: [id number] Date of Birth: [birth year] Referring Provider:  Jovita Kussmaul, MD  Encounter Date: 11/29/2014      PT End of Session - 11/29/14 1324    Visit Number 7   Number of Visits 11   Date for PT Re-Evaluation 11/30/14   PT Start Time 1304  Pt arrived a few mins late   PT Stop Time 1345   PT Time Calculation (min) 41 min   Activity Tolerance Patient tolerated treatment well   Behavior During Therapy [facility] for tasks assessed/performed      Past Medical History  Diagnosis Date  . Headache   . Cancer 11/28/13 bx    breast  . Breast cancer 11/28/13    right  invasive mammary ca, metastatic, mammary ca in situ  . 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 Status post chemotherapy     Completed 6 cycles of Taxotere Cytoxan.    Past Surgical History  Procedure Laterality Date  . Finger surgery    . Portacath placement N/A 12/19/2013    Procedure: INSERTION PORT-A-CATH;  Surgeon: Autumn Messing III, MD;  Location: [place];  Service: General;  Laterality: N/A;  . Mastectomy modified radical Right 05/15/2014    Procedure: RIGHT MASTECTOMY MODIFIED RADICAL;  Surgeon: Autumn Messing III, MD;  [facility];  Service: General;  Laterality: Right;  . Abdominal hysterectomy  1988    cysts on ovaries    There were no vitals filed for this visit.  Visit Diagnosis:  Lymphedema of upper extremity  Shoulder stiffness, right      Subjective Assessment - 11/29/14 1310    Subjective Been wearing my sleeve but my Rt arm is still a little swollen. I lost Carolyns' number, need it again so I can call her to gauge my nighttime garment bc I know that will help the last little it of my swelling.    Currently in Pain? No/denies               LYMPHEDEMA/ONCOLOGY QUESTIONNAIRE  - 11/29/14 1318    Right Upper Extremity Lymphedema   15 cm Proximal to Olecranon Process 30.9 cm   10 cm Proximal to Olecranon Process 30.1 cm   Olecranon Process 25.3 cm   15 cm Proximal to Ulnar Styloid Process 26.8 cm   10 cm Proximal to Ulnar Styloid Process 24.2 cm   Just Proximal to Ulnar Styloid Process 18.1 cm   Across Hand at [employer] 20.9 cm   At [place] of 2nd Digit 6.3 cm                  [facility] Adult PT Treatment/Exercise - 11/29/14 0001    Manual Therapy   Manual Lymphatic Drainage (MLD) In supine: short neck , superficial and deep abdominals, left axillary, left upper qudrant, and right inguinal nodes; anterior inter-axillary and right axillo-inguinal pathway; right arm from dorsal hand to shoulder redirecting along pathways reviewing with pt throughout                PT Education - 11/29/14 1349    Education provided Yes   Education Details Reissued self manual lymph drainage handout and reviewed with pt again today   Person(s) Educated Patient   Methods Explanation;Demonstration;Handout   Comprehension Verbalized understanding  [facility] Goals - 11/29/14 1313    CC Long Term Goal  #1   Title patient will be independent in performing self-manual lymph drainage   Status Achieved   CC Long Term Goal  #2   Title knowledgeable about lymphedema risk reduction practices   Status Achieved   CC Long Term Goal  #3   Title will report decrease in right UE discomfort since starting manual lymph drainage   Status Achieved   CC Long Term Goal  #4   Title Patient will be able to increase right shoulder active flexion to >/= 130 degrees for increased ease reaching overhead  130 degrees attained 11/29/14   CC Long Term Goal  #5   Title Patient will be able to increase right shoulder abduction to >/= 130 degrees for increased ease reaching  134 degrees attained 11/29/14   Status Achieved            Plan - 11/29/14 1326     Clinical Impression Statement Pt has made very good progress thus far and has met all her goals. She is doing very well with compliance of her HEP and the Maintenance Phase of treatment and is ready for discharge at this time.    Pt will benefit from skilled therapeutic intervention in order to improve on the following deficits Increased edema;Pain;Impaired UE functional use;Decreased range of motion   Rehab Potential Excellent   Clinical Impairments Affecting Rehab Potential none   PT Frequency 2x / week   PT Duration 4 weeks   PT Treatment/Interventions Manual lymph drainage;Patient/family education;Therapeutic exercise;Manual techniques   PT Next Visit Plan Discharge visit.    Consulted and Agree with Plan of Care Patient        Problem List Patient Active Problem List   Diagnosis Date Noted  . Shoulder pain 11/11/2014  . Lymphedema of upper extremity 11/11/2014  . Arm edema 09/21/2014  . Breast cancer, female 05/15/2014  . Bone metastases 01/27/2014  . Breast cancer of 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 12/01/2013    Otelia Limes, PTA 11/29/2014, 1:49 PM  [place], [place], [address] Phone: [phone]   Fax:  [phone]     PHYSICAL THERAPY DISCHARGE SUMMARY  Visits from Start of Care: 7  Current functional level related to goals / functional outcomes: All goals met as noted above.   Remaining deficits: Swelling will continue to require management.   Education / Equipment: HEP, self-manual lymph drainage, compression garments. Plan: Patient agrees to discharge.  Patient goals were met. Patient is being discharged due to meeting the stated rehab goals.  ?????       Serafina Royals, PT 11/29/2014 5:28 PM

## 2014-11-29 NOTE — Patient Instructions (Signed)
Deep Effective Breath   Standing, sitting, or laying down, place both hands on the belly. Take a deep breath IN, expanding the belly; then breath OUT, contracting the belly. Repeat __5__ times. Do __2-3__ sessions per day and before your self massage.  http://gt2.exer.us/866   Copyright  VHI. All rights reserved.  Axilla to Axilla - Sweep   On uninvolved side make 5 circles in the armpit, then pump _5__ times from involved armpit across chest to uninvolved armpit, making a pathway. Do _1__ time per day.  Copyright  VHI. All rights reserved.  Axilla to Inguinal Nodes - Sweep   On involved side, make 5 circles at groin at panty line, then pump _5__ times from armpit along side of trunk to outer hip, making your other pathway. Do __1_ time per day.  Copyright  VHI. All rights reserved.  Arm Posterior: Elbow to Shoulder - Sweep   Pump _5__ times from back of elbow to top of shoulder. Then inner to outer upper arm _5_ times, then outer arm again _5_ times. Then back to the pathways _2-3_ times. Do _1__ time per day.  Copyright  VHI. All rights reserved.  ARM: Volar Wrist to Elbow - Sweep   Pump or stationary circles _5__ times from wrist to elbow making sure to do both sides of the forearm. Then retrace your steps to the outer arm, and the pathways _2-3_ times each. Do _1__ time per day.  Copyright  VHI. All rights reserved.  ARM: Dorsum of Hand to Shoulder - Sweep   Pump or stationary circles _5__ times on back of hand including knuckle spaces and individual fingers if needed working up towards the wrist, then retrace all your steps working back up the forearm, doing both sides; upper outer arm and back to your pathways _2-3_ times each. Then do 5 circles again at uninvolved armpit and involved groin where you started! Good job!! Do __1_ time per day.  Copyright  VHI. All rights reserved.     Cancer Rehab [phone]

## 2014-12-01 ENCOUNTER — Ambulatory Visit: Payer: Commercial Managed Care - HMO | Admitting: Physical Therapy

## 2014-12-05 ENCOUNTER — Telehealth: Payer: Self-pay | Admitting: Hematology and Oncology

## 2014-12-05 ENCOUNTER — Other Ambulatory Visit: Payer: Self-pay | Admitting: *Deleted

## 2014-12-05 NOTE — Telephone Encounter (Signed)
Spoke with Dr. Lindi Adie. pof sent to schedule OV with Gud[facility] this week. Patient advised that scheduler will be calling her with appt. She verbalized understanding.

## 2014-12-05 NOTE — Telephone Encounter (Signed)
Called patient for further assessment.  "I have left arm and shoulder pain that started a month ago.  It's getting worse.  There's a burning from my shoulder all the way up my neck.  I feel a knot under my arm and want someone to tell me what's going on."  Denies trouble breathing or sweating.    Non cardiac, persistent pain that has been evaluated by [facility] on 11-09-2014.  Denies seeing PCP or orthopedic doctor as recommended with [facility] visit about this pain on 11-09-2014.   This nurse advised she contact PCP for evaluation or referral.  Can go to Urgent Care without referral.  [facility] has an [facility]opedic [facility] care on [address] to see walk-ins 5:30 pm - 9:00 pm ([phone]).  This information provided.

## 2014-12-05 NOTE — Telephone Encounter (Signed)
Called patient and she is aware of her 8/31 appointment

## 2014-12-05 NOTE — Telephone Encounter (Signed)
Patient had called in and left a message at 12:10 that she was having left arm and shoulder pain and that the pain was running down her arm.  I/anne  Had just returned from the [facility] office and retrieved this message.  Roz in triage was called and given this information for her to call the patient.

## 2014-12-06 ENCOUNTER — Telehealth: Payer: Self-pay | Admitting: Hematology and Oncology

## 2014-12-06 ENCOUNTER — Ambulatory Visit (HOSPITAL_BASED_OUTPATIENT_CLINIC_OR_DEPARTMENT_OTHER): Payer: Commercial Managed Care - HMO | Admitting: Hematology and Oncology

## 2014-12-06 ENCOUNTER — Encounter: Payer: Self-pay | Admitting: Hematology and Oncology

## 2014-12-06 VITALS — BP 112/69 | HR 92 | Temp 98.4°F | Resp 18 | Ht 62.5 in | Wt 176.5 lb

## 2014-12-06 DIAGNOSIS — C7801 Secondary malignant neoplasm of right lung: Secondary | ICD-10-CM

## 2014-12-06 DIAGNOSIS — C7802 Secondary malignant neoplasm of left lung: Secondary | ICD-10-CM

## 2014-12-06 DIAGNOSIS — I890 Lymphedema, not elsewhere classified: Secondary | ICD-10-CM

## 2014-12-06 DIAGNOSIS — C50511 Malignant neoplasm of 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: Secondary | ICD-10-CM | POA: Diagnosis not present

## 2014-12-06 DIAGNOSIS — C773 Secondary and unspecified malignant neoplasm of axilla and upper limb lymph nodes: Secondary | ICD-10-CM

## 2014-12-06 DIAGNOSIS — M25512 Pain in left shoulder: Secondary | ICD-10-CM

## 2014-12-06 DIAGNOSIS — Z79811 Long term (current) use of aromatase inhibitors: Secondary | ICD-10-CM

## 2014-12-06 DIAGNOSIS — C781 Secondary malignant neoplasm of mediastinum: Secondary | ICD-10-CM

## 2014-12-06 DIAGNOSIS — Z170 Estrogen receptor positive status [ER+]: Secondary | ICD-10-CM

## 2014-12-06 NOTE — Progress Notes (Signed)
Patient Care Team: Iona Beard, MD as PCP - General (Family Medicine) Autumn Messing III, MD as Consulting Physician (General Surgery) Nicholas Lose, MD as Consulting Physician (Hematology and Oncology) Arloa Koh, MD as Consulting Physician (Radiation Oncology)  DIAGNOSIS: Breast cancer of 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   Staging form: Breast, AJCC 7th Edition     Clinical: Stage IV (T4b, N1, M1) - Signed by Rulon Eisenmenger, MD on 12/30/2013     Pathologic: No stage assigned - Unsigned   SUMMARY OF ONCOLOGIC HISTORY:   Breast cancer of 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   12/01/2013 Initial Diagnosis Breast cancer of 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   12/06/2013 Breast MRI Enhancing tumor throughout the right breast all quadrants with diffuse skin thickening 11.5 x 11 x 5 cm: Spiculated mass posterior UOQ right breast 2.6 x 2.5 x 2 cm and abuts pectoralis muscle: 2 abnormal axillary lymph nodes largest 2.2 cm   12/20/2013 PET scan Stage IV disease noted with 1. Diffuse hypermetabolic right breast inflammatory carcinoma.  Metastatic lymphadenopathy in the right axilla, mediastinum, and bilateral hilar regions, small bilateral pulmonary metastases, possible 9th rib metastases   12/30/2013 - 04/14/2014 Chemotherapy Taxotere and cytoxan given on day 1 of a 21 day cycle with neulasta given on day 2 for granulocyte support.  A total of 6 cycles are planned with CT chest and mammo/ultrasound planned after cycle 3.    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 Valere Dross to chest wall and axilla   08/23/2014 -  Anti-estrogen oral therapy Anastrozole 1 mg daily    CHIEF COMPLIANT: Left shoulder pain and left axillary lump  INTERVAL HISTORY: Karen Douglas is a 59 year old lady with above-mentioned history of right-sided breast cancer treated with neo-adjuvant chemotherapy followed by mastectomy that revealed 3 out of 4 lymph nodes were positive with lymphovascular invasion. She had received adjuvant radiation therapy and is now on anastrozole. She is tolerating it fairly well. She came in earlier this month for an urgent visit complaining of shoulder pain. There was an ultrasound that was performed which was negative. She continues to have the same pain today. She thinks that this pain started after she moved a heavy furniture. Since then it has not gotten any better.  REVIEW OF SYSTEMS:   Constitutional: Denies fevers, chills or abnormal weight loss Eyes: Denies blurriness of vision Ears, nose, mouth, throat, and face: Denies mucositis or sore throat Respiratory: Denies cough, dyspnea or wheezes Cardiovascular: Denies palpitation, chest discomfort or lower extremity swelling Gastrointestinal:  Denies nausea, heartburn or change in bowel habits Skin: Denies abnormal skin rashes Lymphatics: Denies new lymphadenopathy or easy bruising Neurological:Denies numbness, tingling or new weaknesses Behavioral/Psych: Mood is stable, no new changes  Breast: Left axilla palpable lump All other systems were reviewed with the patient  and are negative.  I have reviewed the past medical history, past surgical history, social history and family history with the patient and they are  unchanged from previous note.  ALLERGIES:  has No Known Allergies.  MEDICATIONS:  Current Outpatient Prescriptions  Medication Sig Dispense Refill  . anastrozole (ARIMIDEX) 1 MG tablet Take 1 tablet (1 mg total) by mouth daily. 90 tablet 3  . atorvastatin (LIPITOR) 20 MG tablet Take 20 mg by mouth daily.    . calcium carbonate (OS-CAL) 600 MG TABS tablet Take 600 mg by mouth daily with breakfast.    . oxyCODONE-acetaminophen (ROXICET) 5-325 MG per tablet Take 1-2 tablets by mouth every 4 (four) hours as needed for severe pain. 60 tablet 0  . penicillin v potassium (VEETID) 500 MG tablet Take 1 tablet (500 mg total) by mouth 4 (four) times daily. 56 tablet 0  . polyethylene glycol (MIRALAX / GLYCOLAX) packet Take 17 g by mouth daily as needed for mild constipation.     No current facility-administered medications for this visit.    PHYSICAL EXAMINATION: ECOG PERFORMANCE STATUS: 1 - Symptomatic but completely ambulatory  Filed Vitals:   12/06/14 0852  BP: 112/69  Pulse: 92  Temp: 98.4 F (36.9 C)  Resp: 18   Filed Weights   12/06/14 0852  Weight: 176 lb 8 oz (80.06 kg)    GENERAL:alert, no distress and comfortable SKIN: skin color, texture, turgor are normal, no rashes or significant lesions EYES: normal, Conjunctiva are pink and non-injected, sclera clear OROPHARYNX:no exudate, no erythema and lips, buccal mucosa, and tongue normal  NECK: supple, thyroid normal size, non-tender, without nodularity LYMPH:  no palpable lymphadenopathy in the cervical, axillary or inguinal LUNGS: clear to auscultation and percussion with normal breathing effort HEART: regular rate & rhythm and no murmurs and no lower extremity edema ABDOMEN:abdomen soft, non-tender and normal bowel sounds Musculoskeletal: Difficulty with extension and abduction of the left arm  NEURO: alert & oriented x 3 with fluent speech, no focal motor/sensory deficits BREAST: Palpable lump deep in the left axilla no  palpable lumps or nodules in the left breast or right chest wall. (exam performed in the presence of a chaperone)  LABORATORY DATA:  I have reviewed the data as listed   Chemistry      Component Value Date/Time   NA 140 05/17/2014 0544   NA 143 05/12/2014 0905   K 4.1 05/17/2014 0544   K 4.1 05/12/2014 0905   CL 111 05/17/2014 0544   CO2 24 05/17/2014 0544   CO2 24 05/12/2014 0905   BUN 13 05/17/2014 0544   BUN 11.0 05/12/2014 0905   CREATININE 0.81 05/17/2014 0544   CREATININE 0.8 05/12/2014 0905      Component Value Date/Time   CALCIUM 8.3* 05/17/2014 0544   CALCIUM 9.0 05/12/2014 0905   ALKPHOS 63 05/12/2014 0905   AST 11 05/12/2014 0905   ALT <6 05/12/2014 0905   BILITOT 0.47 05/12/2014 0905       Lab Results  Component Value Date   WBC 3.0* 08/15/2014   HGB 11.6 08/15/2014   HCT 36.3 08/15/2014   MCV 82.5 08/15/2014   PLT 252 08/15/2014   NEUTROABS 2.1 08/15/2014   ASSESSMENT & PLAN:  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: By ultrasound 2.3 cm 6:00 position, 2.5 cm at 9:00 position, 3.1 cm retroareolar, total size 11.5 cm by MRI extending to underneath the skin and the  dermis with enlarged lymph nodes biopsy proven breast cancer clinical stage TIV, N1, M1 stage IV ER/PR positive HER-2 negative Ki-67 95% status post neoadjuvant chemotherapy with Taxotere and Cytoxan 6 cycles followed by right mastectomy 05/15/2014: 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 Left shoulder pain: It appears to be a frozen shoulder at this point. I would like to obtain an MRI of the shoulder and if necessary refer her to orthopedics. 2. Hot flashes: Due to menopausal  symptoms.  Palpable left axillary lump: I will get a mammogram and ultrasound of the left breast and axilla and we will biopsy if necessary.  Breast Cancer Surveillance: 1. Breast exam 12/06/2014:  Palpable lump in the left axilla. 2. Mammogram on the left breast has been ordered, right breast mastectomy  3. Bone density test 09/29/2014: T score -1 in the spine and -0.1 and the left femur (normal bone mineral density)  Right arm lymphedema: Instructed the patient to use her lymphedema sleeve.   Return to clinic in  3 weeks to follow-up on these tests.   Orders Placed This Encounter  Procedures  . MR Shoulder Left W Contrast    Standing Status: Future     Number of Occurrences:      Standing Expiration Date: 02/05/2016    Order Specific Question:  Reason for Exam (SYMPTOM  OR DIAGNOSIS REQUIRED)    Answer:  Severe pain left shoulder (frozen shoulder) with H/O breast cancer    Order Specific Question:  Preferred imaging location?    Answer:  [facility]    Order Specific Question:  Does the patient have a pacemaker or implanted devices?    Answer:  No    Order Specific Question:  What is the patient's sedation requirement?    Answer:  No Sedation  . MM Digital Diagnostic Unilat L    Standing Status: Future     Number of Occurrences:      Standing Expiration Date: 12/06/2015    Order Specific Question:  Reason for Exam (SYMPTOM  OR DIAGNOSIS REQUIRED)    Answer:  Left axillary lump (H/O right breast cancer); Biopsy if necessary    Order Specific Question:  Is the patient pregnant?    Answer:  No    Order Specific Question:  Preferred imaging location?    Answer:  External     Comments:  [facility]  . US BREAST COMPLETE UNI LEFT INC AXILLA    Standing Status: Future     Number of Occurrences:      Standing Expiration Date: 02/05/2016    Order Specific Question:  Reason for Exam (SYMPTOM  OR DIAGNOSIS REQUIRED)    Answer:  Left axillary lump (H/O right breast cancer); Biopsy  if necessary    Order Specific Question:  Preferred imaging location?    Answer:  External     Comments:  [facility]   The patient has a good understanding of the overall plan. she agrees with it. she will call with any problems that may develop before the next visit here.   Rulon Eisenmenger, MD

## 2014-12-06 NOTE — Assessment & Plan Note (Signed)
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 Breast exam 12/06/2014: Normal 2. Mammogram to be done November 2016 3. Bone density test 09/29/2014: T score -1 in the spine and -0.1 and the left femur (normal bone mineral density)  Left shoulder pain Right arm lymphedema  Return to clinic in 6 months for follow-up

## 2014-12-06 NOTE — Telephone Encounter (Signed)
Gave avs & calendar for September. [facility] 09/07 7:45

## 2014-12-07 ENCOUNTER — Telehealth: Payer: Self-pay | Admitting: *Deleted

## 2014-12-07 ENCOUNTER — Other Ambulatory Visit: Payer: Self-pay | Admitting: *Deleted

## 2014-12-07 DIAGNOSIS — C50511 Malignant neoplasm of 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: Secondary | ICD-10-CM

## 2014-12-07 NOTE — Telephone Encounter (Signed)
NEED ORDER CLARIFICATION SO X RAY CAN BE SCHEDULED

## 2014-12-08 ENCOUNTER — Other Ambulatory Visit: Payer: Self-pay | Admitting: *Deleted

## 2014-12-13 ENCOUNTER — Ambulatory Visit (HOSPITAL_COMMUNITY)
Admission: RE | Admit: 2014-12-13 | Discharge: 2014-12-13 | Disposition: A | Discharge status: Home / Self Care | Payer: Commercial Managed Care - HMO | Source: Ambulatory Visit | Attending: Hematology and Oncology | Admitting: Hematology and Oncology

## 2014-12-13 ENCOUNTER — Telehealth: Payer: Self-pay | Admitting: Hematology and Oncology

## 2014-12-13 ENCOUNTER — Other Ambulatory Visit: Payer: Self-pay

## 2014-12-13 ENCOUNTER — Telehealth: Payer: Self-pay | Admitting: *Deleted

## 2014-12-13 ENCOUNTER — Ambulatory Visit (HOSPITAL_COMMUNITY): Admission: RE | Admit: 2014-12-13 | Payer: Commercial Managed Care - HMO | Source: Ambulatory Visit

## 2014-12-13 DIAGNOSIS — C7951 Secondary malignant neoplasm of bone: Secondary | ICD-10-CM | POA: Insufficient documentation

## 2014-12-13 DIAGNOSIS — R590 Localized enlarged lymph nodes: Secondary | ICD-10-CM | POA: Diagnosis not present

## 2014-12-13 DIAGNOSIS — M25512 Pain in left shoulder: Secondary | ICD-10-CM | POA: Diagnosis present

## 2014-12-13 NOTE — Telephone Encounter (Signed)
Pt coming in to [facility] 12/14/14

## 2014-12-13 NOTE — Progress Notes (Signed)
Orders faxed to [facility], then placed in scanning.

## 2014-12-13 NOTE — Telephone Encounter (Signed)
Called patient back today and Probation officer discussed adding her lower abdomen on to her MRI of the shoulder this evening.  Encouraged patient to see Selena Lesser, NP in the [facility] tomorrow to further evaluate the pain her her right side that has been present for one week.  Patient agreed.  POF placed for a 2 pm appt for tomorrow.  Note routed to [place].

## 2014-12-13 NOTE — Telephone Encounter (Signed)
s.w. pt and advised on tomorrows appt.....pt ok and aware °

## 2014-12-13 NOTE — Telephone Encounter (Signed)
Patient called in and left a message that she was having right side pain and to see if we wanted to add to her scan appointment today

## 2014-12-13 NOTE — Telephone Encounter (Signed)
Patient called in stating that she is having right sided pain when she takes a breath. These symptoms have been going on for about a week off and on. Patient would like to know if MD Gudena would like to scan her chest/abdomen. Patient is having a MRI today at 5 pm. Please advise. Message sent to RN Terri/MD Lindi Adie

## 2014-12-14 ENCOUNTER — Other Ambulatory Visit: Payer: Self-pay | Admitting: Radiology

## 2014-12-14 ENCOUNTER — Ambulatory Visit (HOSPITAL_BASED_OUTPATIENT_CLINIC_OR_DEPARTMENT_OTHER): Payer: Commercial Managed Care - HMO | Admitting: Nurse Practitioner

## 2014-12-14 DIAGNOSIS — C50511 Malignant neoplasm of 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: Secondary | ICD-10-CM

## 2014-12-14 DIAGNOSIS — C781 Secondary malignant neoplasm of mediastinum: Secondary | ICD-10-CM

## 2014-12-14 DIAGNOSIS — C773 Secondary and unspecified malignant neoplasm of axilla and upper limb lymph nodes: Secondary | ICD-10-CM

## 2014-12-14 MED ORDER — HYDROCODONE-ACETAMINOPHEN 5-325 MG PO TABS: 1.0000 | ORAL_TABLET | Freq: Four times a day (QID) | ORAL | Status: DC | PRN

## 2014-12-15 ENCOUNTER — Telehealth: Payer: Self-pay | Admitting: Hematology and Oncology

## 2014-12-15 ENCOUNTER — Other Ambulatory Visit: Payer: Self-pay

## 2014-12-15 ENCOUNTER — Ambulatory Visit (HOSPITAL_BASED_OUTPATIENT_CLINIC_OR_DEPARTMENT_OTHER): Payer: Commercial Managed Care - HMO | Admitting: Hematology and Oncology

## 2014-12-15 ENCOUNTER — Other Ambulatory Visit: Payer: Commercial Managed Care - HMO

## 2014-12-15 ENCOUNTER — Encounter: Payer: Self-pay | Admitting: Hematology and Oncology

## 2014-12-15 VITALS — BP 142/94 | HR 88 | Temp 98.1°F | Resp 17 | Ht 61.0 in | Wt 170.5 lb

## 2014-12-15 DIAGNOSIS — C7951 Secondary malignant neoplasm of bone: Secondary | ICD-10-CM

## 2014-12-15 DIAGNOSIS — C7801 Secondary malignant neoplasm of right lung: Secondary | ICD-10-CM

## 2014-12-15 DIAGNOSIS — C7802 Secondary malignant neoplasm of left lung: Secondary | ICD-10-CM | POA: Diagnosis not present

## 2014-12-15 DIAGNOSIS — C50511 Malignant neoplasm of 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: Secondary | ICD-10-CM

## 2014-12-15 DIAGNOSIS — C773 Secondary and unspecified malignant neoplasm of axilla and upper limb lymph nodes: Secondary | ICD-10-CM | POA: Diagnosis not present

## 2014-12-15 DIAGNOSIS — Z170 Estrogen receptor positive status [ER+]: Secondary | ICD-10-CM

## 2014-12-15 MED ORDER — LIDOCAINE-PRILOCAINE 2.5-2.5 % EX CREA: TOPICAL_CREAM | CUTANEOUS | Status: DC

## 2014-12-15 MED ORDER — PROCHLORPERAZINE MALEATE 10 MG PO TABS: 10.0000 mg | ORAL_TABLET | Freq: Four times a day (QID) | ORAL | Status: DC | PRN

## 2014-12-15 MED ORDER — ONDANSETRON HCL 8 MG PO TABS: 8.0000 mg | ORAL_TABLET | Freq: Two times a day (BID) | ORAL | Status: DC

## 2014-12-15 NOTE — Progress Notes (Signed)
Called and scheduled patient for a PET scan- 12/18/14 at 11:00.  Patient notified and instructions given, patient stated understanding.

## 2014-12-15 NOTE — Addendum Note (Signed)
Addended by: Prentiss Bells on: 12/15/2014 10:58 AM   Modules accepted: Orders

## 2014-12-15 NOTE — Progress Notes (Signed)
Patient Care Team: Iona Beard, MD as PCP - General (Family Medicine) Autumn Messing III, MD as Consulting Physician (General Surgery) Nicholas Lose, MD as Consulting Physician (Hematology and Oncology) Arloa Koh, MD as Consulting Physician (Radiation Oncology)  DIAGNOSIS: Breast cancer of 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   Staging form: Breast, AJCC 7th Edition     Clinical: Stage IV (T4b, N1, M1) - Signed by Rulon Eisenmenger, MD on 12/30/2013     Pathologic: No stage assigned - Unsigned   SUMMARY OF ONCOLOGIC HISTORY:   Breast cancer of 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   12/01/2013 Initial Diagnosis Breast cancer of 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   12/06/2013 Breast MRI Enhancing tumor throughout the right breast all quadrants with diffuse skin thickening 11.5 x 11 x 5 cm: Spiculated mass posterior UOQ right breast 2.6 x 2.5 x 2 cm and abuts pectoralis muscle: 2 abnormal axillary lymph nodes largest 2.2 cm   12/20/2013 PET scan Stage IV disease noted with 1. Diffuse hypermetabolic right breast inflammatory carcinoma.  Metastatic lymphadenopathy in the right axilla, mediastinum, and bilateral hilar regions, small bilateral pulmonary metastases, possible 9th rib metastases   12/30/2013 - 04/14/2014 Chemotherapy Taxotere and cytoxan given on day 1 of a 21 day cycle with neulasta given on day 2 for granulocyte support.  A total of 6 cycles are planned with CT chest and mammo/ultrasound planned after cycle 3.    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 Valere Dross to chest wall and axilla   08/23/2014 -  Anti-estrogen oral therapy Anastrozole 1 mg daily   12/14/2014 Imaging MRI right shoulder: Bone metastases involving the glenoid and coracoid periostitis and possible extraosseous ext of tumor causing soft tissue edema, ? nondisplaced pathologic coracoid base fracture, lesions in proximal humerus, ext left axillary lymphaden    CHIEF COMPLIANT: follow-up of shoulder MRI  INTERVAL HISTORY: Karen Douglas is a 59 year old with above-mentioned history of right breast cancer previously treated with Taxotere Cytoxan followed by mastectomy on the right side followed by radiation and anastrozole. While she is on anastrozole she developed right shoulder pain and we obtain an MRI of the shoulder which showed extensive 1 metastatic disease with extensive left axillary lymphadenopathy. She underwent a biopsy of the left x-ray lymph node yesterday. She is here today to discuss the results of MRI. She reports that once she started pain medications especially ibuprofen her symptoms have improved.  REVIEW OF SYSTEMS:   Constitutional: Denies fevers, chills or abnormal weight loss Eyes: Denies blurriness of vision Ears, nose, mouth, throat, and face: Denies mucositis or sore throat Respiratory: Denies cough, dyspnea or wheezes Cardiovascular: Denies palpitation, chest discomfort or lower extremity swelling Gastrointestinal:  Denies nausea, heartburn or change in bowel habits Skin: Denies abnormal skin rashes Lymphatics: Denies new lymphadenopathy or easy bruising Neurological:Denies  numbness, tingling or new weaknesses Behavioral/Psych: Mood is stable, no new changes  Breast:  palpable left axillary lymphadenopathy All other systems were reviewed with the patient and are negative.  I have reviewed the past medical history, past surgical history, social history and family history with the patient and they are unchanged from previous note.  ALLERGIES:  has No Known Allergies.  MEDICATIONS:  Current Outpatient Prescriptions  Medication Sig Dispense Refill  . anastrozole (ARIMIDEX) 1 MG tablet Take 1 tablet (1 mg total) by mouth daily. 90 tablet 3  . atorvastatin (LIPITOR) 20 MG tablet Take 20 mg by mouth daily.    . calcium carbonate (OS-CAL) 600 MG TABS tablet Take 600 mg by mouth daily with breakfast.    . HYDROcodone-acetaminophen (NORCO/VICODIN) 5-325 MG per tablet Take 1-2 tablets by mouth every 6 (six) hours as needed for moderate pain. 30 tablet 0  . lidocaine-prilocaine (EMLA) cream Apply to affected area once 30 g 3  . ondansetron (ZOFRAN) 8 MG tablet Take 1 tablet (8 mg total) by mouth 2 (two) times daily. Start the day after chemo for 2 days. Then take as needed for nausea or vomiting. 30 tablet 1  . penicillin v potassium (VEETID) 500 MG tablet Take 1 tablet (500 mg total) by mouth 4 (four) times daily. 56 tablet 0  . polyethylene glycol (MIRALAX / GLYCOLAX) packet Take 17 g by mouth daily as needed for mild constipation.    . prochlorperazine (COMPAZINE) 10 MG tablet Take 1 tablet (10 mg total) by mouth every 6 (six) hours as needed (Nausea or vomiting). 30 tablet 1   No current facility-administered medications for this visit.    PHYSICAL EXAMINATION: ECOG PERFORMANCE STATUS: 1 - Symptomatic but completely ambulatory  Filed Vitals:   12/15/14 0904  BP: 142/94  Pulse: 88  Temp: 98.1 F (36.7 C)  Resp: 17   Filed Weights   12/15/14 0904  Weight: 170 lb 8 oz (77.338 kg)    GENERAL:alert, no distress and comfortable SKIN: skin color, texture, turgor are normal, no rashes or significant lesions EYES: normal, Conjunctiva are pink and  non-injected, sclera clear OROPHARYNX:no exudate, no erythema and lips, buccal mucosa, and tongue normal  NECK: supple, thyroid normal size, non-tender, without nodularity LYMPH:  no palpable lymphadenopathy in the cervical, axillary or inguinal LUNGS: clear to auscultation and percussion with normal breathing effort HEART: regular rate & rhythm and no murmurs and no lower extremity edema ABDOMEN:abdomen soft, non-tender and normal bowel sounds Musculoskeletal:no cyanosis of digits and no clubbing  NEURO: alert & oriented x 3 with fluent speech, no focal motor/sensory deficits BREAST:palpable left axillary lymphadenopathy. (exam performed in the presence of a chaperone)  LABORATORY DATA:  I have reviewed the data as listed   Chemistry      Component Value Date/Time   NA 140 05/17/2014 0544   NA 143 05/12/2014 0905   K 4.1 05/17/2014 0544   K 4.1 05/12/2014 0905   CL 111 05/17/2014 0544   CO2 24 05/17/2014 0544   CO2 24 05/12/2014 0905   BUN 13 05/17/2014 0544   BUN 11.0 05/12/2014 0905   CREATININE 0.81 05/17/2014 0544   CREATININE 0.8 05/12/2014 0905      Component Value Date/Time   CALCIUM 8.3* 05/17/2014 0544   CALCIUM 9.0 05/12/2014 0905   ALKPHOS 63 05/12/2014 0905   AST 11 05/12/2014 0905   ALT <6 05/12/2014 0905   BILITOT 0.47 05/12/2014 0905       Lab Results  Component Value Date   WBC 3.0* 08/15/2014   HGB 11.6 08/15/2014   HCT 36.3 08/15/2014   MCV 82.5 08/15/2014   PLT 252 08/15/2014   NEUTROABS 2.1 08/15/2014     RADIOGRAPHIC STUDIES: I have personally reviewed the radiology reports and agreed with their findings. Mr Shoulder Left Wo Contrast  12/14/2014   CLINICAL DATA:  Frozen left shoulder for 2 weeks. History of breast cancer.  EXAM: MRI OF THE LEFT SHOULDER WITHOUT CONTRAST  TECHNIQUE: Multiplanar, multisequence MR imaging of the shoulder was performed. No intravenous contrast was administered.  COMPARISON:  02/27/2015  FINDINGS: Despite efforts  by the technologist and patient, motion artifact is present on today's exam and could not be eliminated. This reduces exam sensitivity and specificity.  Rotator cuff:  Mild supraspinatus tendinopathy.  Muscles:  Unremarkable  Biceps long head:  Unremarkable  Acromioclavicular Joint: Mild degenerative arthropathy. Subacromial morphology is type 2 (curved). There is trace fluid in the subacromial subdeltoid bursa.  Glenohumeral Joint: Poor definition of the inferior glenohumeral ligament. Mildly thinned articular cartilage along the humeral head.  Labrum:  Grossly intact  Bones: Osseous metastatic lesion involving the upper half of the glenoid and extending into the base of the coracoid and partially into the coracoid itself, with associated periostitis. The abnormal edema tracks in the adjacent soft tissues and a small component of extraosseous spread is not readily excluded given this appearance. There are small bony metastatic lesions in the proximal humeral metaphysis on the left side, largest 1.2 cm in diameter on image 12 series 5, as well as a small metastatic lesion in the humeral head. On image 5 of series 5, the question of a nondisplaced pathologic coracoid base fracture is raised, but is not considered certain and not well seen on the sagittal T1 weighted images.  Soft tissues: Extensive confluent axillary adenopathy, significantly increased from prior exams, short axis diameter of 1 lymph node at 2.6 cm on image 4 series 7. Small enhancing lesion between the trapezius and supraspinatus muscles on image 5 series 8, likely a lymph node or small metastatic focus.  IMPRESSION: 1. Osseous metastatic involvement of the glenoid and coracoid, with periostitis and possible mild extraosseous extension of tumor causing the regional soft tissue edema. The possibility of a nondisplaced pathologic coracoid base fracture is difficult to totally exclude given the surrounding edema in the soft tissues. 2. Metastatic  lesions in the proximal humerus and extensive new left axillary adenopathy compatible with progression of malignancy. Small node between the trapezius and supraspinatus muscles likely malignant. 3. Poor definition of the inferior glenohumeral ligament, probably not torn. 4. With regard to the possibility of adhesive capsulitis, there is some edema below the coracoid which can be an associated finding, but I feel that this edema is most likely related to the underlying tumor and periostitis rather than separate synovitis. 5. Mild supraspinatus tendinopathy. Trace subacromial subdeltoid bursitis.   Electronically Signed   By: Van Clines M.D.   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 nondisplaced pathologic coracoid base fracture, lesions in proximal humerus, ext left axillary lymphadenopathy  Radiology review: MRI of the shoulder which was done for shoulder pain revealed extensive metastatic disease involving the bones involved in the right shoulder. Unfortunately this suggests relapse  of her metastatic disease.  Plan: 1. Obtain PET CT scan for restaging 2. Referral to radiation oncology for palliative radiation the shoulder pain gets worse. Patient reports that currently the pain is not significant. Especially with taking ibuprofen or Norco. 3. start palliative systemic chemotherapy with Halaven 12/22/2014 D1 day 8 every 3 weeks. Plan for 6 cycles of chemotherapy with CT scans after 3 cycles and PET CT scan after 6 cycles  Prognosis discussion: I discussed with her that her cancer is extremely aggressive and has already metastasized in spite of being on oral anti-estrogen therapy. I do not feel comfortable in treating her with more anti-estrogen type therapies. Once she finishes chemotherapy we might consider letrozole with Ibrance as a possible maintenance approach. Patient understands fully well that stage IV breast cancer cannot be cured. Her prognosis would depend on response to treatment as well as her performance status along with extent of disease.  Bone metastases: Given the new finding of metastases, with the start of chemotherapy she will also start receiving bisphosphonates.  Return to clinic after radiation is complete to start palliative chemotherapy with Halaven.   Orders Placed This Encounter  Procedures  . NM PET Image Restag (PS) Skull Base To Thigh    Standing Status: Future     Number of Occurrences:      Standing Expiration Date: 12/15/2015    Order Specific Question:  Reason for Exam (SYMPTOM  OR DIAGNOSIS REQUIRED)    Answer:  metastatic breast cancer restaging    Order Specific Question:  Is the patient pregnant?    Answer:  No    Order Specific Question:  Preferred imaging location?    Answer:  [facility]  . CBC with Differential    Standing Status: Standing     Number of Occurrences: 20     Standing Expiration Date: 12/16/2015  . Comprehensive metabolic panel    Standing Status: Standing     Number of Occurrences: 20     Standing  Expiration Date: 12/16/2015   The patient has a good understanding of the overall plan. she agrees with it. she will call with any problems that may develop before the next visit here.   Rulon Eisenmenger, MD

## 2014-12-15 NOTE — Telephone Encounter (Signed)
Gave avs & calendar for September °

## 2014-12-15 NOTE — Assessment & Plan Note (Signed)
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 nondisplaced pathologic coracoid base fracture, lesions in proximal humerus, ext left axillary lymphadenopathy  Radiology review: MRI of the shoulder which was done for shoulder pain revealed extensive metastatic disease involving the bones involved in the right shoulder. Unfortunately this suggests relapse of her metastatic disease.  Plan: 1. Obtain PET CT scan for restaging 2. Referral to radiation oncology for palliative radiation to the shoulder for pain control 3. Following radiation palliative systemic chemotherapy with Halaven.  Prognosis discussion: I discussed with her that her cancer is extremely aggressive and has already metastasized in spite of being on oral anti-estrogen therapy. I do not feel comfortable in treating her with more anti-estrogen type therapies. Once she finishes chemotherapy we might consider letrozole with Ibrance as a possible maintenance approach. Patient understands fully well that stage IV breast cancer cannot be cured. Her prognosis would depend on response to treatment as well as her performance status along with  extent of disease.  Bone metastases: Given the new finding of metastases, with the start of chemotherapy she will also start receiving bisphosphonates.  Return to clinic after radiation is complete to start palliative chemotherapy with Halaven.

## 2014-12-15 NOTE — Progress Notes (Signed)
Mammogram and ultrasound dtd 12/13/14 rcvd from [facility].  Reviewed by Dr. Lindi Adie.  Sent to scan.

## 2014-12-15 NOTE — Addendum Note (Signed)
Addended by: Prentiss Bells on: 12/15/2014 10:25 AM   Modules accepted: Medications

## 2014-12-17 ENCOUNTER — Encounter: Payer: Self-pay | Admitting: Nurse Practitioner

## 2014-12-17 NOTE — Assessment & Plan Note (Addendum)
Patient is status post chemotherapy completed in January 2016, mastectomy performed in February 2016, and radiation therapy was completed in May 2016.  Patient continues to take anastrozole on a daily basis.  Patient is complaining of progressive, chronic left shoulder pain that occasionally radiates down her entire left side.  She states the pain is worse with movement of her left shoulder and when she lies on her left side.  She denies any actual chest pain, chest pressure, or shortness of breath.  MRI of the left shoulder obtained yesterday revealed:  IMPRESSION: 1. Osseous metastatic involvement of the glenoid and coracoid, with periostitis and possible mild extraosseous extension of tumor causing the regional soft tissue edema. The possibility of a nondisplaced pathologic coracoid base fracture is difficult to totally exclude given the surrounding edema in the soft tissues. 2. Metastatic lesions in the proximal humerus and extensive new left axillary adenopathy compatible with progression of malignancy. Small node between the trapezius and supraspinatus muscles likely malignant. 3. Poor definition of the inferior glenohumeral ligament, probably not torn. 4. With regard to the possibility of adhesive capsulitis, there is some edema below the coracoid which can be an associated finding, but I feel that this edema is most likely related to the underlying tumor and periostitis rather than separate synovitis.  Reviewed MRI results with both patient and her husband today.  Patient requested and was given a refill of her hydrocodone today.  Patient states she also has oxycodone at home; but she was advised not to take the hydrocodone if she is taking the oxycodone.  Patient will return to the [facility] tomorrow to review MRI results in more detail with Dr. Lindi Adie; and to discuss her future plan of care.

## 2014-12-17 NOTE — Progress Notes (Signed)
SYMPTOM MANAGEMENT CLINIC   HPI: Karen Douglas 59 y.o. female diagnosed with breast cancer.    Patient is status post chemotherapy completed in January 2016, mastectomy performed in February 2016, and radiation therapy was completed in May 2016.  Patient continues to take anastrozole on a daily basis.  Patient is complaining of progressive, chronic left shoulder pain that occasionally radiates down her entire left side.  She states the pain is worse with movement of her left shoulder and when she lies on her left side.  She denies any actual chest pain, chest pressure, or shortness of breath.  MRI of the left shoulder obtained yesterday revealed:  IMPRESSION: 1. Osseous metastatic involvement of the glenoid and coracoid, with periostitis and possible mild extraosseous extension of tumor causing the regional soft tissue edema. The possibility of a nondisplaced pathologic coracoid base fracture is difficult to totally exclude given the surrounding edema in the soft tissues. 2. Metastatic lesions in the proximal humerus and extensive new left axillary adenopathy compatible with progression of malignancy. Small node between the trapezius and supraspinatus muscles likely malignant. 3. Poor definition of the inferior glenohumeral ligament, probably not torn. 4. With regard to the possibility of adhesive capsulitis, there is some edema below the coracoid which can be an associated finding, but I feel that this edema is most likely related to the underlying tumor and periostitis rather than separate synovitis.  Reviewed MRI results with both patient and her husband today.  Patient requested and was given a refill of her hydrocodone today.  Patient states she also has oxycodone at home; but she was advised not to take the hydrocodone if she is taking the oxycodone.  Patient will return to the [facility] tomorrow to review MRI results in more detail with Dr. Lindi Adie; and to discuss her  future plan of care. HPI  ROS  Past Medical History  Diagnosis Date  . Headache   . Cancer 11/28/13 bx    breast  . Breast cancer 11/28/13    right  invasive mammary ca, metastatic, mammary ca in situ  . 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 Status post chemotherapy     Completed 6 cycles of Taxotere Cytoxan.    Past Surgical History  Procedure Laterality Date  . Finger surgery    . Portacath placement N/A 12/19/2013    Procedure: INSERTION PORT-A-CATH;  Surgeon: Autumn Messing III, MD;  Location: [facility];  Service: General;  Laterality: N/A;  . Mastectomy modified radical Right 05/15/2014    Procedure: RIGHT MASTECTOMY MODIFIED RADICAL;  Surgeon: Autumn Messing III, MD;  Location: [facility];  Service: General;  Laterality: Right;  . Abdominal hysterectomy  1988    cysts on ovaries    has Breast cancer of 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; Arm edema; Shoulder pain; and Lymphedema of upper extremity on her problem list.    has No Known Allergies.    Medication List       This list is accurate as of: 12/14/14 11:59 PM.  Always use your most recent med list.               anastrozole 1 MG tablet  Commonly known as:  ARIMIDEX  Take 1 tablet (1 mg total) by mouth daily.     atorvastatin 20 MG tablet  Commonly known as:  LIPITOR  Take 20 mg by mouth daily.     calcium carbonate 600 MG Tabs tablet  Commonly  known as:  OS-CAL  Take 600 mg by mouth daily with breakfast.     HYDROcodone-acetaminophen 5-325 MG per tablet  Commonly known as:  NORCO/VICODIN  Take 1-2 tablets by mouth every 6 (six) hours as needed for moderate pain.     penicillin v potassium 500 MG tablet  Commonly known as:  VEETID  Take 1 tablet (500 mg total) by mouth 4 (four) times daily.     polyethylene glycol packet  Commonly known as:  MIRALAX / GLYCOLAX  Take 17 g by mouth daily as needed for mild constipation.         PHYSICAL EXAMINATION  Oncology Vitals 12/15/2014 12/14/2014 12/13/2014 12/06/2014  11/12/2014 10/19/2014 09/21/2014  Height 155 cm 155 cm 155 cm 159 cm 159 cm 155 cm -  Weight 77.338 kg 78.336 kg 79.833 kg 80.06 kg 81.285 kg 82.872 kg 84.142 kg  Weight (lbs) 170 lbs 8 oz 172 lbs 11 oz 176 lbs 176 lbs 8 oz 179 lbs 3 oz 182 lbs 11 oz 185 lbs 8 oz  BMI (kg/m2) 32.22 kg/m2 32.63 kg/m2 33.25 kg/m2 31.77 kg/m2 32.25 kg/m2 34.52 kg/m2 -  Temp 98.1 98.7 - 98.4 98.1 98.4 97.8  Pulse 88 110 - 92 78 84 83  Resp 17 18 - 18 18 19 18   SpO2 99 96 - 97 95 99 100  BSA (m2) 1.82 m2 1.84 m2 1.85 m2 1.88 m2 1.89 m2 1.89 m2 -   BP Readings from Last 3 Encounters:  12/15/14 142/94  12/14/14 121/69  12/06/14 112/69    Physical Exam  Constitutional: She is oriented to person, place, and time and well-developed, well-nourished, and in no distress.  HENT:  Head: Normocephalic and atraumatic.  Eyes: Conjunctivae and EOM are normal. Pupils are equal, round, and reactive to light. Right eye exhibits no discharge. Left eye exhibits no discharge. No scleral icterus.  Neck: Normal range of motion.  Pulmonary/Chest: Effort normal. No respiratory distress.  Musculoskeletal: Normal range of motion. She exhibits tenderness. She exhibits no edema.  Left shoulder tenderness with any palpation and/or movement.  Neurological: She is alert and oriented to person, place, and time. Gait normal.  Psychiatric:  Tearful.  Nursing note and vitals reviewed.   LABORATORY DATA:. No visits with results within 3 Day(s) from this visit. Latest known visit with results is:  Appointment on 08/15/2014  Component Date Value Ref Range Status  . WBC 08/15/2014 3.0* 3.9 - 10.3 10e3/uL Final  . NEUT# 08/15/2014 2.1  1.5 - 6.5 10e3/uL Final  . HGB 08/15/2014 11.6  11.6 - 15.9 g/dL Final  . HCT 08/15/2014 36.3  34.8 - 46.6 % Final  . Platelets 08/15/2014 252  145 - 400 10e3/uL Final  . MCV 08/15/2014 82.5  79.5 - 101.0 fL Final  . MCH 08/15/2014 26.4  25.1 - 34.0 pg Final  . MCHC 08/15/2014 32.0  31.5 - 36.0 g/dL Final   . RBC 08/15/2014 4.40  3.70 - 5.45 10e6/uL Final  . RDW 08/15/2014 19.3* 11.2 - 14.5 % Final  . lymph# 08/15/2014 0.4* 0.9 - 3.3 10e3/uL Final  . MONO# 08/15/2014 0.4  0.1 - 0.9 10e3/uL Final  . Eosinophils Absolute 08/15/2014 0.1  0.0 - 0.5 10e3/uL Final  . Basophils Absolute 08/15/2014 0.0  0.0 - 0.1 10e3/uL Final  . NEUT% 08/15/2014 70.2  38.4 - 76.8 % Final  . LYMPH% 08/15/2014 12.4* 14.0 - 49.7 % Final  . MONO% 08/15/2014 14.5* 0.0 - 14.0 % Final  . EOS%  08/15/2014 2.2  0.0 - 7.0 % Final  . BASO% 08/15/2014 0.7  0.0 - 2.0 % Final  . Estradiol, Ultra Sensitive 08/15/2014 7   Final   Comment:  Adult Female Reference Ranges for Estradiol,  Ultrasensitive, LC/MS/MS:   Follicular Phase:     12-248  pg/mL  Luteal Phase:         48-440  pg/mL  Postmenopausal Phase: < or = 10 pg/mL  Pediatric Female Reference Ranges for Estradiol,  Ultrasensitive,  LC/MS/MS:   Pre-pubertal    (1-9 years):     < or = 16 pg/mL  10-11 years:       < or = 65 pg/mL  12-14 years:       < or = 142 pg/mL  15-17 years:       < or = 283 pg/mL   . [facility] 08/15/2014 48.4   Final   Comment: Reference Ranges:         Female:                         1.4 -  18.1 mIU/mL         Female:   Follicular Phase    2.5 -  10.2 mIU/mL                   MidCycle Peak       3.4 -  33.4 mIU/mL                   Luteal Phase        1.5 -   9.1 mIU/mL                    Post Menopausal    23.0 - 116.3 mIU/mL                   Pregnant                <   0.3 mIU/mL      RADIOGRAPHIC STUDIES: Mr Shoulder Left Wo Contrast  12/14/2014   CLINICAL DATA:  Frozen left shoulder for 2 weeks. History of breast cancer.  EXAM: MRI OF THE LEFT SHOULDER WITHOUT CONTRAST  TECHNIQUE: Multiplanar, multisequence MR imaging of the shoulder was performed. No intravenous contrast was administered.  COMPARISON:  02/27/2015  FINDINGS: Despite efforts by the technologist and patient, motion artifact is present on today's exam and could not be eliminated. This reduces exam  sensitivity and specificity.  Rotator cuff:  Mild supraspinatus tendinopathy.  Muscles:  Unremarkable  Biceps long head:  Unremarkable  Acromioclavicular Joint: Mild degenerative arthropathy. Subacromial morphology is type 2 (curved). There is trace fluid in the subacromial subdeltoid bursa.  Glenohumeral Joint: Poor definition of the inferior glenohumeral ligament. Mildly thinned articular cartilage along the humeral head.  Labrum:  Grossly intact  Bones: Osseous metastatic lesion involving the upper half of the glenoid and extending into the base of the coracoid and partially into the coracoid itself, with associated periostitis. The abnormal edema tracks in the adjacent soft tissues and a small component of extraosseous spread is not readily excluded given this appearance. There are small bony metastatic lesions in the proximal humeral metaphysis on the left side, largest 1.2 cm in diameter on image 12 series 5, as well as a small metastatic lesion in the humeral head. On image 5 of series 5, the question of a nondisplaced pathologic coracoid base fracture is  raised, but is not considered certain and not well seen on the sagittal T1 weighted images.  Soft tissues: Extensive confluent axillary adenopathy, significantly increased from prior exams, short axis diameter of 1 lymph node at 2.6 cm on image 4 series 7. Small enhancing lesion between the trapezius and supraspinatus muscles on image 5 series 8, likely a lymph node or small metastatic focus.  IMPRESSION: 1. Osseous metastatic involvement of the glenoid and coracoid, with periostitis and possible mild extraosseous extension of tumor causing the regional soft tissue edema. The possibility of a nondisplaced pathologic coracoid base fracture is difficult to totally exclude given the surrounding edema in the soft tissues. 2. Metastatic lesions in the proximal humerus and extensive new left axillary adenopathy compatible with progression of malignancy. Small node  between the trapezius and supraspinatus muscles likely malignant. 3. Poor definition of the inferior glenohumeral ligament, probably not torn. 4. With regard to the possibility of adhesive capsulitis, there is some edema below the coracoid which can be an associated finding, but I feel that this edema is most likely related to the underlying tumor and periostitis rather than separate synovitis. 5. Mild supraspinatus tendinopathy. Trace subacromial subdeltoid bursitis.   Electronically Signed   By: Van Clines M.D.   On: 12/14/2014 08:24    ASSESSMENT/PLAN:    Breast cancer of 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 Patient is status post chemotherapy completed in January 2016, mastectomy performed in February 2016, and radiation therapy was completed in May 2016.  Patient continues to take anastrozole on a daily basis.  Patient is complaining of progressive, chronic left shoulder pain that occasionally radiates down her entire left side.  She states the pain is worse with movement of her left shoulder and when she lies on her left side.  She denies any actual chest pain, chest pressure, or shortness of breath.  MRI of the left shoulder obtained yesterday revealed:  IMPRESSION: 1. Osseous metastatic involvement of the glenoid and coracoid, with periostitis and possible mild extraosseous extension of tumor causing the regional soft tissue edema. The possibility of a nondisplaced pathologic coracoid base fracture is difficult to totally exclude given the surrounding edema in the soft tissues. 2. Metastatic lesions in the proximal humerus and extensive new left axillary adenopathy compatible with progression of malignancy. Small node between the trapezius and supraspinatus muscles likely malignant. 3. Poor definition of the inferior glenohumeral ligament, probably not torn. 4. With regard to the possibility of adhesive capsulitis, there is some edema below the coracoid which can be an  associated finding, but I feel that this edema is most likely related to the underlying tumor and periostitis rather than separate synovitis.  Reviewed MRI results with both patient and her husband today.  Patient requested and was given a refill of her hydrocodone today.  Patient states she also has oxycodone at home; but she was advised not to take the hydrocodone if she is taking the oxycodone.  Patient will return to the [facility] tomorrow to review MRI results in more detail with Dr. Lindi Adie; and to discuss her future plan of care.       Patient stated understanding of all instructions; and was in agreement with this plan of care. The patient knows to call the clinic with any problems, questions or concerns.   Review/collaboration with Dr. Lindi Adie regarding all aspects of patient's visit today.   Total time spent with patient was 25 minutes;  with greater than 75 percent of that time spent in  face to face counseling regarding patient's symptoms,  and coordination of care and follow up.  Disclaimer:This dictation was prepared with Dragon/digital dictation along with [employer]. Any transcriptional errors that result from this process are unintentional.  Drue Second, NP 12/17/2014

## 2014-12-18 ENCOUNTER — Telehealth: Payer: Self-pay | Admitting: Hematology and Oncology

## 2014-12-18 ENCOUNTER — Encounter (HOSPITAL_COMMUNITY)
Admission: RE | Admit: 2014-12-18 | Discharge: 2014-12-18 | Disposition: A | Discharge status: Home / Self Care | Payer: Commercial Managed Care - HMO | Source: Ambulatory Visit | Attending: Hematology and Oncology | Admitting: Hematology and Oncology

## 2014-12-18 DIAGNOSIS — C50511 Malignant neoplasm of 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: Secondary | ICD-10-CM | POA: Insufficient documentation

## 2014-12-18 LAB — GLUCOSE, CAPILLARY: Glucose-Capillary: 88 mg/dL (ref 65–99)

## 2014-12-18 MED ORDER — FLUDEOXYGLUCOSE F - 18 (FDG) INJECTION
8.4600 | Freq: Once | INTRAVENOUS | Status: DC | PRN
  Administered 2014-12-18: 8.46 via INTRAVENOUS
  Filled 2014-12-18: qty 8.46

## 2014-12-18 NOTE — Telephone Encounter (Signed)
Appointments added per pof and patient will get a new schedule 9/12

## 2014-12-19 ENCOUNTER — Telehealth: Payer: Self-pay

## 2014-12-19 NOTE — Telephone Encounter (Signed)
Mammogram results dtd 12/13/14 rcvd from [facility].  Reviewed by Dr. Lindi Adie.  Sent to scan.

## 2014-12-21 NOTE — Assessment & Plan Note (Signed)
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 nondisplaced pathologic coracoid base fracture, lesions in proximal humerus, ext left axillary lymphadenopathy PET CT 9/12: Widespread progression, Left Axill LN, Sub pectoralis LN, Bil Hilar LN,  RUL Lung nodule, RML nodule, RUL nodule, Rt Effusion, Musculature Rt chest wall, Bone mets, Liver mets  Plan: Palliative systemic chemotherapy with [place] 12/22/2014 D1 day 8 every 3 weeks. Plan for 6 cycles of chemotherapy with CT scans after 3 cycles and PET CT scan after 6 cycles  Bone Mets: Zometa  RTC in 1 week for tox check

## 2014-12-22 ENCOUNTER — Encounter: Payer: Self-pay | Admitting: Hematology and Oncology

## 2014-12-22 ENCOUNTER — Ambulatory Visit (HOSPITAL_BASED_OUTPATIENT_CLINIC_OR_DEPARTMENT_OTHER): Payer: Commercial Managed Care - HMO

## 2014-12-22 ENCOUNTER — Ambulatory Visit: Payer: Commercial Managed Care - HMO

## 2014-12-22 ENCOUNTER — Telehealth: Payer: Self-pay | Admitting: *Deleted

## 2014-12-22 ENCOUNTER — Other Ambulatory Visit (HOSPITAL_BASED_OUTPATIENT_CLINIC_OR_DEPARTMENT_OTHER): Payer: Commercial Managed Care - HMO

## 2014-12-22 ENCOUNTER — Ambulatory Visit (HOSPITAL_BASED_OUTPATIENT_CLINIC_OR_DEPARTMENT_OTHER): Payer: Commercial Managed Care - HMO | Admitting: Hematology and Oncology

## 2014-12-22 VITALS — BP 137/88 | HR 68 | Temp 97.0°F | Resp 18 | Ht 61.0 in | Wt 171.1 lb

## 2014-12-22 DIAGNOSIS — Z170 Estrogen receptor positive status [ER+]: Secondary | ICD-10-CM

## 2014-12-22 DIAGNOSIS — C50511 Malignant neoplasm of 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: Secondary | ICD-10-CM

## 2014-12-22 DIAGNOSIS — C7951 Secondary malignant neoplasm of bone: Secondary | ICD-10-CM | POA: Insufficient documentation

## 2014-12-22 DIAGNOSIS — C7802 Secondary malignant neoplasm of left lung: Secondary | ICD-10-CM | POA: Diagnosis not present

## 2014-12-22 DIAGNOSIS — Z5111 Encounter for antineoplastic chemotherapy: Secondary | ICD-10-CM

## 2014-12-22 DIAGNOSIS — C773 Secondary and unspecified malignant neoplasm of axilla and upper limb lymph nodes: Secondary | ICD-10-CM

## 2014-12-22 DIAGNOSIS — C787 Secondary malignant neoplasm of liver and intrahepatic bile duct: Secondary | ICD-10-CM | POA: Insufficient documentation

## 2014-12-22 DIAGNOSIS — C7801 Secondary malignant neoplasm of right lung: Secondary | ICD-10-CM | POA: Diagnosis not present

## 2014-12-22 DIAGNOSIS — Z95828 Presence of other vascular implants and grafts: Secondary | ICD-10-CM

## 2014-12-22 LAB — COMPREHENSIVE METABOLIC PANEL (CC13)
ALBUMIN: 3.4 g/dL — AB (ref 3.5–5.0)
ALK PHOS: 123 U/L (ref 40–150)
ALT: 24 U/L (ref 0–55)
ANION GAP: 10 meq/L (ref 3–11)
AST: 51 U/L — AB (ref 5–34)
BUN: 14.2 mg/dL (ref 7.0–26.0)
CALCIUM: 9.7 mg/dL (ref 8.4–10.4)
CHLORIDE: 109 meq/L (ref 98–109)
CO2: 23 mEq/L (ref 22–29)
CREATININE: 0.8 mg/dL (ref 0.6–1.1)
EGFR: 88 mL/min/{1.73_m2} — ABNORMAL LOW (ref >90)
Glucose: 81 mg/dl (ref 70–140)
Potassium: 4.2 mEq/L (ref 3.5–5.1)
Sodium: 141 mEq/L (ref 136–145)
Total Bilirubin: 0.53 mg/dL (ref 0.20–1.20)
Total Protein: 7 g/dL (ref 6.4–8.3)

## 2014-12-22 LAB — CBC WITH DIFFERENTIAL/PLATELET
BASO%: 0.3 % (ref 0.0–2.0)
BASOS ABS: 0 10*3/uL (ref 0.0–0.1)
EOS%: 1 % (ref 0.0–7.0)
Eosinophils Absolute: 0 10*3/uL (ref 0.0–0.5)
HEMATOCRIT: 34 % — AB (ref 34.8–46.6)
HEMOGLOBIN: 11.2 g/dL — AB (ref 11.6–15.9)
LYMPH#: 0.6 10*3/uL — AB (ref 0.9–3.3)
LYMPH%: 16.4 % (ref 14.0–49.7)
MCH: 27.1 pg (ref 25.1–34.0)
MCHC: 32.9 g/dL (ref 31.5–36.0)
MCV: 82.1 fL (ref 79.5–101.0)
MONO#: 0.5 10*3/uL (ref 0.1–0.9)
MONO%: 11.7 % (ref 0.0–14.0)
NEUT#: 2.7 10*3/uL (ref 1.5–6.5)
NEUT%: 70.6 % (ref 38.4–76.8)
PLATELETS: 295 10*3/uL (ref 145–400)
RBC: 4.14 10*6/uL (ref 3.70–5.45)
RDW: 17.5 % — AB (ref 11.2–14.5)
WBC: 3.8 10*3/uL — ABNORMAL LOW (ref 3.9–10.3)

## 2014-12-22 MED ORDER — PALONOSETRON HCL INJECTION 0.25 MG/5ML
INTRAVENOUS | Status: AC
  Filled 2014-12-22: qty 5

## 2014-12-22 MED ORDER — SODIUM CHLORIDE 0.9 % IV SOLN
1.3600 mg/m2 | Freq: Once | INTRAVENOUS | Status: AC
  Administered 2014-12-22: 2.5 mg via INTRAVENOUS
  Filled 2014-12-22: qty 5

## 2014-12-22 MED ORDER — ZOLEDRONIC ACID 4 MG/100ML IV SOLN
4.0000 mg | Freq: Once | INTRAVENOUS | Status: AC
  Administered 2014-12-22: 4 mg via INTRAVENOUS
  Filled 2014-12-22: qty 100

## 2014-12-22 MED ORDER — PALONOSETRON HCL INJECTION 0.25 MG/5ML
0.2500 mg | Freq: Once | INTRAVENOUS | Status: AC
  Administered 2014-12-22: 0.25 mg via INTRAVENOUS

## 2014-12-22 MED ORDER — SODIUM CHLORIDE 0.9 % IV SOLN
Freq: Once | INTRAVENOUS | Status: AC
  Administered 2014-12-22: 11:00:00 via INTRAVENOUS

## 2014-12-22 MED ORDER — HEPARIN SOD (PORK) LOCK FLUSH 100 UNIT/ML IV SOLN
500.0000 [IU] | Freq: Once | INTRAVENOUS | Status: AC | PRN
  Administered 2014-12-22: 500 [IU]
  Filled 2014-12-22: qty 5

## 2014-12-22 MED ORDER — SODIUM CHLORIDE 0.9 % IV SOLN
Freq: Once | INTRAVENOUS | Status: AC
  Administered 2014-12-22: 11:00:00 via INTRAVENOUS
  Filled 2014-12-22: qty 1

## 2014-12-22 MED ORDER — SODIUM CHLORIDE 0.9 % IJ SOLN
10.0000 mL | INTRAMUSCULAR | Status: DC | PRN
  Administered 2014-12-22: 10 mL via INTRAVENOUS
  Filled 2014-12-22: qty 10

## 2014-12-22 MED ORDER — SODIUM CHLORIDE 0.9 % IJ SOLN
10.0000 mL | INTRAMUSCULAR | Status: DC | PRN
  Administered 2014-12-22: 10 mL
  Filled 2014-12-22: qty 10

## 2014-12-22 NOTE — Telephone Encounter (Signed)
Called [facility] at [place] after receipt of patient voicemail.  She has lost zofran and asking if she is to continue taking anastrazole.  [facility] says [facility] 12-15-2014 and insurance is not going to cover refill until 12-27-2014. Called Karen Douglas and notified her of this.  Advised she search the home, retrace steps.  She has enough on hand to use until she can obtain refill.  Reviewed directions for compazine and zofran.  Instructed that she is to stay on the anastrozole at least five to ten years.  No further questions.

## 2014-12-22 NOTE — Progress Notes (Signed)
Patient Care Team: Gerald Hill, MD as PCP - General (Family Medicine) Paul Toth III, MD as Consulting Physician (General Surgery) Vinay Gudena, MD as Consulting Physician (Hematology and Oncology) Robert Murray, MD as Consulting Physician (Radiation Oncology)  DIAGNOSIS: Breast cancer of 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   Staging form: Breast, AJCC 7th Edition     Clinical: Stage IV (T4b, N1, M1) - Signed by Vinay K Gudena, MD on 12/30/2013     Pathologic: No stage assigned - Unsigned   SUMMARY OF ONCOLOGIC HISTORY:   Breast cancer of 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   12/01/2013 Initial Diagnosis Breast cancer of 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   12/06/2013 Breast MRI Enhancing tumor throughout the right breast all quadrants with diffuse skin thickening 11.5 x 11 x 5 cm: Spiculated mass posterior UOQ right breast 2.6 x 2.5 x 2 cm and abuts pectoralis muscle: 2 abnormal axillary lymph nodes largest 2.2 cm   12/20/2013 PET scan Stage IV disease noted with 1. Diffuse hypermetabolic right breast inflammatory carcinoma.  Metastatic lymphadenopathy in the right axilla, mediastinum, and bilateral hilar regions, small bilateral pulmonary metastases, possible 9th rib metastases   12/30/2013 - 04/14/2014 Chemotherapy Taxotere and cytoxan given on day 1 of a 21 day cycle with neulasta given on day 2 for granulocyte support.  A total of 6 cycles are planned with CT chest and mammo/ultrasound planned after cycle 3.    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 Murray to chest wall and axilla   08/23/2014 -  Anti-estrogen oral therapy Anastrozole 1 mg daily   12/14/2014 Imaging MRI right shoulder: Bone metastases involving the glenoid and coracoid periostitis and possible extraosseous ext of tumor causing soft tissue edema, ? nondisplaced pathologic coracoid base fracture, lesions in proximal humerus, ext left axillary lymphaden   12/14/2014 Relapse/Recurrence Left Axill LN Biopsy: Metastatic cancer Er 20%, PR 0%, Her 2 Neg   12/18/2014 PET scan Widespread progression, Left Axill LN, Sub pectoralis LN, Bil Hilar LN,  RUL Lung nodule, RML nodule, RUL nodule, Rt Effusion, Musculature Rt chest wall, Bone mets, Liver mets   12/22/2014 -  Chemotherapy Palliative chemotherapy with Halaven day 1 day 8 every 3 weeks    CHIEF COMPLIANT: Follow-up to review PET CT scan and to start chemotherapy  INTERVAL HISTORY: Karen Douglas is a 59-year-old with above-mentioned history of widespread metastatic disease from breast cancer that is ER weakly positive PR negative and HER-2 negative. She is here today to start first round of chemotherapy. She is also here to review the PET CT scan. That CT scan showed widespread diffuse metastatic disease involving bones, lung, liver, lymph nodes and chest wall. Surprisingly she is not in much pain or discomfort. She is accompanied today by her husband. She is quite distressed and saddened about her   current situation.  REVIEW OF SYSTEMS:   Constitutional: Denies fevers, chills or abnormal weight loss Eyes: Denies blurriness of vision Ears, nose, mouth, throat, and face: Denies mucositis or sore throat Respiratory: Denies cough, dyspnea or  wheezes Cardiovascular: Denies palpitation, chest discomfort or lower extremity swelling Gastrointestinal:  Denies nausea, heartburn or change in bowel habits Skin: Denies abnormal skin rashes Lymphatics: Denies new lymphadenopathy or easy bruising Neurological:Denies numbness, tingling or new weaknesses Behavioral/Psych: Very sad and depressed Breast: Right chest wall recurrence, palpable lymphadenopathy in the left axilla All other systems were reviewed with the patient and are negative.  I have reviewed the past medical history, past surgical history, social history and family history with the patient and they are unchanged from previous note.  ALLERGIES:  has No Known Allergies.  MEDICATIONS:  Current Outpatient Prescriptions  Medication Sig Dispense Refill  . anastrozole (ARIMIDEX) 1 MG tablet Take 1 tablet (1 mg total) by mouth daily. 90 tablet 3  . atorvastatin (LIPITOR) 20 MG tablet Take 20 mg by mouth daily.    . calcium carbonate (OS-CAL) 600 MG TABS tablet Take 600 mg by mouth daily with breakfast.    . HYDROcodone-acetaminophen (NORCO/VICODIN) 5-325 MG per tablet Take 1-2 tablets by mouth every 6 (six) hours as needed for moderate pain. 30 tablet 0  . lidocaine-prilocaine (EMLA) cream Apply to affected area once 30 g 3  . ondansetron (ZOFRAN) 8 MG tablet Take 1 tablet (8 mg total) by mouth 2 (two) times daily. Start the day after chemo for 2 days. Then take as needed for nausea or vomiting. 30 tablet 1  . oxyCODONE-acetaminophen (PERCOCET/ROXICET) 5-325 MG per tablet Take by mouth every 4 (four) hours as needed. for pain  0  . penicillin v potassium (VEETID) 500 MG tablet Take 1 tablet (500 mg total) by mouth 4 (four) times daily. 56 tablet 0  . polyethylene glycol (MIRALAX / GLYCOLAX) packet Take 17 g by mouth daily as needed for mild constipation.    . prochlorperazine (COMPAZINE) 10 MG tablet Take 1 tablet (10 mg total) by mouth every 6 (six) hours as needed (Nausea or  vomiting). 30 tablet 1   No current facility-administered medications for this visit.   Facility-Administered Medications Ordered in Other Visits  Medication Dose Route Frequency Provider Last Rate Last Dose  . fludeoxyglucose F - 18 (FDG) injection 8.46 milli Curie  8.46 milli Curie Intravenous Once PRN Medication Radiologist, MD   8.46 milli Curie at 12/18/14 1049    PHYSICAL EXAMINATION: ECOG PERFORMANCE STATUS: 1 - Symptomatic but completely ambulatory  Filed Vitals:   12/22/14 1006  BP: 137/88  Pulse: 68  Temp: 97 F (36.1 C)  Resp: 18   Filed Weights   12/22/14 1006  Weight: 171 lb 1.6 oz (77.61 kg)    GENERAL:alert, no distress and comfortable SKIN: skin color, texture, turgor are normal, no rashes or significant lesions EYES: normal, Conjunctiva are pink and non-injected, sclera clear OROPHARYNX:no exudate, no erythema and lips, buccal mucosa, and tongue normal  NECK: supple, thyroid normal size, non-tender, without nodularity LYMPH:  no palpable lymphadenopathy in the cervical, axillary or inguinal LUNGS: clear to auscultation and percussion with normal breathing effort HEART: regular rate & rhythm and no murmurs and no lower extremity edema ABDOMEN:abdomen soft, non-tender and normal bowel sounds Musculoskeletal:no cyanosis of digits and no clubbing  NEURO: alert & oriented x 3 with fluent speech, no focal motor/sensory deficits  LABORATORY DATA:  I have reviewed the data as listed     Chemistry      Component Value Date/Time   NA 141 12/22/2014 0952   NA 140 05/17/2014 0544   K 4.2 12/22/2014 0952   K 4.1 05/17/2014 0544   CL 111 05/17/2014 0544   CO2 23 12/22/2014 0952   CO2 24 05/17/2014 0544   BUN 14.2 12/22/2014 0952   BUN 13 05/17/2014 0544   CREATININE 0.8 12/22/2014 0952   CREATININE 0.81 05/17/2014 0544      Component Value Date/Time   CALCIUM 9.7 12/22/2014 0952   CALCIUM 8.3* 05/17/2014 0544   ALKPHOS 123 12/22/2014 0952   AST 51*  12/22/2014 0952   ALT 24 12/22/2014 0952   BILITOT 0.53 12/22/2014 0952       Lab Results  Component Value Date   WBC 3.8* 12/22/2014   HGB 11.2* 12/22/2014   HCT 34.0* 12/22/2014   MCV 82.1 12/22/2014   PLT 295 12/22/2014   NEUTROABS 2.7 12/22/2014   ASSESSMENT & PLAN:  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: By ultrasound 2.3 cm 6:00 position, 2.5 cm at 9:00 position, 3.1 cm retroareolar, total size 11.5 cm by MRI extending to underneath the skin and the dermis with enlarged lymph nodes biopsy proven breast cancer clinical stage TIV, N1, M1 stage IV ER/PR positive HER-2 negative Ki-67 95% status post neoadjuvant chemotherapy with Taxotere and Cytoxan 6 cycles followed by right mastectomy 05/15/2014: 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 nondisplaced pathologic coracoid base fracture, lesions in proximal humerus, ext left axillary lymphadenopathy  PET CT 9/12: Widespread progression, Left Axill LN, Sub pectoralis LN, Bil Hilar LN,  RUL Lung nodule, RML nodule, RUL nodule, Rt Effusion, Musculature Rt chest wall, Bone mets, Liver mets  Current treatment Plan: Palliative systemic chemotherapy with Halaven 12/22/2014 D1 day 8 every 3 weeks. Plan for 6 cycles of chemotherapy with CT scans after 3 cycles and PET CT scan after 6 cycles  Bone Mets: Zometa  RTC in 1 week for tox check     No orders of the defined types were placed in this encounter.   The patient has a good understanding of the overall plan. she agrees with it. she will call with  any problems that may develop before the next visit here.   Gudena, Vinay K, MD     The right and 

## 2014-12-22 NOTE — Patient Instructions (Signed)

## 2014-12-22 NOTE — Assessment & Plan Note (Signed)
Zometa every 3 weeks with calcium and vitamin D

## 2014-12-22 NOTE — Patient Instructions (Addendum)
[facility] Discharge Instructions for Patients Receiving Chemotherapy  Today you received the following chemotherapy agents: Halaven   To help prevent nausea and vomiting after your treatment, we encourage you to take your nausea medication as directed.   If you develop nausea and vomiting that is not controlled by your nausea medication, call the clinic.   BELOW ARE SYMPTOMS THAT SHOULD BE REPORTED IMMEDIATELY:  *FEVER GREATER THAN 100.5 F  *CHILLS WITH OR WITHOUT FEVER  NAUSEA AND VOMITING THAT IS NOT CONTROLLED WITH YOUR NAUSEA MEDICATION  *UNUSUAL SHORTNESS OF BREATH  *UNUSUAL BRUISING OR BLEEDING  TENDERNESS IN MOUTH AND THROAT WITH OR WITHOUT PRESENCE OF ULCERS  *URINARY PROBLEMS  *BOWEL PROBLEMS  UNUSUAL RASH Items with * indicate a potential emergency and should be followed up as soon as possible.  Feel free to call the clinic you have any questions or concerns. The clinic phone number is [phone].  Please show the [place] at check-in to the Emergency Department and triage nurse.   Eribulin solution for injection What is this medicine? ERIBULIN is a chemotherapy drug. It is used to treat breast cancer. This medicine may be used for other purposes; ask your health care provider or pharmacist if you have questions. COMMON BRAND NAME(S): Halaven What should I tell my health care provider before I take this medicine? They need to know if you have any of these conditions: -heart disease -kidney disease -liver disease -low blood counts, like low white cell, platelet, or red cell counts -an unusual or allergic reaction to eribulin, other medicines, foods, dyes, or preservatives -pregnant or trying to get pregnant -breast-feeding How should I use this medicine? This medicine is for infusion into a vein. It is given by a health care professional in a hospital or clinic setting. Talk to your pediatrician regarding the use of this medicine  in children. Special care may be needed. Overdosage: If you think you've taken too much of this medicine contact a poison control center or emergency room at once. Overdosage: If you think you have taken too much of this medicine contact a poison control center or emergency room at once. NOTE: This medicine is only for you. Do not share this medicine with others. What if I miss a dose? It is important not to miss your dose. Call your doctor or health care professional if you are unable to keep an appointment. What may interact with this medicine? Do not take this medicine with any of the following medications: -amiodarone -astemizole -arsenic trioxide -bepridil -bretylium -chloroquine -chlorpromazine -cisapride -clarithromycin -dextromethorphan, quinidine -disopyramide -dofetilide -droperidol -dronedarone -erythromycin -grepafloxacin -halofantrine -haloperidol -ibutilide -levomethadyl -mesoridazine -methadone -pentamidine -procainamide -quinidine -pimozide -posaconazole -probucol -propafenone -saquinavir -sotalol -sparfloxacin -terfenadine -thioridazine -troleandomycin -ziprasidone This list may not describe all possible interactions. Give your health care provider a list of all the medicines, herbs, non-prescription drugs, or dietary supplements you use. Also tell them if you smoke, drink alcohol, or use illegal drugs. Some items may interact with your medicine. What should I watch for while using this medicine? Your condition will be monitored carefully while you are receiving this medicine. This drug may make you feel generally unwell. This is not uncommon, as chemotherapy can affect healthy cells as well as cancer cells. Report any side effects. Continue your course of treatment even though you feel ill unless your doctor tells you to stop. Call your doctor or health care professional for advice if you get a fever, chills or sore throat, or  other symptoms of a cold  or flu. Do not treat yourself. This drug decreases your body's ability to fight infections. Try to avoid being around people who are sick. This medicine may increase your risk to bruise or bleed. Call your doctor or health care professional if you notice any unusual bleeding. Be careful brushing and flossing your teeth or using a toothpick because you may get an infection or bleed more easily. If you have any dental work done, tell your dentist you are receiving this medicine. Avoid taking products that contain aspirin, acetaminophen, ibuprofen, naproxen, or ketoprofen unless instructed by your doctor. These medicines may hide a fever. Do not become pregnant while taking this medicine. Women should inform their doctor if they wish to become pregnant or think they might be pregnant. There is a potential for serious side effects to an unborn child. Talk to your health care professional or pharmacist for more information. Do not breast-feed an infant while taking this medicine. What side effects may I notice from receiving this medicine? Side effects that you should report to your doctor or health care professional as soon as possible: -allergic reactions like skin rash, itching or hives, swelling of the face, lips, or tongue -low blood counts - this medicine may decrease the number of white blood cells, red blood cells and platelets. You may be at increased risk for infections and bleeding. -signs of infection - fever or chills, cough, sore throat, pain or difficulty passing urine -signs of decreased platelets or bleeding - bruising, pinpoint red spots on the skin, black, tarry stools, blood in the urine -signs of decreased red blood cells - unusually weak or tired, fainting spells, lightheadedness -pain, tingling, numbness in the hands or feet Side effects that usually do not require medical attention (Report these to your doctor or health care professional if they continue or are  bothersome.): -constipation -hair loss -headache -loss of appetite -muscle or joint pain -nausea, vomiting -stomach pain This list may not describe all possible side effects. Call your doctor for medical advice about side effects. You may report side effects to FDA at 1-800-FDA-1088. Where should I keep my medicine? This drug is given in a hospital or clinic and will not be stored at home. NOTE: This sheet is a summary. It may not cover all possible information. If you have questions about this medicine, talk to your doctor, pharmacist, or health care provider.  2015, Elsevier/Gold Standard. (2009-03-15 23:04:37)   Zoledronic Acid injection (Hypercalcemia, Oncology) What is this medicine? ZOLEDRONIC ACID (ZOE le dron ik AS id) lowers the amount of calcium loss from bone. It is used to treat too much calcium in your blood from cancer. It is also used to prevent complications of cancer that has spread to the bone. This medicine may be used for other purposes; ask your health care provider or pharmacist if you have questions. COMMON BRAND NAME(S): Zometa What should I tell my health care provider before I take this medicine? They need to know if you have any of these conditions: -aspirin-sensitive asthma -cancer, especially if you are receiving medicines used to treat cancer -dental disease or wear dentures -infection -kidney disease -receiving corticosteroids like dexamethasone or prednisone -an unusual or allergic reaction to zoledronic acid, other medicines, foods, dyes, or preservatives -pregnant or trying to get pregnant -breast-feeding How should I use this medicine? This medicine is for infusion into a vein. It is given by a health care professional in a hospital or clinic setting. Talk  to your pediatrician regarding the use of this medicine in children. Special care may be needed. Overdosage: If you think you have taken too much of this medicine contact a poison control center  or emergency room at once. NOTE: This medicine is only for you. Do not share this medicine with others. What if I miss a dose? It is important not to miss your dose. Call your doctor or health care professional if you are unable to keep an appointment. What may interact with this medicine? -certain antibiotics given by injection -NSAIDs, medicines for pain and inflammation, like ibuprofen or naproxen -some diuretics like bumetanide, furosemide -teriparatide -thalidomide This list may not describe all possible interactions. Give your health care provider a list of all the medicines, herbs, non-prescription drugs, or dietary supplements you use. Also tell them if you smoke, drink alcohol, or use illegal drugs. Some items may interact with your medicine. What should I watch for while using this medicine? Visit your doctor or health care professional for regular checkups. It may be some time before you see the benefit from this medicine. Do not stop taking your medicine unless your doctor tells you to. Your doctor may order blood tests or other tests to see how you are doing. Women should inform their doctor if they wish to become pregnant or think they might be pregnant. There is a potential for serious side effects to an unborn child. Talk to your health care professional or pharmacist for more information. You should make sure that you get enough calcium and vitamin D while you are taking this medicine. Discuss the foods you eat and the vitamins you take with your health care professional. Some people who take this medicine have severe bone, joint, and/or muscle pain. This medicine may also increase your risk for jaw problems or a broken thigh bone. Tell your doctor right away if you have severe pain in your jaw, bones, joints, or muscles. Tell your doctor if you have any pain that does not go away or that gets worse. Tell your dentist and dental surgeon that you are taking this medicine. You should  not have major dental surgery while on this medicine. See your dentist to have a dental exam and fix any dental problems before starting this medicine. Take good care of your teeth while on this medicine. Make sure you see your dentist for regular follow-up appointments. What side effects may I notice from receiving this medicine? Side effects that you should report to your doctor or health care professional as soon as possible: -allergic reactions like skin rash, itching or hives, swelling of the face, lips, or tongue -anxiety, confusion, or depression -breathing problems -changes in vision -eye pain -feeling faint or lightheaded, falls -jaw pain, especially after dental work -mouth sores -muscle cramps, stiffness, or weakness -trouble passing urine or change in the amount of urine Side effects that usually do not require medical attention (report to your doctor or health care professional if they continue or are bothersome): -bone, joint, or muscle pain -constipation -diarrhea -fever -hair loss -irritation at site where injected -loss of appetite -nausea, vomiting -stomach upset -trouble sleeping -trouble swallowing -weak or tired This list may not describe all possible side effects. Call your doctor for medical advice about side effects. You may report side effects to FDA at 1-800-FDA-1088. Where should I keep my medicine? This drug is given in a hospital or clinic and will not be stored at home. NOTE: This sheet is a summary. It  may not cover all possible information. If you have questions about this medicine, talk to your doctor, pharmacist, or health care provider.  2015, Elsevier/Gold Standard. (2012-09-02 13:03:13)

## 2014-12-25 ENCOUNTER — Ambulatory Visit: Payer: Commercial Managed Care - HMO | Admitting: Hematology and Oncology

## 2014-12-27 ENCOUNTER — Telehealth: Payer: Self-pay

## 2014-12-27 NOTE — Telephone Encounter (Signed)
Mammogram and ultrasound results dtd 12/14/14 rcvd from [facility].  Reviewed by Dr. Lindi Adie.  Sent to scan.

## 2014-12-28 NOTE — Assessment & Plan Note (Signed)
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 nondisplaced pathologic coracoid base fracture, lesions in proximal humerus, ext left axillary lymphadenopathy  PET CT 9/12: Widespread progression, Left Axill LN, Sub pectoralis LN, Bil Hilar LN, RUL Lung nodule, RML nodule, RUL nodule, Rt Effusion, Musculature Rt chest wall, Bone mets, Liver mets  Current treatment Plan: Palliative systemic chemotherapy with Halaven 12/22/2014 D1 day 8 every 3 weeks. Plan for 6 cycles of chemotherapy with CT scans after 3 cycles and PET CT scan after 6 cycles  Bone Mets: Zometa  RTC in 2 weeks for cycle 2 

## 2014-12-29 ENCOUNTER — Telehealth: Payer: Self-pay | Admitting: Hematology and Oncology

## 2014-12-29 ENCOUNTER — Ambulatory Visit (HOSPITAL_BASED_OUTPATIENT_CLINIC_OR_DEPARTMENT_OTHER): Payer: Commercial Managed Care - HMO | Admitting: Hematology and Oncology

## 2014-12-29 ENCOUNTER — Ambulatory Visit: Payer: Commercial Managed Care - HMO

## 2014-12-29 ENCOUNTER — Encounter: Payer: Self-pay | Admitting: Hematology and Oncology

## 2014-12-29 ENCOUNTER — Other Ambulatory Visit (HOSPITAL_BASED_OUTPATIENT_CLINIC_OR_DEPARTMENT_OTHER): Payer: Commercial Managed Care - HMO

## 2014-12-29 ENCOUNTER — Ambulatory Visit (HOSPITAL_BASED_OUTPATIENT_CLINIC_OR_DEPARTMENT_OTHER): Payer: Commercial Managed Care - HMO

## 2014-12-29 VITALS — BP 138/83 | HR 85 | Temp 98.6°F | Resp 18 | Ht 61.0 in | Wt 170.2 lb

## 2014-12-29 DIAGNOSIS — C50511 Malignant neoplasm of 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: Secondary | ICD-10-CM

## 2014-12-29 DIAGNOSIS — C7951 Secondary malignant neoplasm of bone: Secondary | ICD-10-CM

## 2014-12-29 DIAGNOSIS — Z95828 Presence of other vascular implants and grafts: Secondary | ICD-10-CM

## 2014-12-29 DIAGNOSIS — C787 Secondary malignant neoplasm of liver and intrahepatic bile duct: Secondary | ICD-10-CM

## 2014-12-29 DIAGNOSIS — C773 Secondary and unspecified malignant neoplasm of axilla and upper limb lymph nodes: Secondary | ICD-10-CM

## 2014-12-29 DIAGNOSIS — Z5111 Encounter for antineoplastic chemotherapy: Secondary | ICD-10-CM | POA: Diagnosis not present

## 2014-12-29 DIAGNOSIS — M25512 Pain in left shoulder: Secondary | ICD-10-CM | POA: Diagnosis not present

## 2014-12-29 DIAGNOSIS — D701 Agranulocytosis secondary to cancer chemotherapy: Secondary | ICD-10-CM

## 2014-12-29 LAB — COMPREHENSIVE METABOLIC PANEL (CC13)
ALBUMIN: 3.5 g/dL (ref 3.5–5.0)
ALK PHOS: 110 U/L (ref 40–150)
ALT: 79 U/L — ABNORMAL HIGH (ref 0–55)
AST: 82 U/L — ABNORMAL HIGH (ref 5–34)
Anion Gap: 9 mEq/L (ref 3–11)
BUN: 4.7 mg/dL — ABNORMAL LOW (ref 7.0–26.0)
CO2: 25 mEq/L (ref 22–29)
Calcium: 9.2 mg/dL (ref 8.4–10.4)
Chloride: 108 mEq/L (ref 98–109)
Creatinine: 0.8 mg/dL (ref 0.6–1.1)
EGFR: 90 mL/min/{1.73_m2} — AB (ref >90)
GLUCOSE: 118 mg/dL (ref 70–140)
POTASSIUM: 4.4 meq/L (ref 3.5–5.1)
SODIUM: 141 meq/L (ref 136–145)
Total Bilirubin: 0.49 mg/dL (ref 0.20–1.20)
Total Protein: 7.1 g/dL (ref 6.4–8.3)

## 2014-12-29 LAB — CBC WITH DIFFERENTIAL/PLATELET
BASO%: 0.8 % (ref 0.0–2.0)
BASOS ABS: 0 10*3/uL (ref 0.0–0.1)
EOS ABS: 0 10*3/uL (ref 0.0–0.5)
EOS%: 0.9 % (ref 0.0–7.0)
HCT: 33.3 % — ABNORMAL LOW (ref 34.8–46.6)
HEMOGLOBIN: 10.8 g/dL — AB (ref 11.6–15.9)
LYMPH%: 29 % (ref 14.0–49.7)
MCH: 27 pg (ref 25.1–34.0)
MCHC: 32.5 g/dL (ref 31.5–36.0)
MCV: 83.1 fL (ref 79.5–101.0)
MONO#: 0.2 10*3/uL (ref 0.1–0.9)
MONO%: 12.4 % (ref 0.0–14.0)
NEUT#: 1.1 10*3/uL — ABNORMAL LOW (ref 1.5–6.5)
NEUT%: 56.9 % (ref 38.4–76.8)
Platelets: 324 10*3/uL (ref 145–400)
RBC: 4 10*6/uL (ref 3.70–5.45)
RDW: 18.1 % — AB (ref 11.2–14.5)
WBC: 2 10*3/uL — ABNORMAL LOW (ref 3.9–10.3)
lymph#: 0.6 10*3/uL — ABNORMAL LOW (ref 0.9–3.3)

## 2014-12-29 MED ORDER — SODIUM CHLORIDE 0.9 % IV SOLN
Freq: Once | INTRAVENOUS | Status: AC
  Administered 2014-12-29: 13:00:00 via INTRAVENOUS
  Filled 2014-12-29: qty 1

## 2014-12-29 MED ORDER — PALONOSETRON HCL INJECTION 0.25 MG/5ML
0.2500 mg | Freq: Once | INTRAVENOUS | Status: AC
  Administered 2014-12-29: 0.25 mg via INTRAVENOUS

## 2014-12-29 MED ORDER — HEPARIN SOD (PORK) LOCK FLUSH 100 UNIT/ML IV SOLN
500.0000 [IU] | Freq: Once | INTRAVENOUS | Status: AC | PRN
  Administered 2014-12-29: 500 [IU]
  Filled 2014-12-29: qty 5

## 2014-12-29 MED ORDER — SODIUM CHLORIDE 0.9 % IV SOLN
1.3700 mg/m2 | Freq: Once | INTRAVENOUS | Status: AC
  Administered 2014-12-29: 2.5 mg via INTRAVENOUS
  Filled 2014-12-29: qty 5

## 2014-12-29 MED ORDER — SODIUM CHLORIDE 0.9 % IJ SOLN
10.0000 mL | INTRAMUSCULAR | Status: DC | PRN
  Administered 2014-12-29: 10 mL
  Filled 2014-12-29: qty 10

## 2014-12-29 MED ORDER — SODIUM CHLORIDE 0.9 % IV SOLN
Freq: Once | INTRAVENOUS | Status: AC
  Administered 2014-12-29: 13:00:00 via INTRAVENOUS

## 2014-12-29 MED ORDER — PALONOSETRON HCL INJECTION 0.25 MG/5ML
INTRAVENOUS | Status: AC
  Filled 2014-12-29: qty 5

## 2014-12-29 MED ORDER — SODIUM CHLORIDE 0.9 % IJ SOLN
10.0000 mL | INTRAMUSCULAR | Status: DC | PRN
  Administered 2014-12-29: 10 mL via INTRAVENOUS
  Filled 2014-12-29: qty 10

## 2014-12-29 NOTE — Progress Notes (Signed)
Per Dr. Lindi Adie, pt is ok to treat with ANC 1.1.  MD aware of of pt AST/ALT.

## 2014-12-29 NOTE — Patient Instructions (Signed)

## 2014-12-29 NOTE — Patient Instructions (Signed)
[facility] Discharge Instructions for Patients Receiving Chemotherapy  Today you received the following chemotherapy agents: Halaven  To help prevent nausea and vomiting after your treatment, we encourage you to take your nausea medication as directed.    If you develop nausea and vomiting that is not controlled by your nausea medication, call the clinic.   BELOW ARE SYMPTOMS THAT SHOULD BE REPORTED IMMEDIATELY:  *FEVER GREATER THAN 100.5 F  *CHILLS WITH OR WITHOUT FEVER  NAUSEA AND VOMITING THAT IS NOT CONTROLLED WITH YOUR NAUSEA MEDICATION  *UNUSUAL SHORTNESS OF BREATH  *UNUSUAL BRUISING OR BLEEDING  TENDERNESS IN MOUTH AND THROAT WITH OR WITHOUT PRESENCE OF ULCERS  *URINARY PROBLEMS  *BOWEL PROBLEMS  UNUSUAL RASH Items with * indicate a potential emergency and should be followed up as soon as possible.  Feel free to call the clinic you have any questions or concerns. The clinic phone number is [phone].  Please show the CHEMO ALERT CARD at check-in to the Emergency Department and triage nurse.   

## 2014-12-29 NOTE — Progress Notes (Signed)
Reviewed labd with Karna Christmas, RN, Dr. Geralyn Flash nurse. OKay to treat per Terri RN, Dr. Geralyn Flash nurse.

## 2014-12-29 NOTE — Progress Notes (Signed)
Patient Care Team: Iona Beard, MD as PCP - General (Family Medicine) Autumn Messing III, MD as Consulting Physician (General Surgery) Nicholas Lose, MD as Consulting Physician (Hematology and Oncology) Arloa Koh, MD as Consulting Physician (Radiation Oncology)  DIAGNOSIS: Breast cancer of 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   Staging form: Breast, AJCC 7th Edition     Clinical: Stage IV (T4b, N1, M1) - Signed by Rulon Eisenmenger, MD on 12/30/2013     Pathologic: No stage assigned - Unsigned   SUMMARY OF ONCOLOGIC HISTORY:   Breast cancer of 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   12/01/2013 Initial Diagnosis Breast cancer of 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   12/06/2013 Breast MRI Enhancing tumor throughout the right breast all quadrants with diffuse skin thickening 11.5 x 11 x 5 cm: Spiculated mass posterior UOQ right breast 2.6 x 2.5 x 2 cm and abuts pectoralis muscle: 2 abnormal axillary lymph nodes largest 2.2 cm   12/20/2013 PET scan Stage IV disease noted with 1. Diffuse hypermetabolic right breast inflammatory carcinoma.  Metastatic lymphadenopathy in the right axilla, mediastinum, and bilateral hilar regions, small bilateral pulmonary metastases, possible 9th rib metastases   12/30/2013 - 04/14/2014 Chemotherapy Taxotere and cytoxan given on day 1 of a 21 day cycle with neulasta given on day 2 for granulocyte support.  A total of 6 cycles are planned with CT chest and mammo/ultrasound planned after cycle 3.    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 Valere Dross to chest wall and axilla   08/23/2014 -  Anti-estrogen oral therapy Anastrozole 1 mg daily   12/14/2014 Imaging MRI right shoulder: Bone metastases involving the glenoid and coracoid periostitis and possible extraosseous ext of tumor causing soft tissue edema, ? nondisplaced pathologic coracoid base fracture, lesions in proximal humerus, ext left axillary lymphaden   12/14/2014 Relapse/Recurrence Left Axill LN Biopsy: Metastatic cancer Er 20%, PR 0%, Her 2 Neg   12/18/2014 PET scan Widespread progression, Left Axill LN, Sub pectoralis LN, Bil Hilar LN,  RUL Lung nodule, RML nodule, RUL nodule, Rt Effusion, Musculature Rt chest wall, Bone mets, Liver mets   12/22/2014 -  Chemotherapy Palliative chemotherapy with Halaven day 1 day 8 every 3 weeks    CHIEF COMPLIANT: Cycle 1 day 8 Halaven  INTERVAL HISTORY: Karen Douglas is a 59 year old with above-mentioned history of metastatic breast cancer currently on palliative chemotherapy with Halaven. She tolerated cycle 1 day 1 extremely well. She reported mild fatigue as well as decreased appetite. Denies any nausea or vomiting. Denies any neuropathy. In fact she felt that the left axillary lymph node is much smaller. Her right shoulder pain is almost gone.  REVIEW OF SYSTEMS:   Constitutional: Denies fevers, chills or abnormal weight loss Eyes: Denies blurriness of vision Ears, nose, mouth, throat, and face: Denies mucositis or  sore throat Respiratory: Denies cough, dyspnea or wheezes Cardiovascular: Denies palpitation, chest discomfort or lower extremity swelling Gastrointestinal:  Denies nausea, heartburn or change in bowel habits Skin: Denies abnormal skin  rashes Lymphatics: Denies new lymphadenopathy or easy bruising Neurological:Denies numbness, tingling or new weaknesses Behavioral/Psych: Mood is stable, no new changes  Breast: Resolution of left axillary lymphadenopathy All other systems were reviewed with the patient and are negative.  I have reviewed the past medical history, past surgical history, social history and family history with the patient and they are unchanged from previous note.  ALLERGIES:  has No Known Allergies.  MEDICATIONS:  Current Outpatient Prescriptions  Medication Sig Dispense Refill  . anastrozole (ARIMIDEX) 1 MG tablet Take 1 tablet (1 mg total) by mouth daily. 90 tablet 3  . atorvastatin (LIPITOR) 20 MG tablet Take 20 mg by mouth daily.    . calcium carbonate (OS-CAL) 600 MG TABS tablet Take 600 mg by mouth daily with breakfast.    . HYDROcodone-acetaminophen (NORCO/VICODIN) 5-325 MG per tablet Take 1-2 tablets by mouth every 6 (six) hours as needed for moderate pain. 30 tablet 0  . lidocaine-prilocaine (EMLA) cream Apply to affected area once 30 g 3  . ondansetron (ZOFRAN) 8 MG tablet Take 1 tablet (8 mg total) by mouth 2 (two) times daily. Start the day after chemo for 2 days. Then take as needed for nausea or vomiting. 30 tablet 1  . oxyCODONE-acetaminophen (PERCOCET/ROXICET) 5-325 MG per tablet Take by mouth every 4 (four) hours as needed. for pain  0  . penicillin v potassium (VEETID) 500 MG tablet Take 1 tablet (500 mg total) by mouth 4 (four) times daily. 56 tablet 0  . polyethylene glycol (MIRALAX / GLYCOLAX) packet Take 17 g by mouth daily as needed for mild constipation.    . prochlorperazine (COMPAZINE) 10 MG tablet Take 1 tablet (10 mg total) by mouth every 6 (six) hours as needed (Nausea or vomiting). 30 tablet 1   No current facility-administered medications for this visit.    PHYSICAL EXAMINATION: ECOG PERFORMANCE STATUS: 1 - Symptomatic but completely ambulatory  Filed Vitals:   12/29/14  1130  BP: 138/83  Pulse: 85  Temp: 98.6 F (37 C)  Resp: 18   Filed Weights   12/29/14 1130  Weight: 170 lb 3.2 oz (77.202 kg)    GENERAL:alert, no distress and comfortable SKIN: skin color, texture, turgor are normal, no rashes or significant lesions EYES: normal, Conjunctiva are pink and non-injected, sclera clear OROPHARYNX:no exudate, no erythema and lips, buccal mucosa, and tongue normal  NECK: supple, thyroid normal size, non-tender, without nodularity LYMPH:  no palpable lymphadenopathy in the cervical, axillary or inguinal LUNGS: clear to auscultation and percussion with normal breathing effort HEART: regular rate & rhythm and no murmurs and no lower extremity edema ABDOMEN:abdomen soft, non-tender and normal bowel sounds Musculoskeletal:no cyanosis of digits and no clubbing  NEURO: alert & oriented x 3 with fluent speech, no focal motor/sensory deficits  LABORATORY DATA:  I have reviewed the data as listed   Chemistry      Component Value Date/Time   NA 141 12/22/2014 0952   NA 140 05/17/2014 0544   K 4.2 12/22/2014 0952   K 4.1 05/17/2014 0544   CL 111 05/17/2014 0544   CO2 23 12/22/2014 0952   CO2 24 05/17/2014 0544   BUN 14.2 12/22/2014 0952   BUN 13 05/17/2014 0544   CREATININE 0.8 12/22/2014 0952   CREATININE 0.81 05/17/2014 0544  Component Value Date/Time   CALCIUM 9.7 12/22/2014 0952   CALCIUM 8.3* 05/17/2014 0544   ALKPHOS 123 12/22/2014 0952   AST 51* 12/22/2014 0952   ALT 24 12/22/2014 0952   BILITOT 0.53 12/22/2014 0952       Lab Results  Component Value Date   WBC 2.0* 12/29/2014   HGB 10.8* 12/29/2014   HCT 33.3* 12/29/2014   MCV 83.1 12/29/2014   PLT 324 12/29/2014   NEUTROABS 1.1* 12/29/2014   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 by MRI extending to underneath the skin and the dermis with enlarged lymph nodes biopsy  proven breast cancer clinical stage T4, N1, M1 stage IV ER/PR positive HER-2 negative Ki-67 95% status post neoadjuvant chemotherapy with Taxotere and Cytoxan 6 cycles followed by right mastectomy 05/15/2014: 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 Plan for 6 cycles of chemotherapy with CT scans after 3 cycles and PET CT scan after 6 cycles  Chemotherapy toxicities: 1. Mild to moderate fatigue 2. Decreased appetite  3. Neutropenia grade 1: ANC 1.1 4. Anemia due to chemotherapy: Hemoglobin 10.8  Patient reports that she no longer feels the left axillary lymph node.  We need to give her Neulasta injection. She prefers not to get ONPRO Neulasta because of her husband's issues with it. We will have her come back on Monday for Neulasta injection. It would be better for her to get the subsequent chemotherapies only on Friday so that she can get the Neulasta injection on Saturday morning.    Bone Mets: Zometa with calcium and vitamin D  RTC in 2 weeks for cycle 2 No orders of the defined types were placed in this encounter.   The patient has a good understanding of the overall plan. she agrees with it. she will call with any problems that may develop before the next visit here.   Rulon Eisenmenger, MD

## 2014-12-29 NOTE — Telephone Encounter (Signed)
Appointments made and avs printed for patient °

## 2015-01-01 ENCOUNTER — Ambulatory Visit (HOSPITAL_BASED_OUTPATIENT_CLINIC_OR_DEPARTMENT_OTHER): Payer: Commercial Managed Care - HMO

## 2015-01-01 VITALS — BP 109/63 | HR 105 | Temp 98.6°F

## 2015-01-01 DIAGNOSIS — C50511 Malignant neoplasm of 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: Secondary | ICD-10-CM

## 2015-01-01 DIAGNOSIS — C773 Secondary and unspecified malignant neoplasm of axilla and upper limb lymph nodes: Secondary | ICD-10-CM | POA: Diagnosis not present

## 2015-01-01 DIAGNOSIS — Z5189 Encounter for other specified aftercare: Secondary | ICD-10-CM

## 2015-01-01 DIAGNOSIS — C787 Secondary malignant neoplasm of liver and intrahepatic bile duct: Secondary | ICD-10-CM | POA: Diagnosis not present

## 2015-01-01 DIAGNOSIS — C7951 Secondary malignant neoplasm of bone: Secondary | ICD-10-CM

## 2015-01-01 MED ORDER — PEGFILGRASTIM INJECTION 6 MG/0.6ML ~~LOC~~
6.0000 mg | PREFILLED_SYRINGE | Freq: Once | SUBCUTANEOUS | Status: AC
  Administered 2015-01-01: 6 mg via SUBCUTANEOUS
  Filled 2015-01-01: qty 0.6

## 2015-01-02 ENCOUNTER — Telehealth: Payer: Self-pay | Admitting: *Deleted

## 2015-01-02 NOTE — Telephone Encounter (Signed)
Received [place] report from [place], sent to scan.

## 2015-01-12 ENCOUNTER — Ambulatory Visit (HOSPITAL_BASED_OUTPATIENT_CLINIC_OR_DEPARTMENT_OTHER): Payer: Commercial Managed Care - HMO | Admitting: Hematology and Oncology

## 2015-01-12 ENCOUNTER — Telehealth: Payer: Self-pay | Admitting: Hematology and Oncology

## 2015-01-12 ENCOUNTER — Encounter: Payer: Self-pay | Admitting: Hematology and Oncology

## 2015-01-12 ENCOUNTER — Ambulatory Visit (HOSPITAL_BASED_OUTPATIENT_CLINIC_OR_DEPARTMENT_OTHER): Payer: Commercial Managed Care - HMO

## 2015-01-12 ENCOUNTER — Ambulatory Visit: Payer: Commercial Managed Care - HMO

## 2015-01-12 ENCOUNTER — Other Ambulatory Visit (HOSPITAL_BASED_OUTPATIENT_CLINIC_OR_DEPARTMENT_OTHER): Payer: Commercial Managed Care - HMO

## 2015-01-12 VITALS — BP 127/79 | HR 90 | Temp 97.8°F | Resp 18 | Ht 61.0 in | Wt 172.0 lb

## 2015-01-12 DIAGNOSIS — C50511 Malignant neoplasm of 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: Secondary | ICD-10-CM

## 2015-01-12 DIAGNOSIS — Z170 Estrogen receptor positive status [ER+]: Secondary | ICD-10-CM

## 2015-01-12 DIAGNOSIS — C7802 Secondary malignant neoplasm of left lung: Secondary | ICD-10-CM

## 2015-01-12 DIAGNOSIS — D701 Agranulocytosis secondary to cancer chemotherapy: Secondary | ICD-10-CM

## 2015-01-12 DIAGNOSIS — C7801 Secondary malignant neoplasm of right lung: Secondary | ICD-10-CM

## 2015-01-12 DIAGNOSIS — C7951 Secondary malignant neoplasm of bone: Secondary | ICD-10-CM

## 2015-01-12 DIAGNOSIS — D6481 Anemia due to antineoplastic chemotherapy: Secondary | ICD-10-CM

## 2015-01-12 DIAGNOSIS — Z95828 Presence of other vascular implants and grafts: Secondary | ICD-10-CM

## 2015-01-12 DIAGNOSIS — C773 Secondary and unspecified malignant neoplasm of axilla and upper limb lymph nodes: Secondary | ICD-10-CM | POA: Diagnosis not present

## 2015-01-12 DIAGNOSIS — Z5111 Encounter for antineoplastic chemotherapy: Secondary | ICD-10-CM | POA: Diagnosis not present

## 2015-01-12 DIAGNOSIS — C781 Secondary malignant neoplasm of mediastinum: Secondary | ICD-10-CM | POA: Diagnosis not present

## 2015-01-12 DIAGNOSIS — Z79811 Long term (current) use of aromatase inhibitors: Secondary | ICD-10-CM

## 2015-01-12 LAB — COMPREHENSIVE METABOLIC PANEL (CC13)
ALT: 37 U/L (ref 0–55)
ANION GAP: 9 meq/L (ref 3–11)
AST: 34 U/L (ref 5–34)
Albumin: 3.3 g/dL — ABNORMAL LOW (ref 3.5–5.0)
Alkaline Phosphatase: 142 U/L (ref 40–150)
BILIRUBIN TOTAL: 0.34 mg/dL (ref 0.20–1.20)
BUN: 9.7 mg/dL (ref 7.0–26.0)
CALCIUM: 9 mg/dL (ref 8.4–10.4)
CHLORIDE: 112 meq/L — AB (ref 98–109)
CO2: 20 meq/L — AB (ref 22–29)
CREATININE: 0.8 mg/dL (ref 0.6–1.1)
Glucose: 102 mg/dl (ref 70–140)
Potassium: 4.4 mEq/L (ref 3.5–5.1)
Sodium: 142 mEq/L (ref 136–145)
TOTAL PROTEIN: 6.7 g/dL (ref 6.4–8.3)

## 2015-01-12 LAB — CBC WITH DIFFERENTIAL/PLATELET
BASO%: 0.1 % (ref 0.0–2.0)
BASOS ABS: 0 10*3/uL (ref 0.0–0.1)
EOS ABS: 0 10*3/uL (ref 0.0–0.5)
EOS%: 0 % (ref 0.0–7.0)
HEMATOCRIT: 32 % — AB (ref 34.8–46.6)
HGB: 10.5 g/dL — ABNORMAL LOW (ref 11.6–15.9)
LYMPH%: 10.1 % — AB (ref 14.0–49.7)
MCH: 27.3 pg (ref 25.1–34.0)
MCHC: 32.8 g/dL (ref 31.5–36.0)
MCV: 83.1 fL (ref 79.5–101.0)
MONO#: 0.8 10*3/uL (ref 0.1–0.9)
MONO%: 4.9 % (ref 0.0–14.0)
NEUT#: 13.6 10*3/uL — ABNORMAL HIGH (ref 1.5–6.5)
NEUT%: 84.9 % — ABNORMAL HIGH (ref 38.4–76.8)
PLATELETS: 223 10*3/uL (ref 145–400)
RBC: 3.85 10*6/uL (ref 3.70–5.45)
RDW: 20.2 % — ABNORMAL HIGH (ref 11.2–14.5)
WBC: 16 10*3/uL — ABNORMAL HIGH (ref 3.9–10.3)
lymph#: 1.6 10*3/uL (ref 0.9–3.3)
nRBC: 2 % — ABNORMAL HIGH (ref 0–0)

## 2015-01-12 MED ORDER — SODIUM CHLORIDE 0.9 % IV SOLN
1.3800 mg/m2 | Freq: Once | INTRAVENOUS | Status: AC
  Administered 2015-01-12: 2.5 mg via INTRAVENOUS
  Filled 2015-01-12: qty 5

## 2015-01-12 MED ORDER — SODIUM CHLORIDE 0.9 % IV SOLN
Freq: Once | INTRAVENOUS | Status: AC
  Administered 2015-01-12: 10:00:00 via INTRAVENOUS
  Filled 2015-01-12: qty 1

## 2015-01-12 MED ORDER — HEPARIN SOD (PORK) LOCK FLUSH 100 UNIT/ML IV SOLN
500.0000 [IU] | Freq: Once | INTRAVENOUS | Status: AC | PRN
  Administered 2015-01-12: 500 [IU]
  Filled 2015-01-12: qty 5

## 2015-01-12 MED ORDER — ZOLEDRONIC ACID 4 MG/100ML IV SOLN
4.0000 mg | Freq: Once | INTRAVENOUS | Status: AC
  Administered 2015-01-12: 4 mg via INTRAVENOUS
  Filled 2015-01-12: qty 100

## 2015-01-12 MED ORDER — OXYCODONE-ACETAMINOPHEN 5-325 MG PO TABS: 1.0000 | ORAL_TABLET | Freq: Three times a day (TID) | ORAL | Status: DC | PRN

## 2015-01-12 MED ORDER — PALONOSETRON HCL INJECTION 0.25 MG/5ML
INTRAVENOUS | Status: AC
  Filled 2015-01-12: qty 5

## 2015-01-12 MED ORDER — SODIUM CHLORIDE 0.9 % IV SOLN
Freq: Once | INTRAVENOUS | Status: AC
  Administered 2015-01-12: 09:00:00 via INTRAVENOUS

## 2015-01-12 MED ORDER — PALONOSETRON HCL INJECTION 0.25 MG/5ML
0.2500 mg | Freq: Once | INTRAVENOUS | Status: AC
Start: 2015-01-12 — End: 2015-01-12
  Administered 2015-01-12: 0.25 mg via INTRAVENOUS

## 2015-01-12 MED ORDER — SODIUM CHLORIDE 0.9 % IJ SOLN
10.0000 mL | INTRAMUSCULAR | Status: DC | PRN
  Administered 2015-01-12: 10 mL
  Filled 2015-01-12: qty 10

## 2015-01-12 MED ORDER — SODIUM CHLORIDE 0.9 % IJ SOLN
10.0000 mL | Freq: Once | INTRAMUSCULAR | Status: AC
  Administered 2015-01-12: 10 mL
  Filled 2015-01-12: qty 10

## 2015-01-12 NOTE — Patient Instructions (Addendum)
[place] Discharge Instructions for Patients Receiving Chemotherapy  Today you received the following chemotherapy agents: Halaven You also received zometa for your bones.  To help prevent nausea and vomiting after your treatment, we encourage you to take your nausea medication as directed.    If you develop nausea and vomiting that is not controlled by your nausea medication, call the clinic.   BELOW ARE SYMPTOMS THAT SHOULD BE REPORTED IMMEDIATELY:  *FEVER GREATER THAN 100.5 F  *CHILLS WITH OR WITHOUT FEVER  NAUSEA AND VOMITING THAT IS NOT CONTROLLED WITH YOUR NAUSEA MEDICATION  *UNUSUAL SHORTNESS OF BREATH  *UNUSUAL BRUISING OR BLEEDING  TENDERNESS IN MOUTH AND THROAT WITH OR WITHOUT PRESENCE OF ULCERS  *URINARY PROBLEMS  *BOWEL PROBLEMS  UNUSUAL RASH Items with * indicate a potential emergency and should be followed up as soon as possible.  Feel free to call the clinic you have any questions or concerns. The clinic phone number is [phone].  Please show the [place] at check-in to the Emergency Department and triage nurse.

## 2015-01-12 NOTE — Assessment & Plan Note (Signed)
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 Plan for 6 cycles of chemotherapy with CT scans after 3 cycles and PET CT scan after 6 cycles  Chemotherapy toxicities: 1. Mild to moderate fatigue 2. Decreased appetite  3. Neutropenia grade 1: ANC 1.1 4. Anemia due to chemotherapy: Hemoglobin 10.8  Bone Mets: Zometa with calcium and vitamin D  RTC in 3 weeks for cycle 3

## 2015-01-12 NOTE — Telephone Encounter (Signed)
Appointments made and avs will be printed in chemo for patient °

## 2015-01-12 NOTE — Progress Notes (Signed)
Patient Care Team: Iona Beard, MD as PCP - General (Family Medicine) Autumn Messing III, MD as Consulting Physician (General Surgery) Nicholas Lose, MD as Consulting Physician (Hematology and Oncology) Arloa Koh, MD as Consulting Physician (Radiation Oncology)  DIAGNOSIS: Breast cancer of 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 [facility])   Staging form: Breast, AJCC 7th Edition     Clinical: Stage IV (T4b, N1, M1) - Signed by Rulon Eisenmenger, MD on 12/30/2013     Pathologic: No stage assigned - Unsigned   SUMMARY OF ONCOLOGIC HISTORY:   Breast cancer of 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 (Karen Douglas)   12/01/2013 Initial Diagnosis Breast cancer of 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   12/06/2013 Breast MRI Enhancing tumor throughout the right breast all quadrants with diffuse skin thickening 11.5 x 11 x 5 cm: Spiculated mass posterior UOQ right breast 2.6 x 2.5 x 2 cm and abuts pectoralis muscle: 2 abnormal axillary lymph nodes largest 2.2 cm   12/20/2013 PET scan Stage IV disease noted with 1. Diffuse hypermetabolic right breast inflammatory carcinoma.  Metastatic lymphadenopathy in the right axilla, mediastinum, and bilateral hilar regions, small bilateral pulmonary metastases, possible 9th rib metastases   12/30/2013 - 04/14/2014 Chemotherapy Taxotere and cytoxan given on day 1 of a 21 day cycle with neulasta given on day 2 for granulocyte support.  A total of 6 cycles are planned with CT chest and mammo/ultrasound planned after cycle 3.    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 Valere Dross to chest wall and axilla   08/23/2014 -  Anti-estrogen oral therapy Anastrozole 1 mg daily   12/14/2014 Imaging MRI right shoulder: Bone metastases involving the glenoid and coracoid periostitis and possible extraosseous ext of tumor causing soft tissue edema, ? nondisplaced pathologic coracoid base fracture, lesions in proximal humerus, ext left axillary lymphaden   12/14/2014 Relapse/Recurrence Left Axill LN Biopsy: Metastatic cancer Er 20%, PR 0%, Her 2 Neg   12/18/2014 PET scan Widespread progression, Left Axill LN, Sub pectoralis LN, Bil Hilar LN,  RUL Lung nodule, RML nodule, RUL nodule, Rt Effusion, Musculature Rt chest wall, Bone mets, Liver mets   12/22/2014 -  Chemotherapy Palliative chemotherapy with Halaven day 1 day 8 every 3 weeks    CHIEF COMPLIANT: cycle 2 Halaven  INTERVAL HISTORY: Karen Douglas is a 59 year old lady with a low mention history of metastatic breast cancer currently on palliative chemotherapy with Halaven. She appears to be tolerating the treatment fairly well except for fatigue, decreased taste. Pain in the shoulder is much better. Denies any shortness of breath. Denies any nausea vomiting. She has decreased appetite. Neulasta caused a lot of achiness in the bones which improved with pain medication.  REVIEW OF SYSTEMS:   Constitutional: Denies fevers, chills or abnormal weight loss Eyes: Denies blurriness of vision Ears, nose, mouth,  throat, and face: Denies mucositis or sore throat Respiratory: Denies cough, dyspnea or wheezes Cardiovascular: Denies palpitation, chest discomfort or lower extremity swelling Gastrointestinal:  Denies nausea, heartburn or change in  bowel habits Skin: Denies abnormal skin rashes Lymphatics: Denies new lymphadenopathy or easy bruising Neurological:Denies numbness, tingling or new weaknesses Behavioral/Psych: Mood is stable, no new changes   All other systems were reviewed with the patient and are negative.  I have reviewed the past medical history, past surgical history, social history and family history with the patient and they are unchanged from previous note.  ALLERGIES:  has No Known Allergies.  MEDICATIONS:  Current Outpatient Prescriptions  Medication Sig Dispense Refill  . anastrozole (ARIMIDEX) 1 MG tablet Take 1 tablet (1 mg total) by mouth daily. 90 tablet 3  . atorvastatin (LIPITOR) 20 MG tablet Take 20 mg by mouth daily.    . calcium carbonate (OS-CAL) 600 MG TABS tablet Take 600 mg by mouth daily with breakfast.    . HYDROcodone-acetaminophen (NORCO/VICODIN) 5-325 MG per tablet Take 1-2 tablets by mouth every 6 (six) hours as needed for moderate pain. 30 tablet 0  . lidocaine-prilocaine (EMLA) cream Apply to affected area once 30 g 3  . ondansetron (ZOFRAN) 8 MG tablet Take 1 tablet (8 mg total) by mouth 2 (two) times daily. Start the day after chemo for 2 days. Then take as needed for nausea or vomiting. 30 tablet 1  . oxyCODONE-acetaminophen (PERCOCET/ROXICET) 5-325 MG per tablet Take by mouth every 4 (four) hours as needed. for pain  0  . penicillin v potassium (VEETID) 500 MG tablet Take 1 tablet (500 mg total) by mouth 4 (four) times daily. 56 tablet 0  . polyethylene glycol (MIRALAX / GLYCOLAX) packet Take 17 g by mouth daily as needed for mild constipation.    . prochlorperazine (COMPAZINE) 10 MG tablet Take 1 tablet (10 mg total) by mouth every 6 (six) hours as needed (Nausea or vomiting). 30 tablet 1   No current facility-administered medications for this visit.    PHYSICAL EXAMINATION: ECOG PERFORMANCE STATUS: 1 - Symptomatic but completely ambulatory  Filed Vitals:   01/12/15 0826  BP:  127/79  Pulse: 90  Temp: 97.8 F (36.6 C)  Resp: 18   Filed Weights   01/12/15 0826  Weight: 172 lb (78.019 kg)    GENERAL:alert, no distress and comfortable SKIN: skin color, texture, turgor are normal, no rashes or significant lesions EYES: normal, Conjunctiva are pink and non-injected, sclera clear OROPHARYNX:no exudate, no erythema and lips, buccal mucosa, and tongue normal  NECK: supple, thyroid normal size, non-tender, without nodularity LYMPH:  no palpable lymphadenopathy in the cervical, axillary or inguinal LUNGS: clear to auscultation and percussion with normal breathing effort HEART: regular rate & rhythm and no murmurs and no lower extremity edema ABDOMEN:abdomen soft, non-tender and normal bowel sounds Musculoskeletal:no cyanosis of digits and no clubbing  NEURO: alert & oriented x 3 with fluent speech, no focal motor/sensory deficits  LABORATORY DATA:  I have reviewed the data as listed   Chemistry      Component Value Date/Time   NA 141 12/29/2014 1057   NA 140 05/17/2014 0544   K 4.4 12/29/2014 1057   K 4.1 05/17/2014 0544   CL 111 05/17/2014 0544   CO2 25 12/29/2014 1057   CO2 24 05/17/2014 0544   BUN 4.7* 12/29/2014 1057   BUN 13 05/17/2014 0544   CREATININE 0.8 12/29/2014 1057   CREATININE 0.81 05/17/2014 0544        Component Value Date/Time   CALCIUM 9.2 12/29/2014 1057   CALCIUM 8.3* 05/17/2014 0544   ALKPHOS 110 12/29/2014 1057   AST 82* 12/29/2014 1057   ALT 79* 12/29/2014 1057   BILITOT 0.49 12/29/2014 1057       Lab Results  Component Value Date   WBC 16.0* 01/12/2015   HGB 10.5* 01/12/2015   HCT 32.0* 01/12/2015   MCV 83.1 01/12/2015   PLT 223 01/12/2015   NEUTROABS 13.6* 01/12/2015    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 by MRI extending to underneath the skin and the dermis with enlarged lymph nodes biopsy proven breast  cancer clinical stage T4, N1, M1 stage IV ER/PR positive HER-2 negative Ki-67 95% status post neoadjuvant chemotherapy with Taxotere and Cytoxan 6 cycles followed by right mastectomy 05/15/2014: 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 Plan for 6 cycles of chemotherapy with CT scans after 3 cycles and PET CT scan after 6 cycles Labs were reviewed and are adequate for treatment Chemotherapy toxicities: 1. Mild to moderate fatigue 2. Decreased appetite  3. Neutropenia grade 1: ANC 1.1 4. Anemia due to chemotherapy: Hemoglobin 10.8  Bone Mets: Zometa with calcium and vitamin D  RTC in 3 weeks for cycle 3   No orders of the defined types were placed in this encounter.   The patient has a good understanding of the overall plan. she agrees with it. she will call with any problems that may develop before the next visit here.   Rulon Eisenmenger, MD

## 2015-01-13 ENCOUNTER — Ambulatory Visit: Payer: Commercial Managed Care - HMO

## 2015-01-13 NOTE — Progress Notes (Signed)
Patient did not receive injection today, per Dr. Irene Limbo (on-call).  In-basket message to desk nurse.

## 2015-01-15 ENCOUNTER — Other Ambulatory Visit: Payer: Self-pay | Admitting: *Deleted

## 2015-01-15 DIAGNOSIS — C50511 Malignant neoplasm of 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: Secondary | ICD-10-CM

## 2015-01-15 MED ORDER — HYDROCODONE-ACETAMINOPHEN 5-325 MG PO TABS: 1.0000 | ORAL_TABLET | Freq: Four times a day (QID) | ORAL | Status: DC | PRN

## 2015-01-19 ENCOUNTER — Other Ambulatory Visit: Payer: Self-pay | Admitting: Medical Oncology

## 2015-01-19 ENCOUNTER — Other Ambulatory Visit: Payer: Commercial Managed Care - HMO

## 2015-01-19 ENCOUNTER — Ambulatory Visit (HOSPITAL_BASED_OUTPATIENT_CLINIC_OR_DEPARTMENT_OTHER): Payer: Commercial Managed Care - HMO

## 2015-01-19 ENCOUNTER — Other Ambulatory Visit: Payer: Self-pay | Admitting: Hematology and Oncology

## 2015-01-19 ENCOUNTER — Ambulatory Visit: Payer: Commercial Managed Care - HMO

## 2015-01-19 ENCOUNTER — Other Ambulatory Visit (HOSPITAL_BASED_OUTPATIENT_CLINIC_OR_DEPARTMENT_OTHER): Payer: Commercial Managed Care - HMO

## 2015-01-19 VITALS — BP 95/46 | HR 66 | Temp 99.0°F | Resp 18

## 2015-01-19 DIAGNOSIS — Z5111 Encounter for antineoplastic chemotherapy: Secondary | ICD-10-CM | POA: Diagnosis not present

## 2015-01-19 DIAGNOSIS — C50511 Malignant neoplasm of 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: Secondary | ICD-10-CM | POA: Diagnosis not present

## 2015-01-19 DIAGNOSIS — D701 Agranulocytosis secondary to cancer chemotherapy: Secondary | ICD-10-CM | POA: Diagnosis not present

## 2015-01-19 DIAGNOSIS — D6481 Anemia due to antineoplastic chemotherapy: Secondary | ICD-10-CM | POA: Diagnosis not present

## 2015-01-19 DIAGNOSIS — Z95828 Presence of other vascular implants and grafts: Secondary | ICD-10-CM

## 2015-01-19 LAB — COMPREHENSIVE METABOLIC PANEL (CC13)
ALBUMIN: 3.6 g/dL (ref 3.5–5.0)
ALK PHOS: 100 U/L (ref 40–150)
ALT: 24 U/L (ref 0–55)
AST: 28 U/L (ref 5–34)
Anion Gap: 9 mEq/L (ref 3–11)
BILIRUBIN TOTAL: 0.42 mg/dL (ref 0.20–1.20)
BUN: 7.6 mg/dL (ref 7.0–26.0)
CALCIUM: 9.2 mg/dL (ref 8.4–10.4)
CO2: 22 mEq/L (ref 22–29)
CREATININE: 0.8 mg/dL (ref 0.6–1.1)
Chloride: 108 mEq/L (ref 98–109)
EGFR: >90 mL/min/{1.73_m2} (ref >90)
Glucose: 105 mg/dl (ref 70–140)
Potassium: 4.2 mEq/L (ref 3.5–5.1)
Sodium: 140 mEq/L (ref 136–145)
Total Protein: 7 g/dL (ref 6.4–8.3)

## 2015-01-19 LAB — CBC WITH DIFFERENTIAL/PLATELET
BASO%: 1.2 % (ref 0.0–2.0)
BASOS ABS: 0 10*3/uL (ref 0.0–0.1)
EOS%: 0.1 % (ref 0.0–7.0)
Eosinophils Absolute: 0 10*3/uL (ref 0.0–0.5)
HEMATOCRIT: 30.2 % — AB (ref 34.8–46.6)
HEMOGLOBIN: 9.9 g/dL — AB (ref 11.6–15.9)
LYMPH#: 0.7 10*3/uL — AB (ref 0.9–3.3)
LYMPH%: 31.6 % (ref 14.0–49.7)
MCH: 27 pg (ref 25.1–34.0)
MCHC: 32.8 g/dL (ref 31.5–36.0)
MCV: 82.4 fL (ref 79.5–101.0)
MONO#: 0.3 10*3/uL (ref 0.1–0.9)
MONO%: 15.1 % — ABNORMAL HIGH (ref 0.0–14.0)
NEUT#: 1.1 10*3/uL — ABNORMAL LOW (ref 1.5–6.5)
NEUT%: 52 % (ref 38.4–76.8)
Platelets: 250 10*3/uL (ref 145–400)
RBC: 3.67 10*6/uL — ABNORMAL LOW (ref 3.70–5.45)
RDW: 20 % — AB (ref 11.2–14.5)
WBC: 2.1 10*3/uL — ABNORMAL LOW (ref 3.9–10.3)

## 2015-01-19 MED ORDER — SODIUM CHLORIDE 0.9 % IV SOLN
Freq: Once | INTRAVENOUS | Status: AC
  Administered 2015-01-19: 11:00:00 via INTRAVENOUS

## 2015-01-19 MED ORDER — SODIUM CHLORIDE 0.9 % IJ SOLN
10.0000 mL | INTRAMUSCULAR | Status: DC | PRN
  Administered 2015-01-19: 10 mL
  Filled 2015-01-19: qty 10

## 2015-01-19 MED ORDER — PALONOSETRON HCL INJECTION 0.25 MG/5ML
INTRAVENOUS | Status: AC
  Filled 2015-01-19: qty 5

## 2015-01-19 MED ORDER — HEPARIN SOD (PORK) LOCK FLUSH 100 UNIT/ML IV SOLN
500.0000 [IU] | Freq: Once | INTRAVENOUS | Status: AC | PRN
  Administered 2015-01-19: 500 [IU]
  Filled 2015-01-19: qty 5

## 2015-01-19 MED ORDER — SODIUM CHLORIDE 0.9 % IV SOLN
Freq: Once | INTRAVENOUS | Status: AC
  Administered 2015-01-19: 11:00:00 via INTRAVENOUS
  Filled 2015-01-19: qty 1

## 2015-01-19 MED ORDER — SODIUM CHLORIDE 0.9 % IV SOLN
1.2000 mg/m2 | Freq: Once | INTRAVENOUS | Status: AC
  Administered 2015-01-19: 2.2 mg via INTRAVENOUS
  Filled 2015-01-19: qty 4.4

## 2015-01-19 MED ORDER — SODIUM CHLORIDE 0.9 % IJ SOLN
10.0000 mL | INTRAMUSCULAR | Status: DC | PRN
  Filled 2015-01-19: qty 10

## 2015-01-19 MED ORDER — PALONOSETRON HCL INJECTION 0.25 MG/5ML
0.2500 mg | Freq: Once | INTRAVENOUS | Status: AC
  Administered 2015-01-19: 0.25 mg via INTRAVENOUS

## 2015-01-19 MED ORDER — HEPARIN SOD (PORK) LOCK FLUSH 100 UNIT/ML IV SOLN
500.0000 [IU] | Freq: Once | INTRAVENOUS | Status: DC
  Filled 2015-01-19: qty 5

## 2015-01-19 MED ORDER — SODIUM CHLORIDE 0.9 % IJ SOLN
10.0000 mL | INTRAMUSCULAR | Status: DC | PRN
  Administered 2015-01-19: 10 mL via INTRAVENOUS
  Filled 2015-01-19: qty 10

## 2015-01-19 NOTE — Patient Instructions (Signed)
[place] Discharge Instructions for Patients Receiving Chemotherapy  Today you received the following chemotherapy agents: Halaven You also received zometa for your bones.  To help prevent nausea and vomiting after your treatment, we encourage you to take your nausea medication as directed.    If you develop nausea and vomiting that is not controlled by your nausea medication, call the clinic.   BELOW ARE SYMPTOMS THAT SHOULD BE REPORTED IMMEDIATELY:  *FEVER GREATER THAN 100.5 F  *CHILLS WITH OR WITHOUT FEVER  NAUSEA AND VOMITING THAT IS NOT CONTROLLED WITH YOUR NAUSEA MEDICATION  *UNUSUAL SHORTNESS OF BREATH  *UNUSUAL BRUISING OR BLEEDING  TENDERNESS IN MOUTH AND THROAT WITH OR WITHOUT PRESENCE OF ULCERS  *URINARY PROBLEMS  *BOWEL PROBLEMS  UNUSUAL RASH Items with * indicate a potential emergency and should be followed up as soon as possible.  Feel free to call the clinic you have any questions or concerns. The clinic phone number is [phone].  Please show the [place] at check-in to the Emergency Department and triage nurse.

## 2015-01-19 NOTE — Progress Notes (Signed)
OK to treat with ANC-1.1 per Dr. Lindi Adie.

## 2015-01-20 ENCOUNTER — Ambulatory Visit (HOSPITAL_BASED_OUTPATIENT_CLINIC_OR_DEPARTMENT_OTHER): Payer: Commercial Managed Care - HMO

## 2015-01-20 VITALS — BP 122/79 | HR 94 | Temp 98.2°F | Resp 18

## 2015-01-20 DIAGNOSIS — Z5189 Encounter for other specified aftercare: Secondary | ICD-10-CM | POA: Diagnosis not present

## 2015-01-20 DIAGNOSIS — C50511 Malignant neoplasm of 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: Secondary | ICD-10-CM

## 2015-01-20 MED ORDER — PEGFILGRASTIM INJECTION 6 MG/0.6ML ~~LOC~~
6.0000 mg | PREFILLED_SYRINGE | Freq: Once | SUBCUTANEOUS | Status: AC
  Administered 2015-01-20: 6 mg via SUBCUTANEOUS

## 2015-02-02 ENCOUNTER — Ambulatory Visit: Payer: Commercial Managed Care - HMO

## 2015-02-02 ENCOUNTER — Telehealth: Payer: Self-pay | Admitting: Hematology and Oncology

## 2015-02-02 ENCOUNTER — Other Ambulatory Visit: Payer: Commercial Managed Care - HMO

## 2015-02-02 ENCOUNTER — Other Ambulatory Visit: Payer: Self-pay | Admitting: *Deleted

## 2015-02-02 ENCOUNTER — Ambulatory Visit (HOSPITAL_BASED_OUTPATIENT_CLINIC_OR_DEPARTMENT_OTHER): Payer: Commercial Managed Care - HMO | Admitting: Hematology and Oncology

## 2015-02-02 ENCOUNTER — Ambulatory Visit (HOSPITAL_BASED_OUTPATIENT_CLINIC_OR_DEPARTMENT_OTHER): Payer: Commercial Managed Care - HMO

## 2015-02-02 ENCOUNTER — Ambulatory Visit: Payer: Commercial Managed Care - HMO | Admitting: Hematology and Oncology

## 2015-02-02 ENCOUNTER — Other Ambulatory Visit (HOSPITAL_BASED_OUTPATIENT_CLINIC_OR_DEPARTMENT_OTHER): Payer: Commercial Managed Care - HMO

## 2015-02-02 ENCOUNTER — Encounter: Payer: Self-pay | Admitting: Hematology and Oncology

## 2015-02-02 VITALS — BP 116/75 | HR 92 | Temp 98.8°F | Resp 18 | Ht 61.0 in | Wt 170.5 lb

## 2015-02-02 VITALS — BP 110/71 | HR 96 | Temp 99.1°F | Resp 18

## 2015-02-02 DIAGNOSIS — D6481 Anemia due to antineoplastic chemotherapy: Secondary | ICD-10-CM

## 2015-02-02 DIAGNOSIS — C787 Secondary malignant neoplasm of liver and intrahepatic bile duct: Secondary | ICD-10-CM

## 2015-02-02 DIAGNOSIS — C773 Secondary and unspecified malignant neoplasm of axilla and upper limb lymph nodes: Secondary | ICD-10-CM

## 2015-02-02 DIAGNOSIS — Z95828 Presence of other vascular implants and grafts: Secondary | ICD-10-CM

## 2015-02-02 DIAGNOSIS — C50511 Malignant neoplasm of 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: Secondary | ICD-10-CM

## 2015-02-02 DIAGNOSIS — C7802 Secondary malignant neoplasm of left lung: Secondary | ICD-10-CM

## 2015-02-02 DIAGNOSIS — C7801 Secondary malignant neoplasm of right lung: Secondary | ICD-10-CM | POA: Diagnosis not present

## 2015-02-02 DIAGNOSIS — C7951 Secondary malignant neoplasm of bone: Secondary | ICD-10-CM

## 2015-02-02 DIAGNOSIS — R530 Neoplastic (malignant) related fatigue: Secondary | ICD-10-CM

## 2015-02-02 DIAGNOSIS — Z170 Estrogen receptor positive status [ER+]: Secondary | ICD-10-CM

## 2015-02-02 DIAGNOSIS — R630 Anorexia: Secondary | ICD-10-CM

## 2015-02-02 LAB — CBC WITH DIFFERENTIAL/PLATELET
BASO%: 0.5 % (ref 0.0–2.0)
Basophils Absolute: 0 10*3/uL (ref 0.0–0.1)
EOS%: 0 % (ref 0.0–7.0)
Eosinophils Absolute: 0 10*3/uL (ref 0.0–0.5)
HCT: 30.5 % — ABNORMAL LOW (ref 34.8–46.6)
HGB: 9.9 g/dL — ABNORMAL LOW (ref 11.6–15.9)
LYMPH%: 9.7 % — AB (ref 14.0–49.7)
MCH: 27.1 pg (ref 25.1–34.0)
MCHC: 32.3 g/dL (ref 31.5–36.0)
MCV: 83.9 fL (ref 79.5–101.0)
MONO#: 0.6 10*3/uL (ref 0.1–0.9)
MONO%: 6.9 % (ref 0.0–14.0)
NEUT#: 7 10*3/uL — ABNORMAL HIGH (ref 1.5–6.5)
NEUT%: 82.9 % — AB (ref 38.4–76.8)
PLATELETS: 278 10*3/uL (ref 145–400)
RBC: 3.64 10*6/uL — AB (ref 3.70–5.45)
RDW: 22.4 % — ABNORMAL HIGH (ref 11.2–14.5)
WBC: 8.5 10*3/uL (ref 3.9–10.3)
lymph#: 0.8 10*3/uL — ABNORMAL LOW (ref 0.9–3.3)

## 2015-02-02 LAB — COMPREHENSIVE METABOLIC PANEL (CC13)
ALT: 14 U/L (ref 0–55)
ANION GAP: 8 meq/L (ref 3–11)
AST: 21 U/L (ref 5–34)
Albumin: 3.3 g/dL — ABNORMAL LOW (ref 3.5–5.0)
Alkaline Phosphatase: 105 U/L (ref 40–150)
BUN: 7.8 mg/dL (ref 7.0–26.0)
CHLORIDE: 111 meq/L — AB (ref 98–109)
CO2: 22 meq/L (ref 22–29)
CREATININE: 0.8 mg/dL (ref 0.6–1.1)
Calcium: 9.3 mg/dL (ref 8.4–10.4)
GLUCOSE: 95 mg/dL (ref 70–140)
Potassium: 4.1 mEq/L (ref 3.5–5.1)
SODIUM: 141 meq/L (ref 136–145)
Total Bilirubin: 0.4 mg/dL (ref 0.20–1.20)
Total Protein: 6.9 g/dL (ref 6.4–8.3)

## 2015-02-02 MED ORDER — PALONOSETRON HCL INJECTION 0.25 MG/5ML
0.2500 mg | Freq: Once | INTRAVENOUS | Status: AC
  Administered 2015-02-02: 0.25 mg via INTRAVENOUS

## 2015-02-02 MED ORDER — OXYCODONE-ACETAMINOPHEN 5-325 MG PO TABS: 1.0000 | ORAL_TABLET | Freq: Three times a day (TID) | ORAL | Status: DC | PRN

## 2015-02-02 MED ORDER — ZOLEDRONIC ACID 4 MG/100ML IV SOLN
4.0000 mg | Freq: Once | INTRAVENOUS | Status: AC
  Administered 2015-02-02: 4 mg via INTRAVENOUS
  Filled 2015-02-02: qty 100

## 2015-02-02 MED ORDER — SODIUM CHLORIDE 0.9 % IJ SOLN
10.0000 mL | INTRAMUSCULAR | Status: DC | PRN
  Administered 2015-02-02: 10 mL via INTRAVENOUS
  Filled 2015-02-02: qty 10

## 2015-02-02 MED ORDER — PALONOSETRON HCL INJECTION 0.25 MG/5ML
INTRAVENOUS | Status: AC
  Filled 2015-02-02: qty 5

## 2015-02-02 MED ORDER — SODIUM CHLORIDE 0.9 % IJ SOLN
10.0000 mL | INTRAMUSCULAR | Status: DC | PRN
  Administered 2015-02-02: 10 mL
  Filled 2015-02-02: qty 10

## 2015-02-02 MED ORDER — SODIUM CHLORIDE 0.9 % IV SOLN
1.2000 mg/m2 | Freq: Once | INTRAVENOUS | Status: AC
  Administered 2015-02-02: 2.2 mg via INTRAVENOUS
  Filled 2015-02-02: qty 4.4

## 2015-02-02 MED ORDER — SODIUM CHLORIDE 0.9 % IV SOLN
Freq: Once | INTRAVENOUS | Status: AC
  Administered 2015-02-02: 11:00:00 via INTRAVENOUS

## 2015-02-02 MED ORDER — HEPARIN SOD (PORK) LOCK FLUSH 100 UNIT/ML IV SOLN
500.0000 [IU] | Freq: Once | INTRAVENOUS | Status: AC | PRN
  Administered 2015-02-02: 500 [IU]
  Filled 2015-02-02: qty 5

## 2015-02-02 MED ORDER — SODIUM CHLORIDE 0.9 % IV SOLN
Freq: Once | INTRAVENOUS | Status: AC
  Administered 2015-02-02: 11:00:00 via INTRAVENOUS
  Filled 2015-02-02: qty 1

## 2015-02-02 NOTE — Patient Instructions (Signed)
[facility] Discharge Instructions for Patients Receiving Chemotherapy  Today you received the following chemotherapy agents: Halaven.   To help prevent nausea and vomiting after your treatment, we encourage you to take your nausea medication: Zofran. Take one every 8 hours as needed.    If you develop nausea and vomiting that is not controlled by your nausea medication, call the clinic.   BELOW ARE SYMPTOMS THAT SHOULD BE REPORTED IMMEDIATELY:  *FEVER GREATER THAN 100.5 F  *CHILLS WITH OR WITHOUT FEVER  NAUSEA AND VOMITING THAT IS NOT CONTROLLED WITH YOUR NAUSEA MEDICATION  *UNUSUAL SHORTNESS OF BREATH  *UNUSUAL BRUISING OR BLEEDING  TENDERNESS IN MOUTH AND THROAT WITH OR WITHOUT PRESENCE OF ULCERS  *URINARY PROBLEMS  *BOWEL PROBLEMS  UNUSUAL RASH Items with * indicate a potential emergency and should be followed up as soon as possible.  Feel free to call the clinic should you have any questions or concerns. The clinic phone number is [phone].  Please show the CHEMO ALERT CARD at check-in to the Emergency Department and triage nurse.   

## 2015-02-02 NOTE — Telephone Encounter (Signed)
Appointments made and avs will be printed in chemo  °

## 2015-02-02 NOTE — Assessment & Plan Note (Addendum)
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 Plan for 6 cycles of chemotherapy with CT scans after 3 cycles and PET CT scan after 6 cycles  Current treatment: Cycle 3 Halaven Labs were reviewed and are adequate for treatment  Chemotherapy toxicities: 1. Mild to moderate fatigue 2. Decreased appetite  3. Neutropenia grade 1: ANC 1.1 4. Anemia due to chemotherapy: Hemoglobin 10.8  Bone Mets: Zometa with calcium and vitamin D  RTC in 3 weeks for cycle 4 and to review scans

## 2015-02-02 NOTE — Progress Notes (Signed)
Patient Care Team: Iona Beard, MD as PCP - General (Family Medicine) Autumn Messing III, MD as Consulting Physician (General Surgery) Nicholas Lose, MD as Consulting Physician (Hematology and Oncology) Arloa Koh, MD as Consulting Physician (Radiation Oncology)  DIAGNOSIS: Breast cancer of 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 [name]   Staging form: Breast, AJCC 7th Edition     Clinical: Stage IV (T4b, N1, M1) - Signed by Rulon Eisenmenger, MD on 12/30/2013     Pathologic: No stage assigned - Unsigned   SUMMARY OF ONCOLOGIC HISTORY:   Breast cancer of 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 ([name])   12/01/2013 Initial Diagnosis Breast cancer of 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   12/06/2013 Breast MRI Enhancing tumor throughout the right breast all quadrants with diffuse skin thickening 11.5 x 11 x 5 cm: Spiculated mass posterior UOQ right breast 2.6 x 2.5 x 2 cm and abuts pectoralis muscle: 2 abnormal axillary lymph nodes largest 2.2 cm   12/20/2013 PET scan Stage IV disease noted with 1. Diffuse hypermetabolic right breast inflammatory carcinoma.  Metastatic lymphadenopathy in the right axilla, mediastinum, and bilateral hilar regions, small bilateral pulmonary metastases, possible 9th rib metastases   12/30/2013 - 04/14/2014 Chemotherapy Taxotere and cytoxan given on day 1 of a 21 day cycle with neulasta given on day 2 for granulocyte support.  A total of 6 cycles are planned with CT chest and mammo/ultrasound planned after cycle 3.    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 Valere Dross to chest wall and axilla   08/23/2014 -  Anti-estrogen oral therapy Anastrozole 1 mg daily   12/14/2014 Imaging MRI right shoulder: Bone metastases involving the glenoid and coracoid periostitis and possible extraosseous ext of tumor causing soft tissue edema, ? nondisplaced pathologic coracoid base fracture, lesions in proximal humerus, ext left axillary lymphaden   12/14/2014 Relapse/Recurrence Left Axill LN Biopsy: Metastatic cancer Er 20%, PR 0%, Her 2 Neg   12/18/2014 PET scan Widespread progression, Left Axill LN, Sub pectoralis LN, Bil Hilar LN,  RUL Lung nodule, RML nodule, RUL nodule, Rt Effusion, Musculature Rt chest wall, Bone mets, Liver mets   12/22/2014 -  Chemotherapy Palliative chemotherapy with Halaven day 1 day 8 every 3 weeks    CHIEF COMPLIANT: Cycle 3 Halaven  INTERVAL HISTORY: Karen Douglas is a 59 year old with above-mentioned history of metastatic breast cancer currently on palliative chemotherapy with Halaven. She is tolerating this treatment extremely well without any major problems or concerns. She does have mild-to-moderate fatigue. She complains of pain in the left shoulder which limits her ability to elevate her arm above the shoulder level. She does take Percocet for pain which appears to be helping her significantly. She does feel that last week she went fishing with her husband. This is something that they both enjoy doing. Today's  her but then she celebrated her birthday last week with her sisters. She started going back to church as well and this has been helping her a lot.  REVIEW OF SYSTEMS:   Constitutional: Denies fevers, chills or abnormal weight loss Eyes: Denies  blurriness of vision Ears, nose, mouth, throat, and face: Denies mucositis or sore throat Respiratory: Denies cough, dyspnea or wheezes Cardiovascular: Denies palpitation, chest discomfort or lower extremity swelling Gastrointestinal:  Denies nausea, heartburn or change in bowel habits Skin: Denies abnormal skin rashes Lymphatics: Denies new lymphadenopathy or easy bruising Neurological:Denies numbness, tingling or new weaknesses, difficulty in elevation of the arm above the shoulder level. Behavioral/Psych: Mood is stable, no new changes   All other systems were reviewed with the patient and are negative.  I have reviewed the past medical history, past surgical history, social history and family history with the patient and they are unchanged from previous note.  ALLERGIES:  has No Known Allergies.  MEDICATIONS:  Current Outpatient Prescriptions  Medication Sig Dispense Refill  . anastrozole (ARIMIDEX) 1 MG tablet Take 1 tablet (1 mg total) by mouth daily. 90 tablet 3  . atorvastatin (LIPITOR) 20 MG tablet Take 20 mg by mouth daily.    . calcium carbonate (OS-CAL) 600 MG TABS tablet Take 600 mg by mouth daily with breakfast.    . HYDROcodone-acetaminophen (NORCO/VICODIN) 5-325 MG tablet Take 1-2 tablets by mouth every 6 (six) hours as needed for moderate pain. 30 tablet 0  . lidocaine-prilocaine (EMLA) cream Apply to affected area once 30 g 3  . ondansetron (ZOFRAN) 8 MG tablet Take 1 tablet (8 mg total) by mouth 2 (two) times daily. Start the day after chemo for 2 days. Then take as needed for nausea or vomiting. 30 tablet 1  . oxyCODONE-acetaminophen (PERCOCET/ROXICET) 5-325 MG tablet Take 1 tablet by mouth every 8 (eight) hours as needed for severe pain. for pain 30 tablet 0  . penicillin v potassium (VEETID) 500 MG tablet Take 1 tablet (500 mg total) by mouth 4 (four) times daily. 56 tablet 0  . polyethylene glycol (MIRALAX / GLYCOLAX) packet Take 17 g by mouth daily as needed for  mild constipation.    . prochlorperazine (COMPAZINE) 10 MG tablet Take 1 tablet (10 mg total) by mouth every 6 (six) hours as needed (Nausea or vomiting). 30 tablet 1   No current facility-administered medications for this visit.    PHYSICAL EXAMINATION: ECOG PERFORMANCE STATUS: 1 - Symptomatic but completely ambulatory  Filed Vitals:   02/02/15 0949  BP: 116/75  Pulse: 92  Temp: 98.8 F (37.1 C)  Resp: 18   Filed Weights   02/02/15 0949  Weight: 170 lb 8 oz (77.338 kg)    GENERAL:alert, no distress and comfortable SKIN: skin color, texture, turgor are normal, no rashes or significant lesions EYES: normal, Conjunctiva are pink and non-injected, sclera clear OROPHARYNX:no exudate, no erythema and lips, buccal mucosa, and tongue normal  NECK: supple, thyroid normal size, non-tender, without nodularity LYMPH:  no palpable lymphadenopathy in the cervical, axillary or inguinal LUNGS: clear to auscultation and percussion with normal breathing effort HEART: regular rate & rhythm and no murmurs and no lower extremity edema ABDOMEN:abdomen soft, non-tender and normal bowel sounds Musculoskeletal: Pain with abduction of the arm above shoulder level NEURO: alert & oriented x 3 with fluent speech, no focal motor/sensory deficits  LABORATORY DATA:  I have reviewed the data as listed   Chemistry      Component Value Date/Time  NA 141 02/02/2015 0908   NA 140 05/17/2014 0544   K 4.1 02/02/2015 0908   K 4.1 05/17/2014 0544   CL 111 05/17/2014 0544   CO2 22 02/02/2015 0908   CO2 24 05/17/2014 0544   BUN 7.8 02/02/2015 0908   BUN 13 05/17/2014 0544   CREATININE 0.8 02/02/2015 0908   CREATININE 0.81 05/17/2014 0544      Component Value Date/Time   CALCIUM 9.3 02/02/2015 0908   CALCIUM 8.3* 05/17/2014 0544   ALKPHOS 105 02/02/2015 0908   AST 21 02/02/2015 0908   ALT 14 02/02/2015 0908   BILITOT 0.40 02/02/2015 0908       Lab Results  Component Value Date   WBC 8.5  02/02/2015   HGB 9.9* 02/02/2015   HCT 30.5* 02/02/2015   MCV 83.9 02/02/2015   PLT 278 02/02/2015   NEUTROABS 7.0* 02/02/2015     RADIOGRAPHIC STUDIES: I have personally reviewed the radiology reports and agreed with their findings. No results found.   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 Plan for 6 cycles of chemotherapy with CT scans after 3 cycles and PET CT scan after 6 cycles  Current treatment: Cycle 3 Halaven Labs were reviewed and are adequate for treatment  Chemotherapy toxicities: 1. Mild to moderate fatigue 2. Decreased appetite  3. Neutropenia grade 1: ANC 1.1, resolved with Neulasta 4. Anemia due to chemotherapy: Hemoglobin 9.9 being monitored  Bone Mets: Zometa with calcium and vitamin D. Patient tells me that she needs teeth extraction sometime in the spring of 2017. If that is the case we will stop Zometa after December. Reviewed the prescription for Percocets. I ordered CT scans to be done prior to next  cycle of treatment.  RTC in 3 weeks for cycle 4 and to review scans   No orders of the defined types were placed in this encounter.   The patient has a good understanding of the overall plan. she agrees with it. she will call with any problems that may develop before the next visit here.   Rulon Eisenmenger, MD 02/02/2015

## 2015-02-03 ENCOUNTER — Ambulatory Visit: Payer: Commercial Managed Care - HMO

## 2015-02-09 ENCOUNTER — Other Ambulatory Visit: Payer: Commercial Managed Care - HMO

## 2015-02-09 ENCOUNTER — Ambulatory Visit: Payer: Commercial Managed Care - HMO

## 2015-02-09 ENCOUNTER — Other Ambulatory Visit (HOSPITAL_BASED_OUTPATIENT_CLINIC_OR_DEPARTMENT_OTHER): Payer: Commercial Managed Care - HMO

## 2015-02-09 ENCOUNTER — Ambulatory Visit (HOSPITAL_BASED_OUTPATIENT_CLINIC_OR_DEPARTMENT_OTHER): Payer: Commercial Managed Care - HMO

## 2015-02-09 VITALS — BP 137/69 | HR 114 | Temp 97.7°F

## 2015-02-09 DIAGNOSIS — C50511 Malignant neoplasm of 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: Secondary | ICD-10-CM

## 2015-02-09 DIAGNOSIS — Z95828 Presence of other vascular implants and grafts: Secondary | ICD-10-CM

## 2015-02-09 DIAGNOSIS — Z5111 Encounter for antineoplastic chemotherapy: Secondary | ICD-10-CM

## 2015-02-09 LAB — COMPREHENSIVE METABOLIC PANEL (CC13)
ALT: 15 U/L (ref 0–55)
ANION GAP: 11 meq/L (ref 3–11)
AST: 27 U/L (ref 5–34)
Albumin: 3.2 g/dL — ABNORMAL LOW (ref 3.5–5.0)
Alkaline Phosphatase: 79 U/L (ref 40–150)
BUN: 8.8 mg/dL (ref 7.0–26.0)
CHLORIDE: 108 meq/L (ref 98–109)
CO2: 20 mEq/L — ABNORMAL LOW (ref 22–29)
Calcium: 9.3 mg/dL (ref 8.4–10.4)
Creatinine: 0.8 mg/dL (ref 0.6–1.1)
EGFR: 89 mL/min/{1.73_m2} — AB (ref >90)
GLUCOSE: 102 mg/dL (ref 70–140)
POTASSIUM: 3.9 meq/L (ref 3.5–5.1)
SODIUM: 139 meq/L (ref 136–145)
Total Bilirubin: 0.64 mg/dL (ref 0.20–1.20)
Total Protein: 7 g/dL (ref 6.4–8.3)

## 2015-02-09 LAB — CBC WITH DIFFERENTIAL/PLATELET
BASO%: 1 % (ref 0.0–2.0)
Basophils Absolute: 0 10*3/uL (ref 0.0–0.1)
EOS ABS: 0 10*3/uL (ref 0.0–0.5)
EOS%: 0.1 % (ref 0.0–7.0)
HCT: 29.7 % — ABNORMAL LOW (ref 34.8–46.6)
HGB: 9.8 g/dL — ABNORMAL LOW (ref 11.6–15.9)
LYMPH%: 13.3 % — AB (ref 14.0–49.7)
MCH: 27.3 pg (ref 25.1–34.0)
MCHC: 32.9 g/dL (ref 31.5–36.0)
MCV: 82.9 fL (ref 79.5–101.0)
MONO#: 0.5 10*3/uL (ref 0.1–0.9)
MONO%: 12 % (ref 0.0–14.0)
NEUT%: 73.6 % (ref 38.4–76.8)
NEUTROS ABS: 3.2 10*3/uL (ref 1.5–6.5)
PLATELETS: 359 10*3/uL (ref 145–400)
RBC: 3.58 10*6/uL — AB (ref 3.70–5.45)
RDW: 22.8 % — ABNORMAL HIGH (ref 11.2–14.5)
WBC: 4.4 10*3/uL (ref 3.9–10.3)
lymph#: 0.6 10*3/uL — ABNORMAL LOW (ref 0.9–3.3)

## 2015-02-09 MED ORDER — HEPARIN SOD (PORK) LOCK FLUSH 100 UNIT/ML IV SOLN
500.0000 [IU] | Freq: Once | INTRAVENOUS | Status: AC | PRN
  Administered 2015-02-09: 500 [IU]
  Filled 2015-02-09: qty 5

## 2015-02-09 MED ORDER — SODIUM CHLORIDE 0.9 % IV SOLN
Freq: Once | INTRAVENOUS | Status: AC
  Administered 2015-02-09: 10:00:00 via INTRAVENOUS

## 2015-02-09 MED ORDER — PALONOSETRON HCL INJECTION 0.25 MG/5ML
0.2500 mg | Freq: Once | INTRAVENOUS | Status: AC
  Administered 2015-02-09: 0.25 mg via INTRAVENOUS

## 2015-02-09 MED ORDER — SODIUM CHLORIDE 0.9 % IJ SOLN
10.0000 mL | INTRAMUSCULAR | Status: DC | PRN
  Administered 2015-02-09: 10 mL
  Filled 2015-02-09: qty 10

## 2015-02-09 MED ORDER — SODIUM CHLORIDE 0.9 % IV SOLN
1.2000 mg/m2 | Freq: Once | INTRAVENOUS | Status: AC
  Administered 2015-02-09: 2.2 mg via INTRAVENOUS
  Filled 2015-02-09: qty 4.4

## 2015-02-09 MED ORDER — SODIUM CHLORIDE 0.9 % IJ SOLN
10.0000 mL | INTRAMUSCULAR | Status: DC | PRN
  Administered 2015-02-09: 10 mL via INTRAVENOUS
  Filled 2015-02-09: qty 10

## 2015-02-09 MED ORDER — SODIUM CHLORIDE 0.9 % IV SOLN
Freq: Once | INTRAVENOUS | Status: AC
  Administered 2015-02-09: 10:00:00 via INTRAVENOUS
  Filled 2015-02-09: qty 1

## 2015-02-09 MED ORDER — PALONOSETRON HCL INJECTION 0.25 MG/5ML
INTRAVENOUS | Status: AC
  Filled 2015-02-09: qty 5

## 2015-02-09 NOTE — Patient Instructions (Signed)
[facility] Discharge Instructions for Patients Receiving Chemotherapy  Today you received the following chemotherapy agents Halaven.  To help prevent nausea and vomiting after your treatment, we encourage you to take your nausea medication as prescribed.   If you develop nausea and vomiting that is not controlled by your nausea medication, call the clinic.   BELOW ARE SYMPTOMS THAT SHOULD BE REPORTED IMMEDIATELY:  *FEVER GREATER THAN 100.5 F  *CHILLS WITH OR WITHOUT FEVER  NAUSEA AND VOMITING THAT IS NOT CONTROLLED WITH YOUR NAUSEA MEDICATION  *UNUSUAL SHORTNESS OF BREATH  *UNUSUAL BRUISING OR BLEEDING  TENDERNESS IN MOUTH AND THROAT WITH OR WITHOUT PRESENCE OF ULCERS  *URINARY PROBLEMS  *BOWEL PROBLEMS  UNUSUAL RASH Items with * indicate a potential emergency and should be followed up as soon as possible.  Feel free to call the clinic you have any questions or concerns. The clinic phone number is [phone].  Please show the CHEMO ALERT CARD at check-in to the Emergency Department and triage nurse.   

## 2015-02-10 ENCOUNTER — Ambulatory Visit (HOSPITAL_BASED_OUTPATIENT_CLINIC_OR_DEPARTMENT_OTHER): Payer: Commercial Managed Care - HMO

## 2015-02-10 VITALS — BP 121/66 | HR 116 | Temp 98.6°F

## 2015-02-10 DIAGNOSIS — Z5189 Encounter for other specified aftercare: Secondary | ICD-10-CM | POA: Diagnosis not present

## 2015-02-10 DIAGNOSIS — C50511 Malignant neoplasm of 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: Secondary | ICD-10-CM | POA: Diagnosis not present

## 2015-02-10 MED ORDER — PEGFILGRASTIM INJECTION 6 MG/0.6ML ~~LOC~~
6.0000 mg | PREFILLED_SYRINGE | Freq: Once | SUBCUTANEOUS | Status: AC
  Administered 2015-02-10: 6 mg via SUBCUTANEOUS

## 2015-02-12 ENCOUNTER — Telehealth: Payer: Self-pay

## 2015-02-12 DIAGNOSIS — C50511 Malignant neoplasm of 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: Secondary | ICD-10-CM

## 2015-02-12 NOTE — Telephone Encounter (Signed)
Pt called w/ c/o cough and SOB. Asking if she needs an inhaler. [id number] called pt back. Pt has had cough about 2 weeks, it is getting worse. Pt will get SOB walking from living room to bedroom which is new for her for about the same amount of time. No fever. Dry cough. No apetite. No nausea. This RN could hear SOB and coughing on the phone.  Will set up with [facility], pt requested tomorrow at approx 0900. Scheduler will call with exact time. Pt had eribulin on 11/4, next due on 11/18. CBC and CMET ordered.

## 2015-02-13 ENCOUNTER — Ambulatory Visit (HOSPITAL_BASED_OUTPATIENT_CLINIC_OR_DEPARTMENT_OTHER): Payer: Commercial Managed Care - HMO

## 2015-02-13 ENCOUNTER — Ambulatory Visit (HOSPITAL_COMMUNITY)
Admission: RE | Admit: 2015-02-13 | Discharge: 2015-02-13 | Disposition: A | Discharge status: Home / Self Care | Payer: Commercial Managed Care - HMO | Source: Ambulatory Visit | Attending: Radiology | Admitting: Radiology

## 2015-02-13 ENCOUNTER — Ambulatory Visit (HOSPITAL_COMMUNITY)
Admission: RE | Admit: 2015-02-13 | Discharge: 2015-02-13 | Disposition: A | Discharge status: Home / Self Care | Payer: Commercial Managed Care - HMO | Source: Ambulatory Visit | Attending: Nurse Practitioner | Admitting: Nurse Practitioner

## 2015-02-13 ENCOUNTER — Ambulatory Visit (HOSPITAL_BASED_OUTPATIENT_CLINIC_OR_DEPARTMENT_OTHER): Payer: Commercial Managed Care - HMO | Admitting: Nurse Practitioner

## 2015-02-13 ENCOUNTER — Other Ambulatory Visit (HOSPITAL_BASED_OUTPATIENT_CLINIC_OR_DEPARTMENT_OTHER): Payer: Commercial Managed Care - HMO

## 2015-02-13 VITALS — BP 118/62 | HR 118 | Temp 97.3°F | Resp 18 | Ht 61.0 in | Wt 167.6 lb

## 2015-02-13 DIAGNOSIS — Z452 Encounter for adjustment and management of vascular access device: Secondary | ICD-10-CM | POA: Diagnosis not present

## 2015-02-13 DIAGNOSIS — E876 Hypokalemia: Secondary | ICD-10-CM

## 2015-02-13 DIAGNOSIS — E8809 Other disorders of plasma-protein metabolism, not elsewhere classified: Secondary | ICD-10-CM

## 2015-02-13 DIAGNOSIS — C50511 Malignant neoplasm of 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: Secondary | ICD-10-CM | POA: Insufficient documentation

## 2015-02-13 DIAGNOSIS — J90 Pleural effusion, not elsewhere classified: Secondary | ICD-10-CM

## 2015-02-13 DIAGNOSIS — Z9011 Acquired absence of right breast and nipple: Secondary | ICD-10-CM | POA: Insufficient documentation

## 2015-02-13 DIAGNOSIS — Z9889 Other specified postprocedural states: Secondary | ICD-10-CM

## 2015-02-13 DIAGNOSIS — R05 Cough: Secondary | ICD-10-CM | POA: Diagnosis present

## 2015-02-13 DIAGNOSIS — R0602 Shortness of breath: Secondary | ICD-10-CM

## 2015-02-13 DIAGNOSIS — E46 Unspecified protein-calorie malnutrition: Secondary | ICD-10-CM

## 2015-02-13 DIAGNOSIS — Z95828 Presence of other vascular implants and grafts: Secondary | ICD-10-CM

## 2015-02-13 DIAGNOSIS — J329 Chronic sinusitis, unspecified: Secondary | ICD-10-CM | POA: Insufficient documentation

## 2015-02-13 DIAGNOSIS — J019 Acute sinusitis, unspecified: Secondary | ICD-10-CM

## 2015-02-13 LAB — BODY FLUID CELL COUNT WITH DIFFERENTIAL
Lymphs, Fluid: 39 %
Monocyte-Macrophage-Serous Fluid: 41 % — ABNORMAL LOW (ref 50–90)
NEUTROPHIL FLUID: 20 % (ref 0–25)
Total Nucleated Cell Count, Fluid: 436 cu mm (ref 0–1000)

## 2015-02-13 LAB — COMPREHENSIVE METABOLIC PANEL (CC13)
ALBUMIN: 3.1 g/dL — AB (ref 3.5–5.0)
ALK PHOS: 129 U/L (ref 40–150)
ALT: 15 U/L (ref 0–55)
AST: 30 U/L (ref 5–34)
Anion Gap: 11 mEq/L (ref 3–11)
BILIRUBIN TOTAL: 0.66 mg/dL (ref 0.20–1.20)
BUN: 10 mg/dL (ref 7.0–26.0)
CALCIUM: 9.4 mg/dL (ref 8.4–10.4)
CO2: 22 mEq/L (ref 22–29)
CREATININE: 0.9 mg/dL (ref 0.6–1.1)
Chloride: 108 mEq/L (ref 98–109)
EGFR: 78 mL/min/{1.73_m2} — ABNORMAL LOW (ref >90)
GLUCOSE: 118 mg/dL (ref 70–140)
Potassium: 3.2 mEq/L — ABNORMAL LOW (ref 3.5–5.1)
SODIUM: 141 meq/L (ref 136–145)
TOTAL PROTEIN: 6.9 g/dL (ref 6.4–8.3)

## 2015-02-13 LAB — CBC WITH DIFFERENTIAL/PLATELET
BASO%: 0.4 % (ref 0.0–2.0)
BASOS ABS: 0.1 10*3/uL (ref 0.0–0.1)
EOS%: 0 % (ref 0.0–7.0)
Eosinophils Absolute: 0 10*3/uL (ref 0.0–0.5)
HCT: 29.9 % — ABNORMAL LOW (ref 34.8–46.6)
HGB: 9.6 g/dL — ABNORMAL LOW (ref 11.6–15.9)
LYMPH%: 5.5 % — AB (ref 14.0–49.7)
MCH: 26.8 pg (ref 25.1–34.0)
MCHC: 32 g/dL (ref 31.5–36.0)
MCV: 83.6 fL (ref 79.5–101.0)
MONO#: 0.5 10*3/uL (ref 0.1–0.9)
MONO%: 3.8 % (ref 0.0–14.0)
NEUT#: 12.6 10*3/uL — ABNORMAL HIGH (ref 1.5–6.5)
NEUT%: 90.3 % — AB (ref 38.4–76.8)
Platelets: 352 10*3/uL (ref 145–400)
RBC: 3.57 10*6/uL — ABNORMAL LOW (ref 3.70–5.45)
RDW: 23 % — ABNORMAL HIGH (ref 11.2–14.5)
WBC: 14 10*3/uL — ABNORMAL HIGH (ref 3.9–10.3)
lymph#: 0.8 10*3/uL — ABNORMAL LOW (ref 0.9–3.3)

## 2015-02-13 LAB — ALBUMIN, FLUID (OTHER): Albumin, Fluid: 3.1 g/dL

## 2015-02-13 LAB — LACTATE DEHYDROGENASE, PLEURAL OR PERITONEAL FLUID: LD, Fluid: 355 U/L — ABNORMAL HIGH (ref 3–23)

## 2015-02-13 LAB — GLUCOSE, SEROUS FLUID: GLUCOSE FL: 122 mg/dL

## 2015-02-13 LAB — PROTEIN, BODY FLUID: Total protein, fluid: 4.9 g/dL

## 2015-02-13 MED ORDER — SODIUM CHLORIDE 0.9 % IJ SOLN
10.0000 mL | INTRAMUSCULAR | Status: DC | PRN
  Administered 2015-02-13: 10 mL via INTRAVENOUS
  Filled 2015-02-13: qty 10

## 2015-02-13 MED ORDER — SODIUM CHLORIDE 0.9 % IJ SOLN
10.0000 mL | INTRAMUSCULAR | Status: AC | PRN
  Administered 2015-02-13: 10 mL via INTRAVENOUS
  Filled 2015-02-13: qty 10

## 2015-02-13 MED ORDER — POTASSIUM CHLORIDE CRYS ER 20 MEQ PO TBCR: 20.0000 meq | EXTENDED_RELEASE_TABLET | Freq: Every day | ORAL | Status: AC

## 2015-02-13 MED ORDER — AMOXICILLIN-POT CLAVULANATE 875-125 MG PO TABS: 1.0000 | ORAL_TABLET | Freq: Two times a day (BID) | ORAL | Status: DC

## 2015-02-13 MED ORDER — HEPARIN SOD (PORK) LOCK FLUSH 100 UNIT/ML IV SOLN
500.0000 [IU] | Freq: Once | INTRAVENOUS | Status: AC
  Administered 2015-02-13: 500 [IU] via INTRAVENOUS
  Filled 2015-02-13: qty 5

## 2015-02-13 NOTE — Addendum Note (Signed)
Addended by: Daisy Floro R on: 02/13/2015 02:10 PM   Modules accepted: Orders, SmartSet

## 2015-02-13 NOTE — Procedures (Addendum)
US guided diagnostic/therapeutic left thoracentesis performed yielding 1.2 liters blood-tinged fluid. The fluid was sent to the lab for preordered studies. F/u CXR pending. No immediate complications. Only the above amount of fluid was removed today secondary to persistent pt coughing.

## 2015-02-14 ENCOUNTER — Encounter: Payer: Self-pay | Admitting: Nurse Practitioner

## 2015-02-14 LAB — GRAM STAIN

## 2015-02-14 NOTE — Assessment & Plan Note (Signed)
Patient reports symptoms of a URI which consist of nasal congestion, sinus drainage, feeling of fullness to bilateral ears, and intermittent headache for the past 1-2 weeks.  She is also noticed increased shortness of breath and coughing.  She denies any other new symptoms whatsoever.  She denies any recent fevers or chills.  On exam it does appear the patient does have some moderate nasal congestion; but no facial tenderness with palpation.  Oropharynx is clear.  Right lung fields essentially clear.  Left lung fields with minimal to no air movement to the base.  Patient does not appear short of breath, or in distress on exam.  Will treat patient for mild sinusitis symptoms with Augmentin antibiotics.  Chest x-ray obtained today revealed elevated left hemidiaphragm with a left effusion and left base consolidation.  The right lung was clear.  There was no pneumothorax.  After reviewing x-ray results-patient was transported to interventional radiology for a therapeutic/diagnostic left pleural effusion.

## 2015-02-14 NOTE — Assessment & Plan Note (Signed)
Patient reports symptoms of a URI which consist of nasal congestion, sinus drainage, feeling of fullness to bilateral ears, and intermittent headache for the past 1-2 weeks.  She is also noticed increased shortness of breath and coughing.  She denies any other new symptoms whatsoever.  She denies any recent fevers or chills.  On exam.  A does appear the patient does have some moderate nasal congestion; but no facial tenderness with palpation.  Oropharynx is clear.  Will treat patient for mild sinusitis symptoms with Augmentin antibiotics.

## 2015-02-14 NOTE — Progress Notes (Signed)
SYMPTOM MANAGEMENT CLINIC   HPI: Karen Douglas 59 y.o. female diagnosed with breast cancer with both liver and bone metastasis.  Patient is currently undergoing Eribulin/Neulasta chemotherapy regimen.  Patient presents to the [facility] today with complaint of URI and increased cough/shortness of breath.  She denies any recent fevers or chills.  HPI  ROS  Past Medical History  Diagnosis Date  . Headache   . Cancer ([place]) 11/28/13 bx    breast  . Breast cancer ([place]) 11/28/13    right  invasive mammary ca, metastatic, mammary ca in situ  . 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 Status post chemotherapy     Completed 6 cycles of Taxotere Cytoxan.    Past Surgical History  Procedure Laterality Date  . Finger surgery    . Portacath placement N/A 12/19/2013    Procedure: INSERTION PORT-A-CATH;  Surgeon: Autumn Messing III, MD;  Location: [facility];  Service: General;  Laterality: N/A;  . Mastectomy modified radical Right 05/15/2014    Procedure: RIGHT MASTECTOMY MODIFIED RADICAL;  Surgeon: Autumn Messing III, MD;  Location: [facility];  Service: General;  Laterality: Right;  . Abdominal hysterectomy  1988    cysts on ovaries    has Breast cancer of 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 ([place]); Arm edema; Shoulder pain; Lymphedema of upper extremity; Bone metastases ([place]); Liver metastases ([place]); Sinusitis; Pleural effusion, left; Hypokalemia; and Hypoalbuminemia due to protein-calorie malnutrition ([place]) on her problem list.    has No Known Allergies.    Medication List       This list is accurate as of: 02/13/15 11:59 PM.  Always use your most recent med list.               amoxicillin-clavulanate 875-125 MG tablet  Commonly known as:  AUGMENTIN  Take 1 tablet by mouth 2 (two) times daily.     anastrozole 1 MG tablet  Commonly known as:  ARIMIDEX  Take 1 tablet (1 mg total) by mouth daily.     atorvastatin 20 MG tablet  Commonly known as:  LIPITOR  Take 20 mg by mouth  daily.     calcium carbonate 600 MG Tabs tablet  Commonly known as:  OS-CAL  Take 600 mg by mouth daily with breakfast.     lidocaine-prilocaine cream  Commonly known as:  EMLA  Apply to affected area once     ondansetron 8 MG tablet  Commonly known as:  ZOFRAN  Take 1 tablet (8 mg total) by mouth 2 (two) times daily. Start the day after chemo for 2 days. Then take as needed for nausea or vomiting.     oxyCODONE-acetaminophen 5-325 MG tablet  Commonly known as:  PERCOCET/ROXICET  Take 1 tablet by mouth every 8 (eight) hours as needed for severe pain. for pain     polyethylene glycol packet  Commonly known as:  MIRALAX / GLYCOLAX  Take 17 g by mouth daily as needed for mild constipation.     potassium chloride SA 20 MEQ tablet  Commonly known as:  K-DUR,KLOR-CON  Take 1 tablet (20 mEq total) by mouth daily.     prochlorperazine 10 MG tablet  Commonly known as:  COMPAZINE  Take 1 tablet (10 mg total) by mouth every 6 (six) hours as needed (Nausea or vomiting).         PHYSICAL EXAMINATION  Oncology Vitals 02/13/2015 02/13/2015  Height - 155 cm  Weight - 76.023  kg  Weight (lbs) - 167 lbs 10 oz  BMI (kg/m2) - 31.67 kg/m2  Temp - 97.3  Pulse 118 131  Resp - 18  SpO2 95 92  BSA (m2) - 1.81 m2   BP Readings from Last 2 Encounters:  02/13/15 95/63  02/13/15 118/62    Physical Exam  Constitutional: She is oriented to person, place, and time and well-developed, well-nourished, and in no distress.  HENT:  Head: Normocephalic and atraumatic.  Right Ear: External ear normal.  Left Ear: External ear normal.  Mouth/Throat: Oropharynx is clear and moist.  Eyes: Conjunctivae and EOM are normal. Pupils are equal, round, and reactive to light. Right eye exhibits no discharge. Left eye exhibits no discharge. No scleral icterus.  Neck: Normal range of motion. Neck supple. No JVD present. No tracheal deviation present. No thyromegaly present.  Cardiovascular: Normal rate, regular  rhythm, normal heart sounds and intact distal pulses.   Pulmonary/Chest: Effort normal. No respiratory distress. She has no wheezes. She has no rales. She exhibits no tenderness.  Right lung fields, essentially clear.  Left lung fields with minimal to no air movement to bases.  No obvious shortness of breath on exam.  Abdominal: Soft. Bowel sounds are normal. She exhibits no distension and no mass. There is no tenderness. There is no rebound and no guarding.  Musculoskeletal: Normal range of motion. She exhibits no edema or tenderness.  Lymphadenopathy:    She has no cervical adenopathy.  Neurological: She is alert and oriented to person, place, and time. Gait normal.  Skin: Skin is warm and dry. No rash noted. No erythema. No pallor.  Psychiatric: Affect normal.  Nursing note and vitals reviewed.   LABORATORY DATA:. Hospital Outpatient Visit on 02/13/2015  Component Date Value Ref Range Status  . Albumin, Fluid 02/13/2015 3.1   Final   Comment: (NOTE) No normal range established for this test Results should be evaluated in conjunction with serum values Performed at [facility]   . Fluid Type-FALB 02/13/2015 Pleural, L   Final  . LD, Fluid 02/13/2015 355* 3 - 23 U/L Final   Comment: (NOTE) Results should be evaluated in conjunction with serum values Performed at [facility]   . Fluid Type-FLDH 02/13/2015 Pleural, L   Final  . Total protein, fluid 02/13/2015 4.9   Final   Comment: (NOTE) No normal range established for this test Results should be evaluated in conjunction with serum values Performed at [facility]   . Fluid Type-FTP 02/13/2015 Pleural, L   Final  . Fluid Type-FCT 02/13/2015 Pleural, L   Final  . Color, Fluid 02/13/2015 AMBER* YELLOW Final  . Appearance, Fluid 02/13/2015 TURBID* CLEAR Final  . WBC, Fluid 02/13/2015 436  0 - 1000 cu mm Final  . Neutrophil Count, Fluid 02/13/2015 20  0 - 25 % Final  . Lymphs, Fluid 02/13/2015 39   Final  .  Monocyte-Macrophage-Serous Fluid 02/13/2015 41* 50 - 90 % Final  . Other Cells, Fluid 02/13/2015 CORRELATE WITH CYTOLOGY.   Final  . Specimen Description 02/13/2015 PLEURAL L   Final  . Special Requests 02/13/2015 NONE   Final  . Gram Stain 02/13/2015    Final                   Value:RARE WBC PRESENT, PREDOMINANTLY MONONUCLEAR NO ORGANISMS SEEN Performed at [facility]   . Report Status 02/13/2015 02/14/2015 FINAL   Final  . Glucose, Fluid 02/13/2015 122  Final   Comment: (NOTE) No normal range established for this test Results should be evaluated in conjunction with serum values Performed at [facility]   . Fluid Type-FGLU 02/13/2015 Pleural, L   Final  Appointment on 02/13/2015  Component Date Value Ref Range Status  . WBC 02/13/2015 14.0* 3.9 - 10.3 10e3/uL Final  . NEUT# 02/13/2015 12.6* 1.5 - 6.5 10e3/uL Final  . HGB 02/13/2015 9.6* 11.6 - 15.9 g/dL Final  . HCT 02/13/2015 29.9* 34.8 - 46.6 % Final  . Platelets 02/13/2015 352  145 - 400 10e3/uL Final  . MCV 02/13/2015 83.6  79.5 - 101.0 fL Final  . MCH 02/13/2015 26.8  25.1 - 34.0 pg Final  . MCHC 02/13/2015 32.0  31.5 - 36.0 g/dL Final  . RBC 02/13/2015 3.57* 3.70 - 5.45 10e6/uL Final  . RDW 02/13/2015 23.0* 11.2 - 14.5 % Final  . lymph# 02/13/2015 0.8* 0.9 - 3.3 10e3/uL Final  . MONO# 02/13/2015 0.5  0.1 - 0.9 10e3/uL Final  . Eosinophils Absolute 02/13/2015 0.0  0.0 - 0.5 10e3/uL Final  . Basophils Absolute 02/13/2015 0.1  0.0 - 0.1 10e3/uL Final  . NEUT% 02/13/2015 90.3* 38.4 - 76.8 % Final  . LYMPH% 02/13/2015 5.5* 14.0 - 49.7 % Final  . MONO% 02/13/2015 3.8  0.0 - 14.0 % Final  . EOS% 02/13/2015 0.0  0.0 - 7.0 % Final  . BASO% 02/13/2015 0.4  0.0 - 2.0 % Final  . Sodium 02/13/2015 141  136 - 145 mEq/L Final  . Potassium 02/13/2015 3.2* 3.5 - 5.1 mEq/L Final  . Chloride 02/13/2015 108  98 - 109 mEq/L Final  . CO2 02/13/2015 22  22 - 29 mEq/L Final  . Glucose 02/13/2015 118  70 - 140 mg/dl Final    Glucose reference range is for nonfasting patients. Fasting glucose reference range is 70- 100.  Marland Kitchen BUN 02/13/2015 10.0  7.0 - 26.0 mg/dL Final  . Creatinine 02/13/2015 0.9  0.6 - 1.1 mg/dL Final  . Total Bilirubin 02/13/2015 0.66  0.20 - 1.20 mg/dL Final  . Alkaline Phosphatase 02/13/2015 129  40 - 150 U/L Final  . AST 02/13/2015 30  5 - 34 U/L Final  . ALT 02/13/2015 15  0 - 55 U/L Final  . Total Protein 02/13/2015 6.9  6.4 - 8.3 g/dL Final  . Albumin 02/13/2015 3.1* 3.5 - 5.0 g/dL Final  . Calcium 02/13/2015 9.4  8.4 - 10.4 mg/dL Final  . Anion Gap 02/13/2015 11  3 - 11 mEq/L Final  . EGFR 02/13/2015 78* >90 ml/min/1.73 m2 Final   eGFR is calculated using the CKD-EPI Creatinine Equation (2009)     RADIOGRAPHIC STUDIES: Dg Chest 1 View  02/13/2015  CLINICAL DATA:  Status post thoracentesis EXAM: CHEST 1 VIEW COMPARISON:  February 13, 2015 study obtained earlier in the day FINDINGS: There has been marked diminution of left effusion following thoracentesis. No pneumothorax. There is a small residual left effusion with patchy left mid and lower lung zone atelectasis. There is no new opacity. No airspace consolidation. Heart size and pulmonary vascularity are normal. Port-A-Cath tip is in the superior cava. Patient is status post right mastectomy with surgical clips in the right axillary region. IMPRESSION: Marked diminution of effusion on the left following thoracentesis. Small left effusion remains with mild left-sided atelectasis. No edema or consolidation. No pneumothorax. Electronically Signed   By: Lowella Grip III M.D.   On: 02/13/2015 15:00   Dg Chest 2 View  02/13/2015  CLINICAL DATA:  Cough and shortness of breath. History of metastatic breast carcinoma EXAM: CHEST  2 VIEW COMPARISON:  PET-CT December 18, 2014 FINDINGS: There is marked elevation of the left hemidiaphragm. There is left pleural effusion with left base consolidation. Right lung is clear. Heart size and pulmonary  vascularity are normal. Patient is status post right mastectomy with surgical clips in the right axillary region. Port-A-Cath tip is in the superior vena cava. No pneumothorax. The bilateral hilar adenopathy seen on recent PET/CT examination is not well seen by radiography. No bone lesions are apparent by radiography. IMPRESSION: Elevation left hemidiaphragm with left effusion and left base consolidation. Right lung clear. Status post right mastectomy with surgical clips in the right axillary region. Port-A-Cath in superior vena cava. No pneumothorax. Electronically Signed   By: Lowella Grip III M.D.   On: 02/13/2015 09:23   US Thoracentesis Asp Pleural Space W/img Guide  02/13/2015  INDICATION: Breast cancer, dyspnea, cough, left pleural effusion. Request is made for diagnostic and therapeutic left thoracentesis. EXAM: ULTRASOUND GUIDED DIAGNOSTIC AND THERAPEUTIC LEFT THORACENTESIS COMPARISON:  None MEDICATIONS: None COMPLICATIONS: None immediate TECHNIQUE: Informed written consent was obtained from the patient after a discussion of the risks, benefits and alternatives to treatment. A timeout was performed prior to the initiation of the procedure. Initial ultrasound scanning demonstrates a moderate-to-large left pleural effusion. The lower chest was prepped and draped in the usual sterile fashion. 1% lidocaine was used for local anesthesia. An ultrasound image was saved for documentation purposes. A 6 Fr Safe-T-Centesis catheter was introduced. The thoracentesis was performed. The catheter was removed and a dressing was applied. The patient tolerated the procedure well without immediate post procedural complication. The patient was escorted to have an upright chest radiograph. FINDINGS: A total of approximately 1.2 liters of blood-tinged fluid was removed. Requested samples were sent to the laboratory. Only the above amount of fluid was removed today secondary to persistent patient coughing. IMPRESSION:  Successful ultrasound-guided diagnostic and therapeutic left sided thoracentesis yielding 1.2 liters of pleural fluid. Read by: Rowe Robert, PA-C Electronically Signed   By: Jerilynn Mages.  Shick M.D.   On: 02/13/2015 15:40    ASSESSMENT/PLAN:    Breast cancer of 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 Patient received her last Eribulin chemotherapy on 02/09/2015.  She received Neulasta for growth factor support on 02/10/2015.  Patient presented to the [facility] today with complaint of increasing shortness of breath and cough.  She also felt that she had an upper respiratory infection with sinus congestion and drainage.  She denies any recent fevers or chills.  Please see further notes for details.  Blood counts obtained today revealed a WBC of 14.0, ANC 12.6, hemoglobin 9.6, platelet count 352.  Patient is scheduled for restaging CT of her chest on 02/19/2015.  Patient is scheduled to return for labs, visit, and chemotherapy on 02/23/2015.    Sinusitis Patient reports symptoms of a URI which consist of nasal congestion, sinus drainage, feeling of fullness to bilateral ears, and intermittent headache for the past 1-2 weeks.  She is also noticed increased shortness of breath and coughing.  She denies any other new symptoms whatsoever.  She denies any recent fevers or chills.  On exam.  A does appear the patient does have some moderate nasal congestion; but no facial tenderness with palpation.  Oropharynx is clear.  Will treat patient for mild sinusitis symptoms with Augmentin antibiotics.  Pleural effusion, left Patient reports symptoms of a URI which consist of  nasal congestion, sinus drainage, feeling of fullness to bilateral ears, and intermittent headache for the past 1-2 weeks.  She is also noticed increased shortness of breath and coughing.  She denies any other new symptoms whatsoever.  She denies any recent fevers or chills.  On exam it does appear the patient does have some moderate  nasal congestion; but no facial tenderness with palpation.  Oropharynx is clear.  Right lung fields essentially clear.  Left lung fields with minimal to no air movement to the base.  Patient does not appear short of breath, or in distress on exam.  Will treat patient for mild sinusitis symptoms with Augmentin antibiotics.  Chest x-ray obtained today revealed elevated left hemidiaphragm with a left effusion and left base consolidation.  The right lung was clear.  There was no pneumothorax.  After reviewing x-ray results-patient was transported to interventional radiology for a therapeutic/diagnostic left pleural effusion.    Hypokalemia Potassium was down to 3.2 today.  Patient was prescribed potassium 20 mEq daily for a total of 7 days.  We'll continue to monitor closely.  Hypoalbuminemia due to protein-calorie malnutrition ([place]) Albumen remains low at 3.1.  Patient was encouraged to push protein in her diet is much as possible.  Patient stated understanding of all instructions; and was in agreement with this plan of care. The patient knows to call the clinic with any problems, questions or concerns.   Review/collaboration with Dr. Lindi Adie regarding all aspects of patient's visit today.   Total time spent with patient was 25 minutes;  with greater than 75 percent of that time spent in face to face counseling regarding patient's symptoms,  and coordination of care and follow up.  Disclaimer:This dictation was prepared with Dragon/digital dictation along with [employer]. Any transcriptional errors that result from this process are unintentional.  Drue Second, NP 02/14/2015

## 2015-02-14 NOTE — Assessment & Plan Note (Signed)
Patient received her last Eribulin chemotherapy on 02/09/2015.  She received Neulasta for growth factor support on 02/10/2015.  Patient presented to the [facility] today with complaint of increasing shortness of breath and cough.  She also felt that she had an upper respiratory infection with sinus congestion and drainage.  She denies any recent fevers or chills.  Please see further notes for details.  Blood counts obtained today revealed a WBC of 14.0, ANC 12.6, hemoglobin 9.6, platelet count 352.  Patient is scheduled for restaging CT of her chest on 02/19/2015.  Patient is scheduled to return for labs, visit, and chemotherapy on 02/23/2015.

## 2015-02-14 NOTE — Assessment & Plan Note (Signed)
Albumen remains low at 3.1.  Patient was encouraged to push protein in her diet is much as possible.

## 2015-02-14 NOTE — Assessment & Plan Note (Signed)
Potassium was down to 3.2 today.  Patient was prescribed potassium 20 mEq daily for a total of 7 days.  We'll continue to monitor closely.

## 2015-02-15 ENCOUNTER — Telehealth: Payer: Self-pay | Admitting: *Deleted

## 2015-02-15 NOTE — Telephone Encounter (Signed)
Spoke to pt for follow up. Pt reported SOB has decreased since thoracentesis. Pt reports cough has improved. She is tolerating abx however her appetite has decreased slightly. She states she is taking in increased fluids and drinking at least one boost supplement a day. Pt denies any further questions or concerns. She will report to the [facility] for her CT Scan on Monday and knows to call this office with any further concerns.

## 2015-02-19 ENCOUNTER — Encounter (HOSPITAL_COMMUNITY): Payer: Self-pay

## 2015-02-19 ENCOUNTER — Ambulatory Visit (HOSPITAL_COMMUNITY)
Admission: RE | Admit: 2015-02-19 | Discharge: 2015-02-19 | Disposition: A | Discharge status: Home / Self Care | Payer: Commercial Managed Care - HMO | Source: Ambulatory Visit | Attending: Hematology and Oncology | Admitting: Hematology and Oncology

## 2015-02-19 DIAGNOSIS — R918 Other nonspecific abnormal finding of lung field: Secondary | ICD-10-CM | POA: Diagnosis not present

## 2015-02-19 DIAGNOSIS — C7951 Secondary malignant neoplasm of bone: Secondary | ICD-10-CM | POA: Diagnosis not present

## 2015-02-19 DIAGNOSIS — C773 Secondary and unspecified malignant neoplasm of axilla and upper limb lymph nodes: Secondary | ICD-10-CM | POA: Diagnosis not present

## 2015-02-19 DIAGNOSIS — C787 Secondary malignant neoplasm of liver and intrahepatic bile duct: Secondary | ICD-10-CM | POA: Insufficient documentation

## 2015-02-19 DIAGNOSIS — J90 Pleural effusion, not elsewhere classified: Secondary | ICD-10-CM | POA: Diagnosis not present

## 2015-02-19 DIAGNOSIS — C50511 Malignant neoplasm of 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: Secondary | ICD-10-CM | POA: Diagnosis not present

## 2015-02-19 DIAGNOSIS — C7989 Secondary malignant neoplasm of other specified sites: Secondary | ICD-10-CM | POA: Diagnosis not present

## 2015-02-19 DIAGNOSIS — K769 Liver disease, unspecified: Secondary | ICD-10-CM | POA: Diagnosis not present

## 2015-02-19 MED ORDER — IOHEXOL 300 MG/ML  SOLN
100.0000 mL | Freq: Once | INTRAMUSCULAR | Status: AC | PRN
  Administered 2015-02-19: 100 mL via INTRAVENOUS

## 2015-02-22 NOTE — Assessment & Plan Note (Signed)
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 Plan for 6 cycles of chemotherapy with CT scans after 3 cycles and PET CT scan after 6 cycles  Current treatment: Cycle 3 [place] Labs were reviewed and are adequate for treatment  Chemotherapy toxicities: 1. Mild to moderate fatigue 2. Decreased appetite  3. Neutropenia grade 1: ANC 1.1, resolved with Neulasta 4. Anemia due to chemotherapy: Hemoglobin 9.9 being monitored  Bone Mets: Zometa with calcium and vitamin D. Patient tells me that she needs teeth extraction sometime in the spring of 2017. If that is the case we will stop Zometa after December. Reviewed the prescription for Percocets. I ordered CT scans to be done prior to next cycle of treatment.  CT chest abdomen pelvis 02/19/2015: Stable left axillary and sub pect LN mets, stable medial Rt chest wall mets, Dec Pleural eff, Inc Left Eff, Dec Lung nodules, No sign change in liver mets (difficult to assess), bone mets not well seen  Plan to finish 6 cycles of chemotherapy and then obtain a PET CT scan

## 2015-02-23 ENCOUNTER — Telehealth: Payer: Self-pay | Admitting: Hematology and Oncology

## 2015-02-23 ENCOUNTER — Encounter: Payer: Self-pay | Admitting: Hematology and Oncology

## 2015-02-23 ENCOUNTER — Other Ambulatory Visit (HOSPITAL_BASED_OUTPATIENT_CLINIC_OR_DEPARTMENT_OTHER): Payer: Commercial Managed Care - HMO

## 2015-02-23 ENCOUNTER — Ambulatory Visit (HOSPITAL_BASED_OUTPATIENT_CLINIC_OR_DEPARTMENT_OTHER): Payer: Commercial Managed Care - HMO

## 2015-02-23 ENCOUNTER — Ambulatory Visit: Payer: Commercial Managed Care - HMO

## 2015-02-23 ENCOUNTER — Ambulatory Visit (HOSPITAL_BASED_OUTPATIENT_CLINIC_OR_DEPARTMENT_OTHER): Payer: Commercial Managed Care - HMO | Admitting: Hematology and Oncology

## 2015-02-23 VITALS — BP 114/68 | HR 118 | Temp 97.8°F | Resp 17 | Ht 61.0 in | Wt 163.0 lb

## 2015-02-23 DIAGNOSIS — C7951 Secondary malignant neoplasm of bone: Secondary | ICD-10-CM

## 2015-02-23 DIAGNOSIS — C50511 Malignant neoplasm of 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: Secondary | ICD-10-CM

## 2015-02-23 DIAGNOSIS — Z95828 Presence of other vascular implants and grafts: Secondary | ICD-10-CM

## 2015-02-23 LAB — CBC WITH DIFFERENTIAL/PLATELET
BASO%: 0 % (ref 0.0–2.0)
BASOS ABS: 0 10*3/uL (ref 0.0–0.1)
EOS%: 0.1 % (ref 0.0–7.0)
Eosinophils Absolute: 0 10*3/uL (ref 0.0–0.5)
HCT: 33.2 % — ABNORMAL LOW (ref 34.8–46.6)
HGB: 10.7 g/dL — ABNORMAL LOW (ref 11.6–15.9)
LYMPH#: 0.5 10*3/uL — AB (ref 0.9–3.3)
LYMPH%: 6.9 % — AB (ref 14.0–49.7)
MCH: 27 pg (ref 25.1–34.0)
MCHC: 32.1 g/dL (ref 31.5–36.0)
MCV: 84.2 fL (ref 79.5–101.0)
MONO#: 0.4 10*3/uL (ref 0.1–0.9)
MONO%: 5.4 % (ref 0.0–14.0)
NEUT#: 6.4 10*3/uL (ref 1.5–6.5)
NEUT%: 87.6 % — AB (ref 38.4–76.8)
Platelets: 348 10*3/uL (ref 145–400)
RBC: 3.94 10*6/uL (ref 3.70–5.45)
RDW: 24.8 % — ABNORMAL HIGH (ref 11.2–14.5)
WBC: 7.3 10*3/uL (ref 3.9–10.3)

## 2015-02-23 LAB — COMPREHENSIVE METABOLIC PANEL (CC13)
AST: 23 U/L (ref 5–34)
Albumin: 3.3 g/dL — ABNORMAL LOW (ref 3.5–5.0)
Alkaline Phosphatase: 104 U/L (ref 40–150)
Anion Gap: 11 mEq/L (ref 3–11)
BUN: 12.1 mg/dL (ref 7.0–26.0)
CALCIUM: 9.8 mg/dL (ref 8.4–10.4)
CHLORIDE: 108 meq/L (ref 98–109)
CO2: 22 meq/L (ref 22–29)
CREATININE: 0.9 mg/dL (ref 0.6–1.1)
EGFR: 77 mL/min/{1.73_m2} — ABNORMAL LOW (ref >90)
Glucose: 117 mg/dl (ref 70–140)
POTASSIUM: 3.7 meq/L (ref 3.5–5.1)
Sodium: 140 mEq/L (ref 136–145)
Total Bilirubin: 0.46 mg/dL (ref 0.20–1.20)
Total Protein: 7.2 g/dL (ref 6.4–8.3)

## 2015-02-23 MED ORDER — SODIUM CHLORIDE 0.9 % IV SOLN
Freq: Once | INTRAVENOUS | Status: AC
  Administered 2015-02-23: 11:00:00 via INTRAVENOUS

## 2015-02-23 MED ORDER — PALBOCICLIB 125 MG PO CAPS: 125.0000 mg | ORAL_CAPSULE | Freq: Every day | ORAL | Status: DC

## 2015-02-23 MED ORDER — SODIUM CHLORIDE 0.9 % IJ SOLN
10.0000 mL | INTRAMUSCULAR | Status: DC | PRN
  Administered 2015-02-23: 10 mL via INTRAVENOUS
  Filled 2015-02-23: qty 10

## 2015-02-23 MED ORDER — HEPARIN SOD (PORK) LOCK FLUSH 100 UNIT/ML IV SOLN
500.0000 [IU] | Freq: Once | INTRAVENOUS | Status: AC | PRN
  Administered 2015-02-23: 500 [IU]
  Filled 2015-02-23: qty 5

## 2015-02-23 MED ORDER — SODIUM CHLORIDE 0.9 % IJ SOLN
10.0000 mL | INTRAMUSCULAR | Status: DC | PRN
  Administered 2015-02-23: 10 mL
  Filled 2015-02-23: qty 10

## 2015-02-23 MED ORDER — LETROZOLE 2.5 MG PO TABS: 2.5000 mg | ORAL_TABLET | Freq: Every day | ORAL | Status: AC

## 2015-02-23 MED ORDER — ZOLEDRONIC ACID 4 MG/100ML IV SOLN
4.0000 mg | Freq: Once | INTRAVENOUS | Status: AC
  Administered 2015-02-23: 4 mg via INTRAVENOUS
  Filled 2015-02-23: qty 100

## 2015-02-23 NOTE — Addendum Note (Signed)
Addended by: Prentiss Bells on: 02/23/2015 03:10 PM   Modules accepted: Medications

## 2015-02-23 NOTE — Progress Notes (Signed)
Let pt know Karen Douglas was approved and Rx sent to [facility].  Pt should be hearing from [facility] to arrange delivery.  Requested pt call clinic with her Karen Douglas start date.  Pt will need a lab and OV 1 week after starting ibrance.  Pt voiced understanding.

## 2015-02-23 NOTE — Patient Instructions (Signed)
[facility] Discharge Instructions for Patients Receiving Chemotherapy  Today you received the following agents zometa  To help prevent nausea and vomiting after your treatment, we encourage you to take your nausea medication   If you develop nausea and vomiting that is not controlled by your nausea medication, call the clinic.   BELOW ARE SYMPTOMS THAT SHOULD BE REPORTED IMMEDIATELY:  *FEVER GREATER THAN 100.5 F  *CHILLS WITH OR WITHOUT FEVER  NAUSEA AND VOMITING THAT IS NOT CONTROLLED WITH YOUR NAUSEA MEDICATION  *UNUSUAL SHORTNESS OF BREATH  *UNUSUAL BRUISING OR BLEEDING  TENDERNESS IN MOUTH AND THROAT WITH OR WITHOUT PRESENCE OF ULCERS  *URINARY PROBLEMS  *BOWEL PROBLEMS  UNUSUAL RASH Items with * indicate a potential emergency and should be followed up as soon as possible.  Feel free to call the clinic you have any questions or concerns. The clinic phone number is [phone].  Please show the [facility] at check-in to the Emergency Department and triage nurse.    Zoledronic Acid injection (Hypercalcemia, Oncology) What is this medicine? ZOLEDRONIC ACID (ZOE le dron ik AS id) lowers the amount of calcium loss from bone. It is used to treat too much calcium in your blood from cancer. It is also used to prevent complications of cancer that has spread to the bone. This medicine may be used for other purposes; ask your health care provider or pharmacist if you have questions. What should I tell my health care provider before I take this medicine? They need to know if you have any of these conditions: -aspirin-sensitive asthma -cancer, especially if you are receiving medicines used to treat cancer -dental disease or wear dentures -infection -kidney disease -receiving corticosteroids like dexamethasone or prednisone -an unusual or allergic reaction to zoledronic acid, other medicines, foods, dyes, or preservatives -pregnant or trying to get  pregnant -breast-feeding How should I use this medicine? This medicine is for infusion into a vein. It is given by a health care professional in a hospital or clinic setting. Talk to your pediatrician regarding the use of this medicine in children. Special care may be needed. Overdosage: If you think you have taken too much of this medicine contact a poison control center or emergency room at once. NOTE: This medicine is only for you. Do not share this medicine with others. What if I miss a dose? It is important not to miss your dose. Call your doctor or health care professional if you are unable to keep an appointment. What may interact with this medicine? -certain antibiotics given by injection -NSAIDs, medicines for pain and inflammation, like ibuprofen or naproxen -some diuretics like bumetanide, furosemide -teriparatide -thalidomide This list may not describe all possible interactions. Give your health care provider a list of all the medicines, herbs, non-prescription drugs, or dietary supplements you use. Also tell them if you smoke, drink alcohol, or use illegal drugs. Some items may interact with your medicine. What should I watch for while using this medicine? Visit your doctor or health care professional for regular checkups. It may be some time before you see the benefit from this medicine. Do not stop taking your medicine unless your doctor tells you to. Your doctor may order blood tests or other tests to see how you are doing. Women should inform their doctor if they wish to become pregnant or think they might be pregnant. There is a potential for serious side effects to an unborn child. Talk to your health care professional or pharmacist  for more information. You should make sure that you get enough calcium and vitamin D while you are taking this medicine. Discuss the foods you eat and the vitamins you take with your health care professional. Some people who take this medicine have  severe bone, joint, and/or muscle pain. This medicine may also increase your risk for jaw problems or a broken thigh bone. Tell your doctor right away if you have severe pain in your jaw, bones, joints, or muscles. Tell your doctor if you have any pain that does not go away or that gets worse. Tell your dentist and dental surgeon that you are taking this medicine. You should not have major dental surgery while on this medicine. See your dentist to have a dental exam and fix any dental problems before starting this medicine. Take good care of your teeth while on this medicine. Make sure you see your dentist for regular follow-up appointments. What side effects may I notice from receiving this medicine? Side effects that you should report to your doctor or health care professional as soon as possible: -allergic reactions like skin rash, itching or hives, swelling of the face, lips, or tongue -anxiety, confusion, or depression -breathing problems -changes in vision -eye pain -feeling faint or lightheaded, falls -jaw pain, especially after dental work -mouth sores -muscle cramps, stiffness, or weakness -redness, blistering, peeling or loosening of the skin, including inside the mouth -trouble passing urine or change in the amount of urine Side effects that usually do not require medical attention (report to your doctor or health care professional if they continue or are bothersome): -bone, joint, or muscle pain -constipation -diarrhea -fever -hair loss -irritation at site where injected -loss of appetite -nausea, vomiting -stomach upset -trouble sleeping -trouble swallowing -weak or tired This list may not describe all possible side effects. Call your doctor for medical advice about side effects. You may report side effects to FDA at 1-800-FDA-1088. Where should I keep my medicine? This drug is given in a hospital or clinic and will not be stored at home. NOTE: This sheet is a summary. It  may not cover all possible information. If you have questions about this medicine, talk to your doctor, pharmacist, or health care provider.    2016, Elsevier/Gold Standard. (2013-08-20 14:19:39)

## 2015-02-23 NOTE — Telephone Encounter (Signed)
Appointments made and avs printed for patient °

## 2015-02-23 NOTE — Progress Notes (Signed)
Patient Care Team: Iona Beard, MD as PCP - General (Family Medicine) Autumn Messing III, MD as Consulting Physician (General Surgery) Nicholas Lose, MD as Consulting Physician (Hematology and Oncology) Arloa Koh, MD as Consulting Physician (Radiation Oncology)  DIAGNOSIS: Breast cancer of 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 [facility])   Staging form: Breast, AJCC 7th Edition     Clinical: Stage IV (T4b, N1, M1) - Signed by Rulon Eisenmenger, MD on 12/30/2013     Pathologic: No stage assigned - Unsigned   SUMMARY OF ONCOLOGIC HISTORY:   Breast cancer of 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 ([place])   12/01/2013 Initial Diagnosis Breast cancer of 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   12/06/2013 Breast MRI Enhancing tumor throughout the right breast all quadrants with diffuse skin thickening 11.5 x 11 x 5 cm: Spiculated mass posterior UOQ right breast 2.6 x 2.5 x 2 cm and abuts pectoralis muscle: 2 abnormal axillary lymph nodes largest 2.2 cm   12/20/2013 PET scan Stage IV disease noted with 1. Diffuse hypermetabolic right breast inflammatory carcinoma.  Metastatic lymphadenopathy in the right axilla, mediastinum, and bilateral hilar regions, small bilateral pulmonary metastases, possible 9th rib metastases   12/30/2013 - 04/14/2014 Chemotherapy Taxotere and cytoxan given on day 1 of a 21 day cycle with neulasta given on day 2 for granulocyte support.  A total of 6 cycles are planned with CT chest and mammo/ultrasound planned after cycle 3.    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 Valere Dross to chest wall and axilla   08/23/2014 -  Anti-estrogen oral therapy Anastrozole 1 mg daily   12/14/2014 Imaging MRI right shoulder: Bone metastases involving the glenoid and coracoid periostitis and possible extraosseous ext of tumor causing soft tissue edema, ? nondisplaced pathologic coracoid base fracture, lesions in proximal humerus, ext left axillary lymphaden   12/14/2014 Relapse/Recurrence Left Axill LN Biopsy: Metastatic cancer Er 20%, PR 0%, Her 2 Neg   12/18/2014 PET scan Widespread progression, Left Axill LN, Sub pectoralis LN, Bil Hilar LN,  RUL Lung nodule, RML nodule, RUL nodule, Rt Effusion, Musculature Rt chest wall, Bone mets, Liver mets   12/22/2014 - 02/09/2015 Chemotherapy Palliative chemotherapy with Halaven day 1 day 8 every 3 weeks 3 cycles, CT scans 02/19/2015 show stable disease without any response in the liver   02/19/2015 Imaging Ct [facility]: Stable left axillary and sub pect LN mets, stable medial Rt chest wall mets, Dec Pleural eff, Inc Left Eff, Dec Lung nodules, No sign change in liver mets (difficult to assess), bone mets not well seen   02/23/2015 -  Chemotherapy Ibrance 3 weeks on 1 week off plus letrozole daily    CHIEF COMPLIANT: Follow-up up CT scans to discuss treatment plan  INTERVAL HISTORY: Karen Douglas is a 59 year old with above-mentioned history of metastatic breast cancer  with lung, bone, liver metastases who received 3 cycles of Halaven and had a CT scan. The scans do not show any remarkable response to treatment. Although it stays stable disease, patient had profound shortness of breath and was found to have a large pleural effusion. She underwent thoracentesis  with marked improvement in her symptoms. The pleural fluid came back as positive for metastatic breast cancer. She is here with her husband to discuss a treatment plan. She also complains of chemotherapy has been causing her severe fatigue and decreased appetite.  REVIEW OF SYSTEMS:   Constitutional: Denies fevers, chills or abnormal weight loss Eyes: Denies blurriness of vision Ears, nose, mouth, throat, and face: Denies mucositis or sore throat Respiratory: Dry cough and shortness of breath Cardiovascular: Denies palpitation, chest discomfort or lower extremity swelling Gastrointestinal:  Denies nausea, heartburn or change in bowel habits Skin: Denies abnormal skin rashes Lymphatics: Denies new lymphadenopathy or easy bruising Neurological:Denies numbness, tingling or new weaknesses Behavioral/Psych: Mood is stable, no new changes   All other systems were reviewed with the patient and are negative.  I have reviewed the past medical history, past surgical history, social history and family history with the patient and they are unchanged from previous note.  ALLERGIES:  has No Known Allergies.  MEDICATIONS:  Current Outpatient Prescriptions  Medication Sig Dispense Refill  . amoxicillin-clavulanate (AUGMENTIN) 875-125 MG tablet Take 1 tablet by mouth 2 (two) times daily. 20 tablet 0  . atorvastatin (LIPITOR) 20 MG tablet Take 20 mg by mouth daily.    . calcium carbonate (OS-CAL) 600 MG TABS tablet Take 600 mg by mouth daily with breakfast.    . letrozole (FEMARA) 2.5 MG tablet Take 1 tablet (2.5 mg total) by mouth daily. 90 tablet 3  . lidocaine-prilocaine (EMLA) cream Apply to affected area once 30 g 3  . ondansetron (ZOFRAN) 8 MG tablet Take 1 tablet (8 mg total) by mouth 2 (two) times daily. Start the day after chemo for 2 days. Then take as needed for nausea or vomiting. 30 tablet 1  . oxyCODONE-acetaminophen (PERCOCET/ROXICET) 5-325 MG tablet Take 1 tablet by mouth every 8 (eight)  hours as needed for severe pain. for pain 40 tablet 0  . palbociclib (IBRANCE) 125 MG capsule Take 1 capsule (125 mg total) by mouth daily with breakfast. Take whole with food. 21 capsule 0  . polyethylene glycol (MIRALAX / GLYCOLAX) packet Take 17 g by mouth daily as needed for mild constipation.    . potassium chloride SA (K-DUR,KLOR-CON) 20 MEQ tablet Take 1 tablet (20 mEq total) by mouth daily. 10 tablet 0  . prochlorperazine (COMPAZINE) 10 MG tablet Take 1 tablet (10 mg total) by mouth every 6 (six) hours as needed (Nausea or vomiting). 30 tablet 1   No current facility-administered medications for this visit.   Facility-Administered Medications Ordered in Other Visits  Medication Dose Route Frequency Provider Last Rate Last Dose  . sodium chloride 0.9 % injection 10 mL  10 mL Intravenous PRN Nicholas Lose, MD   10 mL at 02/13/15 1359  . Zoledronic Acid (ZOMETA) 4 mg IVPB  4 mg Intravenous Once Nicholas Lose, MD 400 mL/hr at 02/23/15 1044 4 mg at 02/23/15 1044    PHYSICAL EXAMINATION: ECOG PERFORMANCE STATUS: 1 - Symptomatic but completely ambulatory  Filed Vitals:   02/23/15 0942  BP: 114/68  Pulse: 118  Temp: 97.8 F (36.6 C)  Resp: 17   Filed Weights   02/23/15 0942  Weight: 163 lb (73.936  kg)    GENERAL:alert, no distress and comfortable SKIN: skin color, texture, turgor are normal, no rashes or significant lesions EYES: normal, Conjunctiva are pink and non-injected, sclera clear OROPHARYNX:no exudate, no erythema and lips, buccal mucosa, and tongue normal  NECK: supple, thyroid normal size, non-tender, without nodularity LYMPH:  no palpable lymphadenopathy in the cervical, axillary or inguinal LUNGS: Diminished breath sounds at the lung bases HEART: regular rate & rhythm and no murmurs and no lower extremity edema ABDOMEN:abdomen soft, non-tender and normal bowel sounds Musculoskeletal:no cyanosis of digits and no clubbing  NEURO: alert & oriented x 3 with fluent  speech, grade 1 peripheral neuropathy  LABORATORY DATA:  I have reviewed the data as listed   Chemistry      Component Value Date/Time   NA 140 02/23/2015 0927   NA 140 05/17/2014 0544   K 3.7 02/23/2015 0927   K 4.1 05/17/2014 0544   CL 111 05/17/2014 0544   CO2 22 02/23/2015 0927   CO2 24 05/17/2014 0544   BUN 12.1 02/23/2015 0927   BUN 13 05/17/2014 0544   CREATININE 0.9 02/23/2015 0927   CREATININE 0.81 05/17/2014 0544      Component Value Date/Time   CALCIUM 9.8 02/23/2015 0927   CALCIUM 8.3* 05/17/2014 0544   ALKPHOS 104 02/23/2015 0927   AST 23 02/23/2015 0927   ALT <9 02/23/2015 0927   BILITOT 0.46 02/23/2015 0927       Lab Results  Component Value Date   WBC 7.3 02/23/2015   HGB 10.7* 02/23/2015   HCT 33.2* 02/23/2015   MCV 84.2 02/23/2015   PLT 348 02/23/2015   NEUTROABS 6.4 02/23/2015   ASSESSMENT & PLAN:  Breast cancer of 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 ([place]) 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, 3/4 lymph nodes positive, lymphovascular invasion present, ER 99%, PR 51%, HER-2 negative, Ki-67 95% T3 N1 stage IIIa, completed radiation therapy 08/15/2014, started anastrozole 08/23/2014 stopped 12/18/2014 for progression  PET CT 12/18/14: Widespread progression, Left Axill LN, Sub pectoralis LN, Bil Hilar LN, RUL Lung nodule, RML nodule, RUL nodule, Rt Effusion, Musculature Rt chest wall, Bone mets, Liver mets  CT chest abdomen pelvis 02/19/2015: Stable left axillary and sub pect LN mets, stable medial Rt chest wall mets, Dec Pleural eff, Inc Left Eff, Dec Lung nodules, No sign  change in liver mets (difficult to assess), bone mets not well seen  Prior Treatment: Palliative systemic chemotherapy with Halaven 12/22/2014 D1 day 8 every 3 weeks 3 cycles with not much significant reduction in the liver lesions. Hence we decided to stop Halaven.  Planned treatment: Ibrance with letrozole to start 02/25/2015  Pleural effusion: Status post thoracentesis on 02/13/2015 positive for breast cancer with improvement in her symptoms  Bone Mets: Zometa with calcium and vitamin D. Patient tells me that she needs teeth extraction sometime in the spring of 2017. If that is the case we will stop Zometa after December. We will change this to every 4 weeks Reviewed the prescription for Percocets.  Follow-up plan: Weekly CBC with differential 4 followed by every other week 2 then monthly when she is on Ibrance  Ibrance: I discussed  the risks and benefits of Ibrance including myelosuppression especially neutropenia and with that risk of infection, there is risk of pulmonary embolism and mild peripheral neuropathy as well. Fatigue, nausea, diarrhea, decreased appetite as well as alopecia and thrombocytopenia are also potential side effects of Ibrance  Plan to give her 3 months of this treatment followed by CT scans. Return to clinic 1 week after starting Ibrance  No orders of the defined types were placed in this encounter.   The patient has a good understanding of the overall plan. she agrees with it. she will call with any problems that may develop before the next visit here.   Rulon Eisenmenger, MD 02/23/2015

## 2015-02-23 NOTE — Patient Instructions (Signed)

## 2015-02-26 ENCOUNTER — Telehealth: Payer: Self-pay | Admitting: Hematology and Oncology

## 2015-02-26 NOTE — Telephone Encounter (Signed)
Called with follow up appointment per inbox from dr vg  Webb Silversmith

## 2015-02-28 ENCOUNTER — Encounter: Payer: Self-pay | Admitting: Pharmacist

## 2015-02-28 NOTE — Progress Notes (Signed)
Oral Chemotherapy Pharmacist Encounter   I spoke with patient for overview of new oral chemotherapy medication: Ibrance. Pt is doing well. The prescription has been sent to [facility] as dictated by patient's insurance. Karen Douglas is ready for shipment and patient stated she would contact [facility] (Phone #: [phone]) on 11/22 to schedule delivery.   Counseled patient on administration, dosing, side effects, safe handling, and monitoring. Side effects include but not limited to: myelosuppression, fatigue, headache, peripheral neuropathy and mouth sores.  Ms. Selner voiced understanding and appreciation.   All questions answered.  Will follow up in 1-2 weeks for adherence and toxicity management.   Thank you,  Montel Clock, PharmD, [facility]

## 2015-03-02 ENCOUNTER — Ambulatory Visit: Payer: Commercial Managed Care - HMO

## 2015-03-02 ENCOUNTER — Other Ambulatory Visit: Payer: Commercial Managed Care - HMO

## 2015-03-05 ENCOUNTER — Other Ambulatory Visit: Payer: Self-pay

## 2015-03-05 DIAGNOSIS — C50511 Malignant neoplasm of 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: Secondary | ICD-10-CM

## 2015-03-05 DIAGNOSIS — C787 Secondary malignant neoplasm of liver and intrahepatic bile duct: Secondary | ICD-10-CM

## 2015-03-05 DIAGNOSIS — C7951 Secondary malignant neoplasm of bone: Secondary | ICD-10-CM

## 2015-03-05 NOTE — Progress Notes (Signed)
[facility] for patient.  CXR at [facility] this afternoon or tomorrow morning.  Thoracentesis scheduled at [facility] 2 pm - arrive 145.  Pt to return call to clinic to confirm.

## 2015-03-06 ENCOUNTER — Ambulatory Visit (HOSPITAL_COMMUNITY)
Admission: RE | Admit: 2015-03-06 | Discharge: 2015-03-06 | Disposition: A | Discharge status: Home / Self Care | Payer: Commercial Managed Care - HMO | Source: Ambulatory Visit | Attending: Hematology and Oncology | Admitting: Hematology and Oncology

## 2015-03-06 ENCOUNTER — Ambulatory Visit (HOSPITAL_COMMUNITY)
Admission: RE | Admit: 2015-03-06 | Discharge: 2015-03-06 | Disposition: A | Discharge status: Home / Self Care | Payer: Commercial Managed Care - HMO | Source: Ambulatory Visit | Attending: Radiology | Admitting: Radiology

## 2015-03-06 DIAGNOSIS — C787 Secondary malignant neoplasm of liver and intrahepatic bile duct: Secondary | ICD-10-CM | POA: Diagnosis not present

## 2015-03-06 DIAGNOSIS — R918 Other nonspecific abnormal finding of lung field: Secondary | ICD-10-CM | POA: Insufficient documentation

## 2015-03-06 DIAGNOSIS — C7951 Secondary malignant neoplasm of bone: Secondary | ICD-10-CM | POA: Insufficient documentation

## 2015-03-06 DIAGNOSIS — R0602 Shortness of breath: Secondary | ICD-10-CM | POA: Insufficient documentation

## 2015-03-06 DIAGNOSIS — C50511 Malignant neoplasm of 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: Secondary | ICD-10-CM | POA: Diagnosis not present

## 2015-03-06 DIAGNOSIS — Z9889 Other specified postprocedural states: Secondary | ICD-10-CM | POA: Insufficient documentation

## 2015-03-06 DIAGNOSIS — R05 Cough: Secondary | ICD-10-CM | POA: Diagnosis not present

## 2015-03-06 DIAGNOSIS — J90 Pleural effusion, not elsewhere classified: Secondary | ICD-10-CM | POA: Insufficient documentation

## 2015-03-06 MED ORDER — LIDOCAINE HCL (PF) 1 % IJ SOLN
INTRAMUSCULAR | Status: DC
Start: 2015-03-06 — End: 2015-03-07
  Filled 2015-03-06: qty 10

## 2015-03-06 NOTE — Procedures (Signed)
Successful US guided left thoracentesis. Yielded 800 ml of serous fluid. Pt tolerated procedure well. No immediate complications.  Specimen was not sent for labs. CXR ordered.  Tsosie Billing D PA-C 03/06/2015 2:36 PM

## 2015-03-07 ENCOUNTER — Other Ambulatory Visit: Payer: Self-pay

## 2015-03-07 DIAGNOSIS — C50511 Malignant neoplasm of 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: Secondary | ICD-10-CM

## 2015-03-07 MED ORDER — OXYCODONE-ACETAMINOPHEN 5-325 MG PO TABS: 1.0000 | ORAL_TABLET | Freq: Three times a day (TID) | ORAL | Status: DC | PRN

## 2015-03-07 MED ORDER — OXYCODONE-ACETAMINOPHEN 5-325 MG PO TABS
1.0000 | ORAL_TABLET | Freq: Three times a day (TID) | ORAL | Status: DC | PRN
Start: 2015-03-07 — End: 2015-03-13

## 2015-03-07 NOTE — Telephone Encounter (Signed)
Let pt know Rx would be ready to pick up after lunch.  Pt voiced understanding.

## 2015-03-09 ENCOUNTER — Telehealth: Payer: Self-pay

## 2015-03-09 ENCOUNTER — Ambulatory Visit: Payer: Commercial Managed Care - HMO | Admitting: Hematology and Oncology

## 2015-03-09 NOTE — Telephone Encounter (Signed)
LMOVM - returning pt call.  Next appt for zometa 12/16.  Pt to call clinic if she has any questions.

## 2015-03-13 ENCOUNTER — Other Ambulatory Visit: Payer: Self-pay | Admitting: Cardiothoracic Surgery

## 2015-03-13 ENCOUNTER — Other Ambulatory Visit (HOSPITAL_BASED_OUTPATIENT_CLINIC_OR_DEPARTMENT_OTHER): Payer: Commercial Managed Care - HMO

## 2015-03-13 ENCOUNTER — Ambulatory Visit: Payer: Commercial Managed Care - HMO

## 2015-03-13 ENCOUNTER — Ambulatory Visit (HOSPITAL_BASED_OUTPATIENT_CLINIC_OR_DEPARTMENT_OTHER): Payer: Commercial Managed Care - HMO | Admitting: Hematology and Oncology

## 2015-03-13 ENCOUNTER — Encounter: Payer: Self-pay | Admitting: Radiation Oncology

## 2015-03-13 ENCOUNTER — Encounter: Payer: Self-pay | Admitting: Hematology and Oncology

## 2015-03-13 ENCOUNTER — Ambulatory Visit (HOSPITAL_BASED_OUTPATIENT_CLINIC_OR_DEPARTMENT_OTHER): Payer: Commercial Managed Care - HMO

## 2015-03-13 ENCOUNTER — Other Ambulatory Visit: Payer: Self-pay | Admitting: *Deleted

## 2015-03-13 VITALS — BP 142/90 | HR 112 | Temp 98.0°F | Resp 18 | Ht 61.0 in | Wt 159.1 lb

## 2015-03-13 DIAGNOSIS — J910 Malignant pleural effusion: Secondary | ICD-10-CM | POA: Insufficient documentation

## 2015-03-13 DIAGNOSIS — G893 Neoplasm related pain (acute) (chronic): Secondary | ICD-10-CM

## 2015-03-13 DIAGNOSIS — J90 Pleural effusion, not elsewhere classified: Secondary | ICD-10-CM

## 2015-03-13 DIAGNOSIS — C50511 Malignant neoplasm of 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: Secondary | ICD-10-CM

## 2015-03-13 DIAGNOSIS — C7951 Secondary malignant neoplasm of bone: Secondary | ICD-10-CM | POA: Diagnosis not present

## 2015-03-13 DIAGNOSIS — Z95828 Presence of other vascular implants and grafts: Secondary | ICD-10-CM

## 2015-03-13 DIAGNOSIS — C787 Secondary malignant neoplasm of liver and intrahepatic bile duct: Secondary | ICD-10-CM

## 2015-03-13 DIAGNOSIS — Z452 Encounter for adjustment and management of vascular access device: Secondary | ICD-10-CM

## 2015-03-13 DIAGNOSIS — E46 Unspecified protein-calorie malnutrition: Secondary | ICD-10-CM

## 2015-03-13 DIAGNOSIS — D6481 Anemia due to antineoplastic chemotherapy: Secondary | ICD-10-CM

## 2015-03-13 DIAGNOSIS — T451X5A Adverse effect of antineoplastic and immunosuppressive drugs, initial encounter: Secondary | ICD-10-CM

## 2015-03-13 DIAGNOSIS — C778 Secondary and unspecified malignant neoplasm of lymph nodes of multiple regions: Secondary | ICD-10-CM

## 2015-03-13 DIAGNOSIS — C50011 Malignant neoplasm of nipple and areola, right female breast: Secondary | ICD-10-CM

## 2015-03-13 LAB — COMPREHENSIVE METABOLIC PANEL
ALT: 12 U/L (ref 0–55)
ANION GAP: 12 meq/L — AB (ref 3–11)
AST: 43 U/L — ABNORMAL HIGH (ref 5–34)
Albumin: 3.1 g/dL — ABNORMAL LOW (ref 3.5–5.0)
Alkaline Phosphatase: 99 U/L (ref 40–150)
BUN: 10.3 mg/dL (ref 7.0–26.0)
CALCIUM: 9.3 mg/dL (ref 8.4–10.4)
CHLORIDE: 108 meq/L (ref 98–109)
CO2: 21 meq/L — AB (ref 22–29)
Creatinine: 1 mg/dL (ref 0.6–1.1)
EGFR: 73 mL/min/{1.73_m2} — AB (ref >90)
Glucose: 82 mg/dl (ref 70–140)
Potassium: 3.9 mEq/L (ref 3.5–5.1)
Sodium: 140 mEq/L (ref 136–145)
Total Bilirubin: 0.58 mg/dL (ref 0.20–1.20)
Total Protein: 7.1 g/dL (ref 6.4–8.3)

## 2015-03-13 LAB — CBC WITH DIFFERENTIAL/PLATELET
BASO%: 1 % (ref 0.0–2.0)
BASOS ABS: 0 10*3/uL (ref 0.0–0.1)
EOS ABS: 0.1 10*3/uL (ref 0.0–0.5)
EOS%: 1.9 % (ref 0.0–7.0)
HCT: 30.9 % — ABNORMAL LOW (ref 34.8–46.6)
HGB: 10 g/dL — ABNORMAL LOW (ref 11.6–15.9)
LYMPH%: 9.9 % — AB (ref 14.0–49.7)
MCH: 26.7 pg (ref 25.1–34.0)
MCHC: 32.3 g/dL (ref 31.5–36.0)
MCV: 82.4 fL (ref 79.5–101.0)
MONO#: 0.2 10*3/uL (ref 0.1–0.9)
MONO%: 4.7 % (ref 0.0–14.0)
NEUT#: 3.6 10*3/uL (ref 1.5–6.5)
NEUT%: 82.5 % — AB (ref 38.4–76.8)
Platelets: 342 10*3/uL (ref 145–400)
RBC: 3.75 10*6/uL (ref 3.70–5.45)
RDW: 24.6 % — ABNORMAL HIGH (ref 11.2–14.5)
WBC: 4.3 10*3/uL (ref 3.9–10.3)
lymph#: 0.4 10*3/uL — ABNORMAL LOW (ref 0.9–3.3)

## 2015-03-13 MED ORDER — HYDROMORPHONE HCL 4 MG/ML IJ SOLN
2.0000 mg | Freq: Once | INTRAMUSCULAR | Status: AC
Start: 2015-03-13 — End: 2015-03-13
  Administered 2015-03-13: 2 mg via INTRAVENOUS

## 2015-03-13 MED ORDER — HEPARIN SOD (PORK) LOCK FLUSH 100 UNIT/ML IV SOLN
500.0000 [IU] | Freq: Once | INTRAVENOUS | Status: AC
  Administered 2015-03-13: 500 [IU] via INTRAVENOUS
  Filled 2015-03-13: qty 5

## 2015-03-13 MED ORDER — HYDROMORPHONE HCL 4 MG/ML IJ SOLN
INTRAMUSCULAR | Status: AC
  Filled 2015-03-13: qty 1

## 2015-03-13 MED ORDER — SODIUM CHLORIDE 0.9 % IJ SOLN
10.0000 mL | INTRAMUSCULAR | Status: DC | PRN
  Administered 2015-03-13: 10 mL via INTRAVENOUS
  Filled 2015-03-13: qty 10

## 2015-03-13 MED ORDER — OXYCODONE-ACETAMINOPHEN 5-325 MG PO TABS: 1.0000 | ORAL_TABLET | Freq: Three times a day (TID) | ORAL | Status: AC | PRN

## 2015-03-13 MED ORDER — LEVOFLOXACIN 750 MG PO TABS: 750.0000 mg | ORAL_TABLET | Freq: Every day | ORAL | Status: AC

## 2015-03-13 NOTE — Patient Instructions (Signed)
Rise from sitting or laying slowly so you do not get dizzy for the rest of the day

## 2015-03-13 NOTE — Progress Notes (Addendum)
Histology and Location of Primary Cancer: Right Breast with Bone Mets  Sites of Visceral and Bony Metastatic Disease: Left Shoulder  Location(s) of Symptomatic Metastases: Left Shoulder:  Seen on 12/18/14 Pet Scan: New skeletal metastasis involving the LEFT shoulder, cyst lumbar spine, iliac bone as well as the RIGHT femur.  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  Has had a thorasentesis x 2 and will have a pleurx catheter inserted, date unknown at this time.  Evaluated by Dr. Tharon Aquas Tright today.  Note SOB when talking,and standing however O2 sat 100% after weighing  BP 124/77 mmHg  Pulse 118  Temp(Src) 98.1 F (36.7 C)  Resp 20  Ht '5\' 1"'  (1.549 m)  Wt 161 lb 1.6 oz (73.074 kg)  BMI 30.46 kg/m2  SpO2 100%  If Spine Met(s), symptoms, if any, include:  Bowel/Bladder retention or incontinence (please describe):No voiced concerns  Numbness or weakness in extremities (please describe): Mild numbness in left hand  Current Decadron regimen, if applicable: None  Ambulatory status? Walker? Wheelchair?: Wheelchair  SAFETY ISSUES:  Prior radiation?07/04/2014  through 08/23/2014:  Right chest wall and regional lymph nodes 5040 cGy in 28 sessions. Right chest wall/ mastectomy scar boost 1000 cGy 5 sessions, recurrent right chest wall nodule boost 600 cGy in 3 sessions.  Pacemaker/ICD? no  Possible current pregnancy? N/a  Is the patient on methotrexate? no  Current Complaints / other details:   Sister diagnosed with Bladder Cancer

## 2015-03-13 NOTE — Progress Notes (Signed)
Appt made for pt to see Dr. Prescott Gum 12/7 - cxr at GI at 1030, then appt with Dr Nils Pyle at 23.  Notified pt appt d/t and address - she voiced understanding.  Msg rcvd from [facility].  Dr. Valere Dross can see pt 12/7 afternoon.  Santiago Glad will call pt and provide time.

## 2015-03-13 NOTE — Patient Instructions (Signed)

## 2015-03-13 NOTE — Progress Notes (Signed)
Patient Care Team: Iona Beard, MD as PCP - General (Family Medicine) Autumn Messing III, MD as Consulting Physician (General Surgery) Nicholas Lose, MD as Consulting Physician (Hematology and Oncology) Arloa Koh, MD as Consulting Physician (Radiation Oncology)  DIAGNOSIS: Breast cancer of 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 [facility])   Staging form: Breast, AJCC 7th Edition     Clinical: Stage IV (T4b, N1, M1) - Signed by Rulon Eisenmenger, MD on 12/30/2013     Pathologic: No stage assigned - Unsigned   SUMMARY OF ONCOLOGIC HISTORY:   Breast cancer of 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 ([place])   12/01/2013 Initial Diagnosis Breast cancer of 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   12/06/2013 Breast MRI Enhancing tumor throughout the right breast all quadrants with diffuse skin thickening 11.5 x 11 x 5 cm: Spiculated mass posterior UOQ right breast 2.6 x 2.5 x 2 cm and abuts pectoralis muscle: 2 abnormal axillary lymph nodes largest 2.2 cm   12/20/2013 PET scan Stage IV disease noted with 1. Diffuse hypermetabolic right breast inflammatory carcinoma.  Metastatic lymphadenopathy in the right axilla, mediastinum, and bilateral hilar regions, small bilateral pulmonary metastases, possible 9th rib metastases   12/30/2013 - 04/14/2014 Chemotherapy Taxotere and cytoxan given on day 1 of a 21 day cycle with neulasta given on day 2 for granulocyte support.  A total of 6 cycles are planned with CT chest and mammo/ultrasound planned after cycle 3.    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 Valere Dross to chest wall and axilla   08/23/2014 -  Anti-estrogen oral therapy Anastrozole 1 mg daily   12/14/2014 Imaging MRI right shoulder: Bone metastases involving the glenoid and coracoid periostitis and possible extraosseous ext of tumor causing soft tissue edema, ? nondisplaced pathologic coracoid base fracture, lesions in proximal humerus, ext left axillary lymphaden   12/14/2014 Relapse/Recurrence Left Axill LN Biopsy: Metastatic cancer Er 20%, PR 0%, Her 2 Neg   12/18/2014 PET scan Widespread progression, Left Axill LN, Sub pectoralis LN, Bil Hilar LN,  RUL Lung nodule, RML nodule, RUL nodule, Rt Effusion, Musculature Rt chest wall, Bone mets, Liver mets   12/22/2014 - 02/09/2015 Chemotherapy Palliative chemotherapy with Halaven day 1 day 8 every 3 weeks 3 cycles, CT scans 02/19/2015 show stable disease without any response in the liver   02/19/2015 Imaging Ct [facility]: Stable left axillary and sub pect LN mets, stable medial Rt chest wall mets, Dec Pleural eff, Inc Left Eff, Dec Lung nodules, No sign change in liver mets (difficult to assess), bone mets not well seen   03/05/2015 -  Anti-estrogen oral therapy Ibrance 3 weeks on 1 week off plus letrozole daily    CHIEF COMPLIANT: worsening shortness of breath and decreased appetite  INTERVAL HISTORY: Karen Douglas is a 59 year old with above-mentioned history metastatic breast cancer  who was started on Ibrance with letrozole on 03/05/2015. She has had recurrent pleural effusions. She underwent thoracentesis twice so far. Last one was on 02/25/2015. It appears that she is again short of breath and might require another thoracentesis. She is here today  accompanied by her husband and her daughter discuss the care plan. They both report that she has not been eating much and has very poor appetite as well as taste. Basically coming back slowly but her appetite has not been coming back.she is also complaining of a cough with phlegm. She is also complaining of severe left shoulder pain which is causing her to be tearful today. She normally takes Percocets which seemed to give her some relief.  REVIEW OF SYSTEMS:   Constitutional: fatigue loss of taste and appetite Eyes: Denies blurriness of vision Ears, nose, mouth, throat, and face: Denies mucositis or sore throat Respiratory: severe shortness of breath, cough with expectoration Cardiovascular: Denies palpitation, chest discomfort or lower extremity swelling Gastrointestinal:  Denies nausea, heartburn or change in bowel habits Skin: Denies abnormal skin rashes Neurological:severe pain in the left shoulder Behavioral/Psych: Mood is stable, no new changes   All other systems were reviewed with the patient and are negative.  I have reviewed the past medical history, past surgical history, social history and family history with the patient and they are unchanged from previous note.  ALLERGIES:  has No Known Allergies.  MEDICATIONS:  Current Outpatient Prescriptions  Medication Sig Dispense Refill  . atorvastatin (LIPITOR) 20 MG tablet Take 20 mg by mouth daily.    . calcium carbonate (OS-CAL) 600 MG TABS tablet Take 600 mg by mouth daily with breakfast.    . letrozole (FEMARA) 2.5 MG tablet Take 1 tablet (2.5 mg total) by mouth daily. 90 tablet 3  . levofloxacin (LEVAQUIN) 750 MG tablet Take 1 tablet (750 mg total) by mouth daily. 10 tablet 0  . lidocaine-prilocaine (EMLA) cream Apply to affected area once 30 g 3  . ondansetron (ZOFRAN) 8 MG tablet Take 1 tablet (8 mg total) by mouth 2 (two) times daily. Start the day after chemo for 2 days. Then take as needed for nausea or vomiting. 30 tablet  1  . oxyCODONE-acetaminophen (PERCOCET/ROXICET) 5-325 MG tablet Take 1 tablet by mouth every 8 (eight) hours as needed for severe pain. for pain 60 tablet 0  . palbociclib (IBRANCE) 125 MG capsule Take 1 capsule (125 mg total) by mouth daily with breakfast. Take whole with food. 21 capsule 0  . polyethylene glycol (MIRALAX / GLYCOLAX) packet Take 17 g by mouth daily as needed for mild constipation.    . potassium chloride SA (K-DUR,KLOR-CON) 20 MEQ tablet Take 1 tablet (20 mEq total) by mouth daily. 10 tablet 0  . prochlorperazine (COMPAZINE) 10 MG tablet Take 1 tablet (10 mg total) by mouth every 6 (six) hours as needed (Nausea or vomiting). 30 tablet 1   Current Facility-Administered Medications  Medication Dose Route Frequency Provider Last Rate Last Dose  . HYDROmorphone (DILAUDID) injection 2 mg  2 mg Intravenous Once Nicholas Lose, MD       Facility-Administered Medications Ordered in Other Visits  Medication Dose Route Frequency Provider Last Rate Last Dose  . sodium chloride 0.9 % injection 10 mL  10 mL Intravenous PRN Nicholas Lose, MD   10 mL at 02/13/15 1359  . sodium chloride 0.9 % injection 10 mL  10 mL Intravenous PRN Nicholas Lose, MD   10 mL at 03/13/15 [phone]  PHYSICAL EXAMINATION: ECOG PERFORMANCE STATUS: 1 - Symptomatic but completely ambulatory  Filed Vitals:   03/13/15 0848  BP: 142/90  Pulse: 112  Temp: 98 F (36.7 C)  Resp: 18   Filed Weights   03/13/15 0848  Weight: 159 lb 1.6 oz (72.167 kg)    GENERAL:alert, no distress and comfortable SKIN: skin color, texture, turgor are normal, no rashes or significant lesions EYES: normal, Conjunctiva are pink and non-injected, sclera clear OROPHARYNX:no exudate, no erythema and lips, buccal mucosa, and tongue normal  NECK: supple, thyroid normal size, non-tender, without nodularity LYMPH:  no palpable lymphadenopathy in the cervical, axillary or inguinal LUNGS: left pleural effusion HEART: regular rate & rhythm and  no murmurs and no lower extremity edema ABDOMEN:abdomen soft, non-tender and normal bowel sounds Musculoskeletal:no cyanosis of digits and no clubbing  NEURO: alert & oriented x 3 with fluent speech, no focal motor/sensory deficits  LABORATORY DATA:  I have reviewed the data as listed   Chemistry      Component Value Date/Time   NA 140 03/13/2015 0829   NA 140 05/17/2014 0544   K 3.9 03/13/2015 0829   K 4.1 05/17/2014 0544   CL 111 05/17/2014 0544   CO2 21* 03/13/2015 0829   CO2 24 05/17/2014 0544   BUN 10.3 03/13/2015 0829   BUN 13 05/17/2014 0544   CREATININE 1.0 03/13/2015 0829   CREATININE 0.81 05/17/2014 0544      Component Value Date/Time   CALCIUM 9.3 03/13/2015 0829   CALCIUM 8.3* 05/17/2014 0544   ALKPHOS 99 03/13/2015 0829   AST 43* 03/13/2015 0829   ALT 12 03/13/2015 0829   BILITOT 0.58 03/13/2015 0829       Lab Results  Component Value Date   WBC 4.3 03/13/2015   HGB 10.0* 03/13/2015   HCT 30.9* 03/13/2015   MCV 82.4 03/13/2015   PLT 342 03/13/2015   NEUTROABS 3.6 03/13/2015   ASSESSMENT & PLAN:  Breast cancer of 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 ([place]) 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, 3/4 lymph nodes positive, lymphovascular invasion present, ER 99%, PR 51%, HER-2 negative, Ki-67 95% T3 N1 stage IIIa, completed radiation therapy 08/15/2014, started anastrozole 08/23/2014 stopped 12/18/2014 for progression  PET CT 12/18/14: Widespread progression, Left Axill LN, Sub pectoralis LN, Bil Hilar LN, RUL Lung nodule, RML nodule, RUL nodule, Rt Effusion, Musculature Rt chest  wall, Bone mets, Liver mets  CT chest abdomen pelvis 02/19/2015: Stable left axillary and sub pect LN mets, stable medial Rt chest wall mets, Dec Pleural eff, Inc Left Eff, Dec Lung nodules, No sign change in liver mets (difficult to assess), bone mets not well seen  Prior Treatment: Palliative systemic chemotherapy with Halaven 12/22/2014 D1 day 8 every 3 weeks 3 cycles with not much significant reduction in the liver lesions. Hence we decided to stop Halaven.  Current treatment: Ibrance with letrozole started 03/05/2015  Pleural effusion: Status post thoracentesis on 02/13/2015 and 03/06/15 /positive for breast cancer. Patient is once again extremely short of breath and will need another thoracentesis. I recommended that she get the Pleurx catheter placed.  Bone Mets: Zometa with calcium and vitamin D. Patient  tells me that she needs teeth extraction sometime in the spring of 2017. If that is the case we will stop Zometa after December. We will change this to every 4 weeks Anemia related to prior chemotherapy: Hemoglobin is 10 today. Ibrance  toxicities: occasional itching of the skin otherwise no side effects so far Lab work was reviewed. Plan to give her 3 months of this treatment followed by CT scans. Return to clinic 3 weeks for cycle 2 of Ibrance  Severe left shoulder pain: Related to bone metastases. I recommended that she see radiation oncology for palliative radiation to the shoulder.  CODE STATUS: I discussed the patient and her family about CODE STATUS. I encouraged them to think about [facility] but the family decided to keep her as full code. I will also recommend palliative care to see her. I will discuss this with her the next appointment and then make the referral.  No orders of the defined types were placed in this encounter.   The patient has a good understanding of the overall plan. she agrees with it. she will call with any problems that may develop before the next visit  here.   Rulon Eisenmenger, MD 03/13/2015

## 2015-03-13 NOTE — Assessment & Plan Note (Signed)
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, 3/4 lymph nodes positive, lymphovascular invasion present, ER 99%, PR 51%, HER-2 negative, Ki-67 95% T3 N1 stage IIIa, completed radiation therapy 08/15/2014, started anastrozole 08/23/2014 stopped 12/18/2014 for progression  PET CT 12/18/14: Widespread progression, Left Axill LN, Sub pectoralis LN, Bil Hilar LN, RUL Lung nodule, RML nodule, RUL nodule, Rt Effusion, Musculature Rt chest wall, Bone mets, Liver mets  CT chest abdomen pelvis 02/19/2015: Stable left axillary and sub pect LN mets, stable medial Rt chest wall mets, Dec Pleural eff, Inc Left Eff, Dec Lung nodules, No sign change in liver mets (difficult to assess), bone mets not well seen  Prior Treatment: Palliative systemic chemotherapy with Halaven 12/22/2014 D1 day 8 every 3 weeks 3 cycles with not much significant reduction in the liver lesions. Hence we decided to stop Halaven.  Current treatment: Ibrance with letrozole started 03/05/2015  Pleural effusion: Status post thoracentesis on 02/13/2015 and 03/06/15 /positive for breast cancer with improvement in her symptoms  Bone Mets: Zometa with calcium and vitamin D. Patient tells me that she needs teeth extraction sometime in the spring of 2017. If that is the case we will stop Zometa after December. We will change this to every 4 weeks  Ibrance  toxicities:   Plan to give her 3 months of this treatment followed by CT scans. Return to clinic 3 weeks for cycle 2 of Ibrance

## 2015-03-13 NOTE — Progress Notes (Signed)
Pt presented to infusion room for port access, 2 mg dilaudid ivp, and dc home.

## 2015-03-13 NOTE — Addendum Note (Signed)
Addended by: Prentiss Bells on: 03/13/2015 12:36 PM   Modules accepted: Medications

## 2015-03-14 ENCOUNTER — Ambulatory Visit
Admission: RE | Admit: 2015-03-14 | Discharge: 2015-03-14 | Disposition: A | Discharge status: Home / Self Care | Payer: Commercial Managed Care - HMO | Source: Ambulatory Visit | Attending: Radiation Oncology | Admitting: Radiation Oncology

## 2015-03-14 ENCOUNTER — Ambulatory Visit
Admission: RE | Admit: 2015-03-14 | Discharge: 2015-03-14 | Disposition: A | Discharge status: Home / Self Care | Payer: Commercial Managed Care - HMO | Source: Ambulatory Visit | Attending: Cardiothoracic Surgery | Admitting: Cardiothoracic Surgery

## 2015-03-14 ENCOUNTER — Other Ambulatory Visit: Payer: Self-pay | Admitting: *Deleted

## 2015-03-14 ENCOUNTER — Encounter: Payer: Self-pay | Admitting: Cardiothoracic Surgery

## 2015-03-14 ENCOUNTER — Other Ambulatory Visit: Payer: Commercial Managed Care - HMO

## 2015-03-14 ENCOUNTER — Encounter: Payer: Self-pay | Admitting: Radiation Oncology

## 2015-03-14 ENCOUNTER — Ambulatory Visit (INDEPENDENT_AMBULATORY_CARE_PROVIDER_SITE_OTHER): Payer: Commercial Managed Care - HMO | Admitting: Cardiothoracic Surgery

## 2015-03-14 VITALS — BP 124/77 | HR 118 | Temp 98.1°F | Resp 20 | Ht 61.0 in | Wt 161.1 lb

## 2015-03-14 VITALS — BP 134/92 | HR 111 | Resp 22 | Ht 61.0 in | Wt 159.0 lb

## 2015-03-14 DIAGNOSIS — Z923 Personal history of irradiation: Secondary | ICD-10-CM | POA: Diagnosis not present

## 2015-03-14 DIAGNOSIS — C50912 Malignant neoplasm of unspecified site of left female breast: Secondary | ICD-10-CM | POA: Diagnosis present

## 2015-03-14 DIAGNOSIS — Z170 Estrogen receptor positive status [ER+]: Secondary | ICD-10-CM | POA: Insufficient documentation

## 2015-03-14 DIAGNOSIS — Z510 Encounter for antineoplastic radiation therapy: Secondary | ICD-10-CM | POA: Diagnosis not present

## 2015-03-14 DIAGNOSIS — C7802 Secondary malignant neoplasm of left lung: Secondary | ICD-10-CM | POA: Insufficient documentation

## 2015-03-14 DIAGNOSIS — J90 Pleural effusion, not elsewhere classified: Secondary | ICD-10-CM

## 2015-03-14 DIAGNOSIS — C8594 Non-Hodgkin lymphoma, unspecified, lymph nodes of axilla and upper limb: Secondary | ICD-10-CM | POA: Diagnosis not present

## 2015-03-14 DIAGNOSIS — C799 Secondary malignant neoplasm of unspecified site: Secondary | ICD-10-CM | POA: Diagnosis not present

## 2015-03-14 DIAGNOSIS — J910 Malignant pleural effusion: Secondary | ICD-10-CM

## 2015-03-14 DIAGNOSIS — Z9889 Other specified postprocedural states: Secondary | ICD-10-CM | POA: Insufficient documentation

## 2015-03-14 DIAGNOSIS — J948 Other specified pleural conditions: Secondary | ICD-10-CM

## 2015-03-14 DIAGNOSIS — C7951 Secondary malignant neoplasm of bone: Secondary | ICD-10-CM | POA: Diagnosis present

## 2015-03-14 DIAGNOSIS — C50919 Malignant neoplasm of unspecified site of unspecified female breast: Secondary | ICD-10-CM | POA: Diagnosis not present

## 2015-03-14 DIAGNOSIS — C7801 Secondary malignant neoplasm of right lung: Secondary | ICD-10-CM | POA: Insufficient documentation

## 2015-03-14 HISTORY — DX: Secondary malignant neoplasm of bone: C79.51

## 2015-03-14 HISTORY — DX: Personal history of irradiation: Z92.3

## 2015-03-14 NOTE — Addendum Note (Signed)
Encounter addended by: Benn Moulder, RN on: 03/14/2015  5:54 PM<BR>     Documentation filed: Charges VN

## 2015-03-14 NOTE — Progress Notes (Signed)
CC: Dr. 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  History: I first saw Karen Douglas in consultation on 12/07/2013.  Please see Dr. Geralyn Flash note from 03/13/2015 for her past history including her initial workup, chemotherapy, surgery and adjuvant radiation therapy as discussed above.  She is currently on letrozole along with Ibrance.  Her PET scan from 12/18/2014 showed widespread progression of metastatic disease with new hypermetabolic left axillary nodal metastases in addition to recurrence in the medial right chest wall.  She was also found to have new bilateral hypermetabolic pulmonary metastases and hilar nodal metastases within the right pleural effusion.  She also had liver metastases in addition to metastatic disease to bone involving her left shoulder/glenoid, lumbar spine, iliac bone and right femur.  She tells me that she is been having worsening left shoulder discomfort for the past month.  Her pain is 8/10.  She's been taking hydrocodone/APAP every 4 hours for pain control.  She also has limitation of her left shoulder/arm extension.  More recently, she has been evaluated by Dr. Tharon Aquas Tright for pleural effusions with associated shortness of breath.  She had thoracentesis 2 that she is scheduled for placement of a Pleurx catheter this Friday.  Physical examination: Alert and oriented. Filed Vitals:   03/14/15 1513  BP: 124/77  Pulse: 118  Temp: 98.1 F (36.7 C)  Resp: 20   Chest: On inspection of the right chest wall there is residual hyperpigmentation the skin with no evidence for chest wall recurrence.  Extremities: There is palpable discomfort along her left glenoid.  There is limited left shoulder extension to 90 secondary to pain.  Neurovascular intact.  Laboratory  data: Lab Results  Component Value Date   WBC 4.3 03/13/2015   HGB 10.0* 03/13/2015   HCT 30.9* 03/13/2015   MCV 82.4 03/13/2015   PLT 342 03/13/2015   Impression: Metastatic carcinoma of the breast with significant pain secondary to involvement of her left glenoid/shoulder.  I feel that she would be a good candidate for a single fraction of palliative radiotherapy.  I will have her scheduled for CT simulation this coming Monday, December 12 and give her a single fraction of radiation therapy on December 14 or December 15.  We discussed the potential acute and late toxicities of radiation therapy which should be minimal.  Consent is signed today.  Plan: As above.  40 minutes was spent face-to-face with the patient, primarily counseling patient and coordinating her care.

## 2015-03-14 NOTE — H&P (Signed)
PCP is Maggie Font, MD Referring Provider is Nicholas Lose, MD  Chief Complaint  Patient presents with  . Pleural Effusion    Surgical eval for pleurX Cath placement/CXR...for left recurrent pl eff...has had 2 thoracenteses    HPI: Patient is a 63 neuro smoker  AA female with recurrent left malignant pleural effusion which is symptomatic. She has advanced stage adenocarcinoma of breast status post right modified radical mastectomy, chemotherapy, right chest wall radiation therapy. She now has pulmonary nodules, left shoulder and rib metastatic involvement and a blade left pleural effusion which was treated with thoracentesis last week--800 cc removed with malignant cytology. She has recurrent shortness of breath and chest x-ray today shows recurrent left moderate oral effusion. She is not on home oxygen. She's not had previous radiation therapy to the left chest. Her oncologist feels she is a candidate for a left Pleurx catheter to help modify her symptoms of shortness of breath which is appropriate.  Past Medical History  Diagnosis Date  . Headache   . Cancer ([facility]) 11/28/13 bx    breast  . Breast cancer ([facility]) 11/28/13    right  invasive mammary ca, metastatic, mammary ca in situ  . 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 Status post chemotherapy     Completed 6 cycles of Taxotere Cytoxan.  . S/P radiation therapy 07/04/2014 through 08/23/2014    Right chest wall and regional lymph nodes 5040 cGy in 28 sessions. Right chest wall/ mastectomy scar boost 1000 cGy 5 sessions, recurrent right chest wall nodule boost 600 cGy in 3 sessions.    Past Surgical History  Procedure Laterality Date  . Finger surgery    . Portacath placement N/A 12/19/2013    Procedure: INSERTION PORT-A-CATH;  Surgeon: Autumn Messing III, MD;  Location: [facility];  Service: General;  Laterality: N/A;  . Mastectomy modified radical Right 05/15/2014    Procedure: RIGHT MASTECTOMY MODIFIED RADICAL;  Surgeon: Autumn Messing  III, MD;  Location: [facility];  Service: General;  Laterality: Right;  . Abdominal hysterectomy  1988    cysts on ovaries    Family History  Problem Relation Age of Onset  . Diabetes Mother   . Heart attack Mother   . Tuberculosis Father   . Cancer Maternal Aunt     unknown type of cancer    Social History Social History  Substance Use Topics  . Smoking status: Never Smoker   . Smokeless tobacco: Former Systems developer    Types: Snuff    Quit date: 12/10/2013  . Alcohol Use: 0.0 oz/week    0 Standard drinks or equivalent per week     Comment: weekend usage  2-3 d   none since 8/15    Current Outpatient Prescriptions  Medication Sig Dispense Refill  . atorvastatin (LIPITOR) 20 MG tablet Take 20 mg by mouth daily.    . calcium carbonate (OS-CAL) 600 MG TABS tablet Take 600 mg by mouth daily with breakfast.    . letrozole (FEMARA) 2.5 MG tablet Take 1 tablet (2.5 mg total) by mouth daily. 90 tablet 3  . levofloxacin (LEVAQUIN) 750 MG tablet Take 1 tablet (750 mg total) by mouth daily. 10 tablet 0  . lidocaine-prilocaine (EMLA) cream Apply to affected area once 30 g 3  . ondansetron (ZOFRAN) 8 MG tablet Take 1 tablet (8 mg total) by mouth 2 (two) times daily. Start the day after chemo for 2 days. Then take as needed for nausea or  vomiting. 30 tablet 1  . oxyCODONE-acetaminophen (PERCOCET/ROXICET) 5-325 MG tablet Take 1 tablet by mouth every 8 (eight) hours as needed for severe pain. for pain 60 tablet 0  . palbociclib (IBRANCE) 125 MG capsule Take 1 capsule (125 mg total) by mouth daily with breakfast. Take whole with food. 21 capsule 0  . potassium chloride SA (K-DUR,KLOR-CON) 20 MEQ tablet Take 1 tablet (20 mEq total) by mouth daily. 10 tablet 0  . prochlorperazine (COMPAZINE) 10 MG tablet Take 1 tablet (10 mg total) by mouth every 6 (six) hours as needed (Nausea or vomiting). 30 tablet 1  . polyethylene glycol (MIRALAX / GLYCOLAX) packet Take 17 g by mouth daily as needed for mild  constipation.     No current facility-administered medications for this visit.   Facility-Administered Medications Ordered in Other Visits  Medication Dose Route Frequency Provider Last Rate Last Dose  . sodium chloride 0.9 % injection 10 mL  10 mL Intravenous PRN Nicholas Lose, MD   10 mL at 02/13/15 1359    No Known Allergies  Review of Systems         Review of Systems :  [ y ] = yes, [  ] = no        General :  Weight gain [   ]    Weight loss  [  yes ]  Fatigue [ yes ]  Fever [  ]  Chills  [  ]                                Weakness  [place]  ]           Cardiac :  Chest pain/ pressure [ yes-left shoulder pain ]  Resting SOB [ yes ] exertional SOB [ yes ]                        Orthopnea [ yes sleeps on 2 pillows ]  Pedal edema  [  ]  Palpitations [  ] Syncope/presyncope [ ]                         Paroxysmal nocturnal dyspnea [  ]        Pulmonary : cough [yes dry  ]  wheezing [  ]  Hemoptysis [  ] Sputum [  ] Snoring [  ]                              Pneumothorax [  ]  Sleep apnea [  ]       GI : Vomiting [  ]  Dysphagia [  ]  Melena  [  ]  Abdominal pain [  ] BRBPR [  ]              Heart burn [  ]  Constipation [  ] Diarrhea  [  ] Colonoscopy [  ]       GU : Hematuria [  ]  Dysuria [  ]  Nocturia [  ] UTI's [  ]       Vascular : Claudication [  ]  Rest pain [  ]  DVT [  ] Vein stripping [  ] leg ulcers [  ]  TIA [  ] Stroke [  ]  Varicose veins [  ]       NEURO :  Headaches  [  ] Seizures [  ] Vision changes [  ] Paresthesias [  ]       Musculoskeletal :  Arthritis [  ] Gout  [  ]  Back pain [  ]  Joint pain [  ]lymphedema right upper remedy       Skin :  Rash [  ]  Melanoma [  ]        Heme : Bleeding problems [  ]Clotting Disorders [  ] Anemia [ yes ]Blood Transfusion [ ]        Endocrine : Diabetes [  ] Thyroid Disorder  [  ]       Psych : Depression [  ]  Anxiety [  ]  Psych hospitalizations [  ]                                               BP  134/92 mmHg  Pulse 111  Resp 22  Ht 5\' 1"  (1.549 m)  Wt 159 lb (72.122 kg)  BMI 30.06 kg/m2  SpO2 91% Physical Exam      Physical Exam  General:  Middle-aged AA female accompanied by husband appears weak, chronically ill  HEENT: Normocephalic pupils equal , dentition adequate Neck: Supple without JVD, adenopathy, or bruit Chest: Clear to auscultation, decreased left-sided breath sounds, no rhonchi, no tenderness. Status post right modified radical mastectomy              Cardiovascular: Regular rate and rhythm,increased heart rate, no murmur, no gallop, peripheral pulses             palpable in all extremities Abdomen:  Soft, nontender, no palpable mass or organomegaly Extremities: Warm, well-perfused, no clubbing cyanosis edema or tenderness,              no venous stasis changes of the legs Rectal/GU: Deferred Neuro: Grossly non--focal and symmetrical throughout Skin: Clean and dry without rash or ulceration   Diagnostic Tests: Chest x-ray, CT scan of chest personally and independently reviewed showing left pleural effusion  Impression: Recurrent malignant left pleural effusion from advanced adenocarcinoma the breast  Agree that Pleurx catheter placement would help palliate symptoms of shortness of breath and reduce need for repeated thoracentesis procedures  Plan:we'll plan left Pleurx catheter placement at [facility] a.m. December 9. Procedure indications benefits alternatives and risks discussed with patient and husband. They agree to proceed.  Len Childs, MD [facility] [phone]

## 2015-03-15 ENCOUNTER — Encounter (HOSPITAL_COMMUNITY): Payer: Self-pay | Admitting: *Deleted

## 2015-03-15 MED ORDER — DEXTROSE 5 % IV SOLN
1.5000 g | INTRAVENOUS | Status: AC
  Administered 2015-03-16: 1.5 g via INTRAVENOUS
  Filled 2015-03-15: qty 1.5

## 2015-03-15 NOTE — Progress Notes (Signed)
Pt denies cardiac history or chest pain. Does have sob due to pleural effusion.

## 2015-03-16 ENCOUNTER — Ambulatory Visit (HOSPITAL_COMMUNITY): Payer: Commercial Managed Care - HMO

## 2015-03-16 ENCOUNTER — Ambulatory Visit (HOSPITAL_COMMUNITY): Payer: Commercial Managed Care - HMO | Admitting: Anesthesiology

## 2015-03-16 ENCOUNTER — Encounter (HOSPITAL_COMMUNITY): Payer: Self-pay | Admitting: Surgery

## 2015-03-16 ENCOUNTER — Encounter (HOSPITAL_COMMUNITY)
Admission: RE | Disposition: A | Discharge status: Home / Self Care | Payer: Self-pay | Source: Ambulatory Visit | Attending: Cardiothoracic Surgery

## 2015-03-16 ENCOUNTER — Ambulatory Visit (HOSPITAL_COMMUNITY)
Admission: RE | Admit: 2015-03-16 | Discharge: 2015-03-16 | Disposition: A | Discharge status: Home / Self Care | Payer: Commercial Managed Care - HMO | Source: Ambulatory Visit | Attending: Cardiothoracic Surgery | Admitting: Cardiothoracic Surgery

## 2015-03-16 DIAGNOSIS — D649 Anemia, unspecified: Secondary | ICD-10-CM | POA: Diagnosis not present

## 2015-03-16 DIAGNOSIS — Z419 Encounter for procedure for purposes other than remedying health state, unspecified: Secondary | ICD-10-CM

## 2015-03-16 DIAGNOSIS — C7951 Secondary malignant neoplasm of bone: Secondary | ICD-10-CM | POA: Diagnosis not present

## 2015-03-16 DIAGNOSIS — Z79899 Other long term (current) drug therapy: Secondary | ICD-10-CM | POA: Diagnosis not present

## 2015-03-16 DIAGNOSIS — J910 Malignant pleural effusion: Secondary | ICD-10-CM | POA: Insufficient documentation

## 2015-03-16 DIAGNOSIS — R0602 Shortness of breath: Secondary | ICD-10-CM

## 2015-03-16 DIAGNOSIS — Z90710 Acquired absence of both cervix and uterus: Secondary | ICD-10-CM | POA: Insufficient documentation

## 2015-03-16 DIAGNOSIS — J95811 Postprocedural pneumothorax: Secondary | ICD-10-CM | POA: Diagnosis not present

## 2015-03-16 DIAGNOSIS — C50911 Malignant neoplasm of unspecified site of right female breast: Secondary | ICD-10-CM | POA: Diagnosis not present

## 2015-03-16 DIAGNOSIS — Z9221 Personal history of antineoplastic chemotherapy: Secondary | ICD-10-CM | POA: Insufficient documentation

## 2015-03-16 DIAGNOSIS — Z923 Personal history of irradiation: Secondary | ICD-10-CM | POA: Diagnosis not present

## 2015-03-16 DIAGNOSIS — Z9011 Acquired absence of right breast and nipple: Secondary | ICD-10-CM | POA: Insufficient documentation

## 2015-03-16 DIAGNOSIS — C50511 Malignant neoplasm of 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: Secondary | ICD-10-CM | POA: Diagnosis not present

## 2015-03-16 DIAGNOSIS — Z87891 Personal history of nicotine dependence: Secondary | ICD-10-CM | POA: Diagnosis not present

## 2015-03-16 HISTORY — PX: CHEST TUBE INSERTION: SHX231

## 2015-03-16 HISTORY — DX: Reserved for inherently not codable concepts without codable children: IMO0001

## 2015-03-16 HISTORY — DX: Constipation, unspecified: K59.00

## 2015-03-16 LAB — GLUCOSE, CAPILLARY: Glucose-Capillary: 93 mg/dL (ref 65–99)

## 2015-03-16 LAB — APTT: aPTT: 31 seconds (ref 24–37)

## 2015-03-16 LAB — PROTIME-INR
INR: 1.21 (ref 0.00–1.49)
Prothrombin Time: 15.5 seconds — ABNORMAL HIGH (ref 11.6–15.2)

## 2015-03-16 SURGERY — INSERTION, PLEURAL DRAINAGE CATHETER
Anesthesia: Monitor Anesthesia Care | Site: Chest | Laterality: Left

## 2015-03-16 MED ORDER — PROPOFOL 10 MG/ML IV BOLUS
INTRAVENOUS | Status: DC | PRN
  Administered 2015-03-16: 20 mg via INTRAVENOUS
  Administered 2015-03-16: 10 mg via INTRAVENOUS

## 2015-03-16 MED ORDER — ONDANSETRON HCL 4 MG/2ML IJ SOLN
INTRAMUSCULAR | Status: AC
  Filled 2015-03-16: qty 6

## 2015-03-16 MED ORDER — FENTANYL CITRATE (PF) 100 MCG/2ML IJ SOLN
INTRAMUSCULAR | Status: DC | PRN
  Administered 2015-03-16 (×3): 25 ug via INTRAVENOUS
  Administered 2015-03-16: 50 ug via INTRAVENOUS
  Administered 2015-03-16 (×2): 25 ug via INTRAVENOUS

## 2015-03-16 MED ORDER — LIDOCAINE HCL (CARDIAC) 20 MG/ML IV SOLN
INTRAVENOUS | Status: AC
  Filled 2015-03-16: qty 15

## 2015-03-16 MED ORDER — PROMETHAZINE HCL 25 MG/ML IJ SOLN: 6.2500 mg | INTRAMUSCULAR | Status: DC | PRN

## 2015-03-16 MED ORDER — PROPOFOL 500 MG/50ML IV EMUL
INTRAVENOUS | Status: DC | PRN
  Administered 2015-03-16: 10 ug/kg/min via INTRAVENOUS

## 2015-03-16 MED ORDER — SODIUM CHLORIDE 0.9 % IJ SOLN
INTRAMUSCULAR | Status: AC
  Filled 2015-03-16: qty 30

## 2015-03-16 MED ORDER — FUROSEMIDE 10 MG/ML IJ SOLN
20.0000 mg | Freq: Once | INTRAMUSCULAR | Status: AC
  Administered 2015-03-16: 20 mg via INTRAVENOUS

## 2015-03-16 MED ORDER — OXYCODONE HCL 5 MG PO TABS: 5.0000 mg | ORAL_TABLET | ORAL | Status: DC | PRN

## 2015-03-16 MED ORDER — OXYCODONE-ACETAMINOPHEN 5-325 MG PO TABS: 1.0000 | ORAL_TABLET | Freq: Three times a day (TID) | ORAL | Status: DC | PRN

## 2015-03-16 MED ORDER — ROCURONIUM BROMIDE 50 MG/5ML IV SOLN
INTRAVENOUS | Status: AC
  Filled 2015-03-16: qty 2

## 2015-03-16 MED ORDER — FENTANYL CITRATE (PF) 250 MCG/5ML IJ SOLN
INTRAMUSCULAR | Status: AC
  Filled 2015-03-16: qty 5

## 2015-03-16 MED ORDER — SODIUM CHLORIDE 0.9 % IJ SOLN: 3.0000 mL | INTRAMUSCULAR | Status: DC | PRN

## 2015-03-16 MED ORDER — MIDAZOLAM HCL 5 MG/5ML IJ SOLN
INTRAMUSCULAR | Status: DC | PRN
  Administered 2015-03-16 (×2): 1 mg via INTRAVENOUS

## 2015-03-16 MED ORDER — PHENYLEPHRINE HCL 10 MG/ML IJ SOLN
INTRAMUSCULAR | Status: DC | PRN
  Administered 2015-03-16 (×3): 40 ug via INTRAVENOUS
  Administered 2015-03-16: 80 ug via INTRAVENOUS
  Administered 2015-03-16 (×2): 40 ug via INTRAVENOUS
  Administered 2015-03-16: 80 ug via INTRAVENOUS
  Administered 2015-03-16: 40 ug via INTRAVENOUS

## 2015-03-16 MED ORDER — EPHEDRINE SULFATE 50 MG/ML IJ SOLN
INTRAMUSCULAR | Status: AC
  Filled 2015-03-16: qty 3

## 2015-03-16 MED ORDER — SUCCINYLCHOLINE CHLORIDE 20 MG/ML IJ SOLN
INTRAMUSCULAR | Status: AC
  Filled 2015-03-16: qty 2

## 2015-03-16 MED ORDER — SODIUM CHLORIDE 0.9 % IV SOLN: 250.0000 mL | INTRAVENOUS | Status: DC | PRN

## 2015-03-16 MED ORDER — FENTANYL CITRATE (PF) 100 MCG/2ML IJ SOLN
25.0000 ug | INTRAMUSCULAR | Status: DC | PRN
  Administered 2015-03-16 (×3): 25 ug via INTRAVENOUS

## 2015-03-16 MED ORDER — MIDAZOLAM HCL 2 MG/2ML IJ SOLN
INTRAMUSCULAR | Status: AC
  Filled 2015-03-16: qty 2

## 2015-03-16 MED ORDER — PHENYLEPHRINE 40 MCG/ML (10ML) SYRINGE FOR IV PUSH (FOR BLOOD PRESSURE SUPPORT)
PREFILLED_SYRINGE | INTRAVENOUS | Status: AC
  Filled 2015-03-16: qty 30

## 2015-03-16 MED ORDER — 0.9 % SODIUM CHLORIDE (POUR BTL) OPTIME
TOPICAL | Status: DC | PRN
  Administered 2015-03-16: 1000 mL

## 2015-03-16 MED ORDER — ACETAMINOPHEN 325 MG PO TABS: 650.0000 mg | ORAL_TABLET | ORAL | Status: DC | PRN

## 2015-03-16 MED ORDER — PROPOFOL 10 MG/ML IV BOLUS
INTRAVENOUS | Status: AC
  Filled 2015-03-16: qty 20

## 2015-03-16 MED ORDER — FENTANYL CITRATE (PF) 100 MCG/2ML IJ SOLN: 25.0000 ug | INTRAMUSCULAR | Status: DC | PRN

## 2015-03-16 MED ORDER — FUROSEMIDE 10 MG/ML IJ SOLN
INTRAMUSCULAR | Status: AC
  Administered 2015-03-16: 20 mg via INTRAVENOUS
  Filled 2015-03-16: qty 4

## 2015-03-16 MED ORDER — SODIUM CHLORIDE 0.9 % IJ SOLN: 3.0000 mL | Freq: Two times a day (BID) | INTRAMUSCULAR | Status: DC

## 2015-03-16 MED ORDER — FENTANYL CITRATE (PF) 100 MCG/2ML IJ SOLN
INTRAMUSCULAR | Status: AC
  Administered 2015-03-16: 25 ug via INTRAVENOUS
  Filled 2015-03-16: qty 2

## 2015-03-16 MED ORDER — ACETAMINOPHEN 650 MG RE SUPP: 650.0000 mg | RECTAL | Status: DC | PRN

## 2015-03-16 MED ORDER — LACTATED RINGERS IV SOLN
INTRAVENOUS | Status: DC
  Administered 2015-03-16: 08:00:00 via INTRAVENOUS

## 2015-03-16 MED ORDER — HYDROCOD POLST-CPM POLST ER 10-8 MG/5ML PO SUER: 5.0000 mL | Freq: Two times a day (BID) | ORAL | Status: DC

## 2015-03-16 SURGICAL SUPPLY — 30 items
ADH SKN CLS APL DERMABOND .7 (GAUZE/BANDAGES/DRESSINGS) ×1
CANISTER SUCTION 2500CC (MISCELLANEOUS) ×3 IMPLANT
COVER SURGICAL LIGHT HANDLE (MISCELLANEOUS) ×3 IMPLANT
DERMABOND ADVANCED (GAUZE/BANDAGES/DRESSINGS) ×2
DERMABOND ADVANCED .7 DNX12 (GAUZE/BANDAGES/DRESSINGS) ×1 IMPLANT
DRAPE C-ARM 42X72 X-RAY (DRAPES) ×3 IMPLANT
DRAPE LAPAROSCOPIC ABDOMINAL (DRAPES) ×3 IMPLANT
GLOVE BIO SURGEON STRL SZ7.5 (GLOVE) ×6 IMPLANT
GOWN STRL REUS W/ TWL LRG LVL3 (GOWN DISPOSABLE) ×2 IMPLANT
GOWN STRL REUS W/TWL LRG LVL3 (GOWN DISPOSABLE) ×6
KIT BASIN OR (CUSTOM PROCEDURE TRAY) ×3 IMPLANT
KIT PLEURX DRAIN CATH 1000ML (MISCELLANEOUS) ×3 IMPLANT
KIT PLEURX DRAIN CATH 15.5FR (DRAIN) ×3 IMPLANT
KIT ROOM TURNOVER OR (KITS) ×3 IMPLANT
NDL HYPO 25GX1X1/2 BEV (NEEDLE) ×1 IMPLANT
NEEDLE HYPO 25GX1X1/2 BEV (NEEDLE) ×3 IMPLANT
NS IRRIG 1000ML POUR BTL (IV SOLUTION) ×3 IMPLANT
PACK GENERAL/GYN (CUSTOM PROCEDURE TRAY) ×3 IMPLANT
PAD ARMBOARD 7.5X6 YLW CONV (MISCELLANEOUS) ×6 IMPLANT
SET DRAINAGE LINE (MISCELLANEOUS) IMPLANT
SPONGE GAUZE 4X4 12PLY STER LF (GAUZE/BANDAGES/DRESSINGS) ×2 IMPLANT
SUT ETHILON 3 0 PS 1 (SUTURE) ×3 IMPLANT
SUT SILK 2 0 SH (SUTURE) ×3 IMPLANT
SUT VIC AB 3-0 SH 8-18 (SUTURE) ×3 IMPLANT
SYR CONTROL 10ML LL (SYRINGE) ×3 IMPLANT
TAPE CLOTH SURG 4X10 WHT LF (GAUZE/BANDAGES/DRESSINGS) ×2 IMPLANT
TOWEL OR 17X24 6PK STRL BLUE (TOWEL DISPOSABLE) ×3 IMPLANT
TOWEL OR 17X26 10 PK STRL BLUE (TOWEL DISPOSABLE) ×3 IMPLANT
VALVE REPLACEMENT CAP (MISCELLANEOUS) IMPLANT
WATER STERILE IRR 1000ML POUR (IV SOLUTION) ×3 IMPLANT

## 2015-03-16 NOTE — Anesthesia Preprocedure Evaluation (Addendum)
Anesthesia Evaluation  Patient identified by MRN, date of birth, ID band Patient awake    Reviewed: Allergy & Precautions, NPO status , Patient's Chart, lab work & pertinent test results  Airway Mallampati: II  TM Distance: >3 FB Neck ROM: Full    Dental  (+) Teeth Intact, Dental Advisory Given, Loose, Missing, Poor Dentition,    Pulmonary shortness of breath,  Left malignant effusion.   breath sounds clear to auscultation       Cardiovascular negative cardio ROS   Rhythm:Regular Rate:Normal     Neuro/Psych negative neurological ROS     GI/Hepatic negative GI ROS, Neg liver ROS,   Endo/Other  negative endocrine ROS  Renal/GU negative Renal ROS     Musculoskeletal   Abdominal   Peds  Hematology  (+) anemia ,   Anesthesia Other Findings   Reproductive/Obstetrics                           Anesthesia Physical Anesthesia Plan  ASA: III  Anesthesia Plan: MAC   Post-op Pain Management:    Induction: Intravenous  Airway Management Planned: Natural Airway and Simple Face Mask  Additional Equipment:   Intra-op Plan:   Post-operative Plan:   Informed Consent: I have reviewed the patients History and Physical, chart, labs and discussed the procedure including the risks, benefits and alternatives for the proposed anesthesia with the patient or authorized representative who has indicated his/her understanding and acceptance.     Plan Discussed with: CRNA  Anesthesia Plan Comments:         Anesthesia Quick Evaluation

## 2015-03-16 NOTE — Progress Notes (Signed)
[facility] here with home O2 tank.

## 2015-03-16 NOTE — Anesthesia Postprocedure Evaluation (Signed)
Anesthesia Post Note  Patient: Karen Douglas  Procedure(s) Performed: Procedure(s) (LRB): INSERTION PLEURAL DRAINAGE CATHETER (Left)  Patient location during evaluation: PACU Anesthesia Type: MAC Level of consciousness: awake and alert Pain management: pain level controlled Vital Signs Assessment: post-procedure vital signs reviewed and stable Respiratory status: patient connected to nasal cannula oxygen Cardiovascular status: blood pressure returned to baseline Anesthetic complications: no    Last Vitals:  Filed Vitals:   03/16/15 1315 03/16/15 1330  BP:  91/75  Pulse:  98  Temp: 36.4 C   Resp:  23    Last Pain:  Filed Vitals:   03/16/15 1341  PainSc: 5                  Tiajuana Amass

## 2015-03-16 NOTE — OR Nursing (Addendum)
SATURATION QUALIFICATIONS: (This note is used to comply with regulatory documentation for home oxygen)  Patient Saturations on Room Air at Rest = 88%  Patient Saturations on Room Air while Ambulating = 73%  Patient Saturations on 2 Liters of oxygen while Ambulating = 89%  Please briefly explain why patient needs home oxygen: Pt appears winded after short walk; oxygen level drops to low 70's on room air while walking

## 2015-03-16 NOTE — Brief Op Note (Signed)
03/16/2015  10:28 AM  PATIENT:  Karen Douglas  59 y.o. female  PRE-OPERATIVE DIAGNOSIS:  LEFT MALIGNANT EFFUSION  POST-OPERATIVE DIAGNOSIS:  Left malignant pleural effusion  PROCEDURE:  Procedure(s): INSERTION PLEURAL DRAINAGE CATHETER (Left) Drainage 500 cc L pleural effusion  SURGEON:  Surgeon(s) and Role:    * Ivin Poot, MD - Primary  PHYSICIAN ASSISTANT:   ASSISTANTS: none   ANESTHESIA:   local and IV sedation  EBL:     BLOOD ADMINISTERED:none  DRAINS: Left pleurx catheter  LOCAL MEDICATIONS USED:  LIDOCAINE  and Amount: 10 ml  SPECIMEN:  No Specimen  DISPOSITION OF SPECIMEN:  N/A  COUNTS:  YES  TOURNIQUET:  * No tourniquets in log *  DICTATION: .Dragon Dictation  PLAN OF CARE: Discharge to home after PACU  PATIENT DISPOSITION:  PACU - hemodynamically stable.   Delay start of Pharmacological VTE agent (>24hrs) due to surgical blood loss or risk of bleeding: yes

## 2015-03-16 NOTE — Addendum Note (Signed)
Encounter addended by: Benn Moulder, RN on: 03/16/2015  7:00 PM<BR>     Documentation filed: [facility] Follow-up Actions, Flowsheet VN

## 2015-03-16 NOTE — Progress Notes (Signed)
The patient was examined and preop studies reviewed. There has been no change from the prior exam and the patient is ready for surgery.  Plan placement of L Pleurx catheter on L [place] for malignant effusion

## 2015-03-16 NOTE — Transfer of Care (Signed)
Immediate Anesthesia Transfer of Care Note  Patient: Karen Douglas  Procedure(s) Performed: Procedure(s): INSERTION PLEURAL DRAINAGE CATHETER (Left)  Patient Location: PACU  Anesthesia Type:MAC  Level of Consciousness: awake, alert  and oriented  Airway & Oxygen Therapy: Patient Spontanous Breathing and Patient connected to nasal cannula oxygen  Post-op Assessment: Report given to RN and Post -op Vital signs reviewed and stable  Post vital signs: Reviewed and stable  Last Vitals:  Filed Vitals:   03/16/15 0714 03/16/15 0725  BP: 127/85   Pulse: 111 109  Temp: 36.4 C   Resp: 20     Complications: No apparent anesthesia complications

## 2015-03-17 NOTE — Op Note (Signed)
NAMEYAMILED, PICONE NO.:  [id number]  MEDICAL RECORD NO.:  [id number]  LOCATION:  [facility]                         FACILITY:  [facility]  PHYSICIAN:  Ivin Poot, M.D.  DATE OF BIRTH:  1956/01/14  DATE OF PROCEDURE:  03/16/2015 DATE OF DISCHARGE:  03/16/2015                              OPERATIVE REPORT   OPERATION:  Placement of left PleurX catheter.  PREOPERATIVE DIAGNOSES:  Recurrent malignant left pleural effusion and history of advanced stage breast cancer.  POSTOPERATIVE DIAGNOSES:  Recurrent malignant left pleural effusion and history of advanced stage breast cancer.  ANESTHESIA:  Local 1% lidocaine with IV monitored conscious sedation by Anesthesia.  DESCRIPTION OF PROCEDURE:  The patient was brought to the operating room and placed supine on the operating table, where she was positioned with a slight shoulder roll to elevate the left side of her chest.  A proper time-out was performed, as the patient was prepped and draped as a sterile field.  Intravenous sedation was provided and monitored by Anesthesia.  A 1% lidocaine was infiltrated in the anterior axillary line at the fifth interspace.  A second degree of local infiltration lidocaine was at the midclavicular line at the right costal margin.  Two small incisions were made in each area.  Through the upper incision, a needle-sheath system was inserted into pleural space and pleural fluid was removed.  The needle was removed and through the sheath, a guidewire was passed.  This was confirmed by C-arm fluoroscopy to be in the pleural space in the area of the pleural effusion.  Next, the PleurX catheter was tunneled from the lower incision up to the upper incision. Next over the guidewire, a dilator then the dilator sheath was inserted in the left pleural space.  Through the tear-away sheath, the PleurX catheter after it had been trimmed the appropriate length was inserted in the pleural space and  sheath were removed.  The PleurX was connected to a vacuum bottle and 500 mL of fluid was removed.  The upper incision was closed with 2 subcuticular Vicryl's and a skin closure with 2 interrupted nylon sutures.  The lower incision was closed with a silk suture, which was used to secure the PleurX catheter to the skin.  The incisions were then cleaned and a sterile dressing was applied.  The patient had a chest x-ray in the OR, which showed the PleurX catheter to be in good position with successful drainage of the pleural effusion.  The left lower lung since it had been involved with pleural fluid for some time was entrapped, leaving a subpleural space.     Ivin Poot, M.D.    PV/MEDQ  D:  03/16/2015  T:  03/17/2015  Job:  [id number]

## 2015-03-19 ENCOUNTER — Encounter (HOSPITAL_COMMUNITY): Payer: Self-pay | Admitting: Cardiothoracic Surgery

## 2015-03-19 ENCOUNTER — Ambulatory Visit: Payer: Commercial Managed Care - HMO | Admitting: Radiation Oncology

## 2015-03-19 ENCOUNTER — Ambulatory Visit
Admission: RE | Admit: 2015-03-19 | Discharge: 2015-03-19 | Disposition: A | Discharge status: Home / Self Care | Payer: Commercial Managed Care - HMO | Source: Ambulatory Visit | Attending: Radiation Oncology | Admitting: Radiation Oncology

## 2015-03-19 DIAGNOSIS — Z510 Encounter for antineoplastic radiation therapy: Secondary | ICD-10-CM | POA: Diagnosis not present

## 2015-03-19 DIAGNOSIS — C7951 Secondary malignant neoplasm of bone: Secondary | ICD-10-CM

## 2015-03-19 NOTE — Progress Notes (Signed)
Complex simulation/treatment planning note: The patient was taken to the CT simulator.  A Vac lock immobilization device was constructed to stabilize her left shoulder. She was scanned.  I chose an isocenter along her left glenoid.  The CT data set was sent to the planning system where contoured her CTV/ metastatic disease to bone. She was set up AP and PA with 2 separate and unique MLCs.  I'm prescribing 800 cGy in one session utilizing anterior-posterior fields.  She is now ready for completion of her dosimetry /treatment planning.

## 2015-03-20 ENCOUNTER — Encounter: Payer: Self-pay | Admitting: Pharmacist

## 2015-03-20 DIAGNOSIS — Z5111 Encounter for antineoplastic chemotherapy: Secondary | ICD-10-CM | POA: Insufficient documentation

## 2015-03-20 DIAGNOSIS — J910 Malignant pleural effusion: Secondary | ICD-10-CM | POA: Diagnosis not present

## 2015-03-20 DIAGNOSIS — C7951 Secondary malignant neoplasm of bone: Secondary | ICD-10-CM | POA: Diagnosis not present

## 2015-03-20 NOTE — Progress Notes (Signed)
..  Oral Chemotherapy Follow-Up Form  Original Start date of oral chemotherapy: _11/30/16______   Called patient today to follow up regarding patient's oral chemotherapy medication: _Ibrance___  Pt is doing well today. Her appetite remains poor but she states this has been an ongoing issue and not new. Energy levels are ok (she reports feeling tired at times, again she says this is not new for her). Overall she is tolerating the Ibrance well. She will have lab work and MD follow up visit with Dr. Lindi Adie on Friday 12/16. She will need a new rx sent to [facility] for her next cycle of Ibrance at that time.   Pt states that she started her medication on a Wednesday (03/07/15). She reports that she has 8 capsules remaining and that she has not missed any doses. This correlates with her start date. She will have 1 more week of Ibrance followed by 7 days off. Cycle 2 will be due to start on 04/04/15.   Pt reports __0__ tablets/doses missed in the last week/month.    Pt reports the following side effects: __none_____  Other Issues: _poor appetite________   Will follow up and call patient again in _3  weeks____   Thank you,  Montel Clock, PharmD, [facility]

## 2015-03-21 ENCOUNTER — Encounter: Payer: Self-pay | Admitting: Radiation Oncology

## 2015-03-21 ENCOUNTER — Ambulatory Visit
Admission: RE | Admit: 2015-03-21 | Discharge: 2015-03-21 | Disposition: A | Discharge status: Home / Self Care | Payer: Commercial Managed Care - HMO | Source: Ambulatory Visit | Attending: Radiation Oncology | Admitting: Radiation Oncology

## 2015-03-21 DIAGNOSIS — C7951 Secondary malignant neoplasm of bone: Secondary | ICD-10-CM

## 2015-03-21 DIAGNOSIS — Z510 Encounter for antineoplastic radiation therapy: Secondary | ICD-10-CM | POA: Diagnosis not present

## 2015-03-21 NOTE — Progress Notes (Signed)
Treatment planning note: Karen Douglas completed treatment planning for treatment to her left shoulder.  She is setup to AP and PA fields to the left shoulder.  2 sets unique MLCs along with wedge compensation is utilized for a total of 2 complex treatment devices.  I am prescribing 800 cGy in one session utilizing 6 MV photons.

## 2015-03-21 NOTE — Progress Notes (Signed)
[facility] End of Treatment Note  Name:Karen Douglas  Date: 03/21/2015 [id number] DOB:Nov 21, 1955   Status:outpatient    CC: Maggie Font, MD  Dr. Nicholas Lose  REFERRING PHYSICIAN: Dr. 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   INDICATION FOR TREATMENT: Palliative   TREATMENT DATE:  03/21/2015                         SITE/DOSE:    Left shoulder 800 cGy in one session                        BEAMS/ENERGY:  AP PA fields with 6 MV photons                 NARRATIVE: She tolerated treatment well.                           PLAN: Routine followup in one month with Dr. Sondra Come. Patient instructed to call if questions or worsening complaints in interim.

## 2015-03-21 NOTE — Progress Notes (Signed)
Weekly Management Note:  Site: Left shoulder Current Dose:  800  cGy Projected Dose: 800  cGy  Narrative: The patient is seen today for routine under treatment assessment. CBCT/MVCT images/port films were reviewed. The chart was reviewed.   She is without new complaints today.  She received a single fraction of radiation to her left shoulder without incident.  Physical Examination: There were no vitals filed for this visit..  Weight:  .  No change.  Impression: Radiation therapy well tolerated.  Plan: Follow-up visit in one month with Dr. Sondra Come.

## 2015-03-21 NOTE — Progress Notes (Signed)
Simulation verification note: The patient was set up to her left shoulder with [facility] image guidance.  Her isocenter was in good position and the multileaf collimators contoured the treatment volume to properly.

## 2015-03-23 ENCOUNTER — Ambulatory Visit (HOSPITAL_BASED_OUTPATIENT_CLINIC_OR_DEPARTMENT_OTHER): Payer: Commercial Managed Care - HMO | Admitting: Hematology and Oncology

## 2015-03-23 ENCOUNTER — Ambulatory Visit: Payer: Commercial Managed Care - HMO

## 2015-03-23 ENCOUNTER — Ambulatory Visit (HOSPITAL_BASED_OUTPATIENT_CLINIC_OR_DEPARTMENT_OTHER): Payer: Commercial Managed Care - HMO

## 2015-03-23 ENCOUNTER — Telehealth: Payer: Self-pay | Admitting: Hematology and Oncology

## 2015-03-23 ENCOUNTER — Other Ambulatory Visit (HOSPITAL_BASED_OUTPATIENT_CLINIC_OR_DEPARTMENT_OTHER): Payer: Commercial Managed Care - HMO

## 2015-03-23 VITALS — BP 107/67 | HR 106 | Temp 98.2°F | Resp 18 | Ht 61.0 in | Wt 154.4 lb

## 2015-03-23 DIAGNOSIS — J910 Malignant pleural effusion: Secondary | ICD-10-CM

## 2015-03-23 DIAGNOSIS — C7802 Secondary malignant neoplasm of left lung: Secondary | ICD-10-CM

## 2015-03-23 DIAGNOSIS — Z95828 Presence of other vascular implants and grafts: Secondary | ICD-10-CM

## 2015-03-23 DIAGNOSIS — C771 Secondary and unspecified malignant neoplasm of intrathoracic lymph nodes: Secondary | ICD-10-CM | POA: Diagnosis not present

## 2015-03-23 DIAGNOSIS — C773 Secondary and unspecified malignant neoplasm of axilla and upper limb lymph nodes: Secondary | ICD-10-CM | POA: Diagnosis not present

## 2015-03-23 DIAGNOSIS — C7951 Secondary malignant neoplasm of bone: Secondary | ICD-10-CM | POA: Diagnosis not present

## 2015-03-23 DIAGNOSIS — D6481 Anemia due to antineoplastic chemotherapy: Secondary | ICD-10-CM

## 2015-03-23 DIAGNOSIS — C787 Secondary malignant neoplasm of liver and intrahepatic bile duct: Secondary | ICD-10-CM

## 2015-03-23 DIAGNOSIS — C7801 Secondary malignant neoplasm of right lung: Secondary | ICD-10-CM

## 2015-03-23 DIAGNOSIS — C50511 Malignant neoplasm of 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: Secondary | ICD-10-CM

## 2015-03-23 DIAGNOSIS — Z170 Estrogen receptor positive status [ER+]: Secondary | ICD-10-CM

## 2015-03-23 LAB — COMPREHENSIVE METABOLIC PANEL
ALT: 10 U/L (ref 0–55)
ANION GAP: 11 meq/L (ref 3–11)
AST: 43 U/L — ABNORMAL HIGH (ref 5–34)
Albumin: 2.7 g/dL — ABNORMAL LOW (ref 3.5–5.0)
Alkaline Phosphatase: 101 U/L (ref 40–150)
BILIRUBIN TOTAL: 0.51 mg/dL (ref 0.20–1.20)
BUN: 11.2 mg/dL (ref 7.0–26.0)
CALCIUM: 9.2 mg/dL (ref 8.4–10.4)
CO2: 26 meq/L (ref 22–29)
CREATININE: 0.9 mg/dL (ref 0.6–1.1)
Chloride: 99 mEq/L (ref 98–109)
EGFR: 77 mL/min/{1.73_m2} — ABNORMAL LOW (ref >90)
Glucose: 96 mg/dl (ref 70–140)
Potassium: 3.3 mEq/L — ABNORMAL LOW (ref 3.5–5.1)
Sodium: 136 mEq/L (ref 136–145)
TOTAL PROTEIN: 6.9 g/dL (ref 6.4–8.3)

## 2015-03-23 LAB — CBC WITH DIFFERENTIAL/PLATELET
BASO%: 0.4 % (ref 0.0–2.0)
Basophils Absolute: 0 10*3/uL (ref 0.0–0.1)
EOS%: 1.7 % (ref 0.0–7.0)
Eosinophils Absolute: 0 10*3/uL (ref 0.0–0.5)
HEMATOCRIT: 31.7 % — AB (ref 34.8–46.6)
HGB: 10 g/dL — ABNORMAL LOW (ref 11.6–15.9)
LYMPH%: 20.6 % (ref 14.0–49.7)
MCH: 25.7 pg (ref 25.1–34.0)
MCHC: 31.4 g/dL — ABNORMAL LOW (ref 31.5–36.0)
MCV: 81.9 fL (ref 79.5–101.0)
MONO#: 0.1 10*3/uL (ref 0.1–0.9)
MONO%: 6.5 % (ref 0.0–14.0)
NEUT%: 70.8 % (ref 38.4–76.8)
NEUTROS ABS: 1.4 10*3/uL — AB (ref 1.5–6.5)
RBC: 3.88 10*6/uL (ref 3.70–5.45)
RDW: 24.5 % — ABNORMAL HIGH (ref 11.2–14.5)
WBC: 2 10*3/uL — ABNORMAL LOW (ref 3.9–10.3)
lymph#: 0.4 10*3/uL — ABNORMAL LOW (ref 0.9–3.3)

## 2015-03-23 MED ORDER — SODIUM CHLORIDE 0.9 % IJ SOLN
10.0000 mL | INTRAMUSCULAR | Status: DC | PRN
  Administered 2015-03-23: 10 mL via INTRAVENOUS
  Filled 2015-03-23: qty 10

## 2015-03-23 MED ORDER — PALBOCICLIB 125 MG PO CAPS: 125.0000 mg | ORAL_CAPSULE | Freq: Every day | ORAL | Status: AC

## 2015-03-23 MED ORDER — SODIUM CHLORIDE 0.9 % IJ SOLN
10.0000 mL | INTRAMUSCULAR | Status: DC | PRN
  Administered 2015-03-23: 10 mL
  Filled 2015-03-23: qty 10

## 2015-03-23 MED ORDER — ZOLEDRONIC ACID 4 MG/100ML IV SOLN
4.0000 mg | Freq: Once | INTRAVENOUS | Status: AC
  Administered 2015-03-23: 4 mg via INTRAVENOUS
  Filled 2015-03-23: qty 100

## 2015-03-23 MED ORDER — SODIUM CHLORIDE 0.9 % IV SOLN
Freq: Once | INTRAVENOUS | Status: AC
  Administered 2015-03-23: 11:00:00 via INTRAVENOUS

## 2015-03-23 MED ORDER — HEPARIN SOD (PORK) LOCK FLUSH 100 UNIT/ML IV SOLN
500.0000 [IU] | Freq: Once | INTRAVENOUS | Status: AC | PRN
  Administered 2015-03-23: 500 [IU]
  Filled 2015-03-23: qty 5

## 2015-03-23 NOTE — Progress Notes (Signed)
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 [facility])   Staging form: Breast, AJCC 7th Edition     Clinical: Stage IV (T4b, N1, M1) - Signed by Rulon Eisenmenger, MD on 12/30/2013     Pathologic: No stage assigned - Unsigned   SUMMARY OF ONCOLOGIC HISTORY:   Breast cancer of 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 Diffuse hypermetabolic right breast inflammatory carcinoma.  Metastatic lymphadenopathy in the right axilla, mediastinum, and bilateral hilar regions, small bilateral pulmonary metastases, possible 9th rib metastases   12/30/2013 - 04/14/2014 Chemotherapy Taxotere and cytoxan given on day 1 of a 21 day cycle with neulasta given on day 2 for granulocyte support.  A total of 6 cycles are planned with CT chest and mammo/ultrasound planned after cycle 3.    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 Valere Dross to chest wall and axilla   08/23/2014 -  Anti-estrogen oral therapy Anastrozole 1 mg daily   12/14/2014 Imaging MRI right shoulder: Bone metastases involving the glenoid and coracoid periostitis and possible extraosseous ext of tumor causing soft tissue edema, ? nondisplaced pathologic coracoid base fracture, lesions in proximal humerus, ext left axillary lymphaden   12/14/2014 Relapse/Recurrence Left Axill LN Biopsy: Metastatic cancer Er 20%, PR 0%, Her 2 Neg   12/18/2014 PET scan Widespread progression, Left Axill LN, Sub pectoralis LN, Bil Hilar LN,  RUL Lung nodule, RML nodule, RUL nodule, Rt Effusion, Musculature Rt chest wall, Bone mets, Liver mets   12/22/2014 - 02/09/2015 Chemotherapy Palliative chemotherapy with Halaven day 1 day 8 every 3 weeks 3 cycles, CT scans 02/19/2015 show stable disease without any response in the liver   02/19/2015 Imaging Ct CAP: Stable left axillary and sub pect LN mets, stable medial Rt chest wall mets, Dec Pleural eff, Inc Left Eff, Dec Lung nodules, No sign change in liver mets (difficult to assess), bone mets not well seen   03/05/2015 -  Anti-estrogen oral therapy Ibrance 3 weeks on 1 week off plus letrozole daily   03/21/2015 - 03/21/2015 Radiation Therapy Left shoulder 800 cGy in one session    CHIEF  COMPLIANT: Follow-up after left shoulder radiation and left chest Pleurx catheter placement  INTERVAL HISTORY: Karen Douglas is a 59 year old with above-mentioned history of metastatic breast cancer with bone, lung metastases along with liver metastases. She is here after having undergone Pleurx  catheter placement. She is doing much better. Home health is draining her. Her breathing is still slightly labored but overall much improved. She is still very frail and uses a wheelchair for ambulation. Her shoulder pain is still there although maybe slightly better.  REVIEW OF SYSTEMS:   Constitutional: Denies fevers, chills or abnormal weight loss Eyes: Denies blurriness of vision Ears, nose, mouth, throat, and face: Denies mucositis or sore throat Respiratory: Shortness of breath at rest Cardiovascular: Denies palpitation, chest discomfort or lower extremity swelling Gastrointestinal:  Denies nausea, heartburn or change in bowel habits Skin: Denies abnormal skin rashes Lymphatics: Denies new lymphadenopathy or easy bruising Neurological: Neuropathy, left shoulder pain Behavioral/Psych: Mood is stable, no new changes   All other systems were reviewed with the patient and are negative.  I have reviewed the past medical history, past surgical history, social history and family history with the patient and they are unchanged from previous note.  ALLERGIES:  has No Known Allergies.  MEDICATIONS:  Current Outpatient Prescriptions  Medication Sig Dispense Refill  . atorvastatin (LIPITOR) 20 MG tablet Take 20 mg by mouth daily.    . calcium carbonate (OS-CAL) 600 MG TABS tablet Take 600 mg by mouth daily with breakfast.    . letrozole (FEMARA) 2.5 MG tablet Take 1 tablet (2.5 mg total) by mouth daily. 90 tablet 3  . levofloxacin (LEVAQUIN) 750 MG tablet Take 1 tablet (750 mg total) by mouth daily. 10 tablet 0  . lidocaine-prilocaine (EMLA) cream Apply to affected area once 30 g 3  . oxyCODONE-acetaminophen (PERCOCET/ROXICET) 5-325 MG tablet Take 1 tablet by mouth every 8 (eight) hours as needed for severe pain. for pain 60 tablet 0  . palbociclib (IBRANCE) 125 MG capsule Take 1 capsule (125 mg total) by mouth daily with breakfast. Take whole with food. 21 capsule 0  . polyethylene glycol  (MIRALAX / GLYCOLAX) packet Take 17 g by mouth daily as needed for mild constipation.    . potassium chloride SA (K-DUR,KLOR-CON) 20 MEQ tablet Take 1 tablet (20 mEq total) by mouth daily. (Patient not taking: Reported on 03/14/2015) 10 tablet 0   No current facility-administered medications for this visit.   Facility-Administered Medications Ordered in Other Visits  Medication Dose Route Frequency Provider Last Rate Last Dose  . sodium chloride 0.9 % injection 10 mL  10 mL Intravenous PRN Nicholas Lose, MD   10 mL at 02/13/15 1359    PHYSICAL EXAMINATION: ECOG PERFORMANCE STATUS: 3 - Symptomatic, >50% confined to bed  Filed Vitals:   03/23/15 0959  BP: 107/67  Pulse: 106  Temp: 98.2 F (36.8 C)  Resp: 18   Filed Weights   03/23/15 0959  Weight: 154 lb 6.4 oz (70.035 kg)    GENERAL:alert, no distress and comfortable SKIN: skin color, texture, turgor are normal, no rashes or significant lesions EYES: normal, Conjunctiva are pink and non-injected, sclera clear OROPHARYNX:no exudate, no erythema and lips, buccal mucosa, and tongue normal  NECK: supple, thyroid normal size, non-tender, without nodularity LYMPH:  no palpable lymphadenopathy in the cervical, axillary or inguinal LUNGS: Diminished breath sounds at the lung bases HEART: regular rate & rhythm and no murmurs and no lower extremity edema ABDOMEN:abdomen soft, non-tender and normal bowel sounds  Musculoskeletal:no cyanosis of digits and no clubbing  NEURO: alert & oriented x 3 with fluent speech, no focal motor/sensory deficits  LABORATORY DATA:  I have reviewed the data as listed   Chemistry      Component Value Date/Time   NA 140 03/13/2015 0829   NA 140 05/17/2014 0544   K 3.9 03/13/2015 0829   K 4.1 05/17/2014 0544   CL 111 05/17/2014 0544   CO2 21* 03/13/2015 0829   CO2 24 05/17/2014 0544   BUN 10.3 03/13/2015 0829   BUN 13 05/17/2014 0544   CREATININE 1.0 03/13/2015 0829   CREATININE 0.81 05/17/2014 0544        Component Value Date/Time   CALCIUM 9.3 03/13/2015 0829   CALCIUM 8.3* 05/17/2014 0544   ALKPHOS 99 03/13/2015 0829   AST 43* 03/13/2015 0829   ALT 12 03/13/2015 0829   BILITOT 0.58 03/13/2015 0829       Lab Results  Component Value Date   WBC 2.0* 03/23/2015   HGB 10.0* 03/23/2015   HCT 31.7* 03/23/2015   MCV 81.9 03/23/2015   PLT 129 Few Large & giant platelets* 03/23/2015   NEUTROABS 1.4* 03/23/2015   ASSESSMENT & PLAN:  Breast cancer of 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 ([place]) 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, 3/4 lymph nodes positive, lymphovascular invasion present, ER 99%, PR 51%, HER-2 negative, Ki-67 95% T3 N1 stage IIIa, completed radiation therapy 08/15/2014, started anastrozole 08/23/2014 stopped 12/18/2014 for progression  PET CT 12/18/14: Widespread progression, Left Axill LN, Sub pectoralis LN, Bil Hilar LN, RUL Lung nodule, RML nodule, RUL nodule, Rt Effusion, Musculature Rt chest wall, Bone mets, Liver mets  CT chest abdomen pelvis 02/19/2015: Stable left axillary and sub pect LN mets, stable medial Rt chest wall mets, Dec Pleural eff, Inc Left Eff, Dec Lung nodules, No sign change in liver mets (difficult to assess), bone mets not well seen  Prior Treatment: Palliative systemic chemotherapy with Halaven 12/22/2014 D1 day 8 every 3 weeks 3 cycles with not much significant reduction in the liver lesions. Hence we decided to stop Halaven.  Current treatment: Ibrance with letrozole started 03/05/2015  Pleural effusion: Status post thoracentesis on 02/13/2015 and 03/06/15  /positive for breast cancer. Pleurx catheter was placed on 03/16/2015 (500 ml removed).  Bone Mets: Zometa with calcium and vitamin D.  Anemia related to prior chemotherapy: Hemoglobin is 10 today.  Ibrance toxicities: Lab work was reviewed. 1. Grade 1 neutropenia ANC 1.4. Recommended continuation of the same dosage of Ibrance for cycle 2. 2. occasional itching of the skin   Plan to give her 3 months of this treatment followed by CT scans.   Severe left shoulder pain: Related to bone metastases. received palliative radiation to the shoulder 03/21/2015 800 cGy in 1 fraction.  CODE STATUS: I discussed the patient and her family about CODE STATUS. I encouraged them to think about [facility] but the family decided to keep her as full code. I also recommend palliative care to see her. Sent the referral.  Return to clinic 4 weeks for cycle 2 of Ibrance along with Zometa  No orders of the defined types were placed  in this encounter.   The patient has a good understanding of the overall plan. she agrees with it. she will call with any problems that may develop before the next visit here.   Rulon Eisenmenger, MD 03/23/2015

## 2015-03-23 NOTE — Addendum Note (Signed)
Addended by: Prentiss Bells on: 03/23/2015 11:25 AM   Modules accepted: Orders, Medications

## 2015-03-23 NOTE — Assessment & Plan Note (Signed)
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, 3/4 lymph nodes positive, lymphovascular invasion present, ER 99%, PR 51%, HER-2 negative, Ki-67 95% T3 N1 stage IIIa, completed radiation therapy 08/15/2014, started anastrozole 08/23/2014 stopped 12/18/2014 for progression  PET CT 12/18/14: Widespread progression, Left Axill LN, Sub pectoralis LN, Bil Hilar LN, RUL Lung nodule, RML nodule, RUL nodule, Rt Effusion, Musculature Rt chest wall, Bone mets, Liver mets  CT chest abdomen pelvis 02/19/2015: Stable left axillary and sub pect LN mets, stable medial Rt chest wall mets, Dec Pleural eff, Inc Left Eff, Dec Lung nodules, No sign change in liver mets (difficult to assess), bone mets not well seen  Prior Treatment: Palliative systemic chemotherapy with Halaven 12/22/2014 D1 day 8 every 3 weeks 3 cycles with not much significant reduction in the liver lesions. Hence we decided to stop Halaven.  Current treatment: Ibrance with letrozole started 03/05/2015  Pleural effusion: Status post thoracentesis on 02/13/2015 and 03/06/15 /positive for breast cancer. Pleurx catheter was placed on 03/16/2015 (500 ml removed).  Bone Mets: Zometa with calcium and vitamin D.  Anemia related to prior chemotherapy: Hemoglobin is 10 today.  Ibrance toxicities: occasional itching of the skin otherwise no side effects so far Lab work was reviewed. Plan to give her 3 months of this treatment followed by CT scans. Return to clinic 3 weeks for cycle 3 of Ibrance  Severe left shoulder pain: Related to bone metastases. received palliative radiation to the  shoulder 03/21/2015 800 cGy in 1 fraction.  CODE STATUS: I discussed the patient and her family about CODE STATUS. I encouraged them to think about [facility] but the family decided to keep her as full code. I will also recommend palliative care to see her. I will discuss this with her the next appointment and then make the referral.

## 2015-03-23 NOTE — Telephone Encounter (Signed)
Appointments made and avs printed for patient °

## 2015-03-26 ENCOUNTER — Ambulatory Visit (HOSPITAL_BASED_OUTPATIENT_CLINIC_OR_DEPARTMENT_OTHER): Payer: Commercial Managed Care - HMO

## 2015-03-26 ENCOUNTER — Other Ambulatory Visit: Payer: Self-pay | Admitting: *Deleted

## 2015-03-26 ENCOUNTER — Telehealth: Payer: Self-pay | Admitting: *Deleted

## 2015-03-26 ENCOUNTER — Ambulatory Visit (HOSPITAL_BASED_OUTPATIENT_CLINIC_OR_DEPARTMENT_OTHER): Payer: Commercial Managed Care - HMO | Admitting: Nurse Practitioner

## 2015-03-26 ENCOUNTER — Encounter: Payer: Self-pay | Admitting: Nurse Practitioner

## 2015-03-26 VITALS — BP 115/77 | HR 103 | Temp 98.8°F | Resp 18

## 2015-03-26 VITALS — BP 103/72 | HR 103 | Temp 98.7°F | Resp 16 | Wt 155.4 lb

## 2015-03-26 DIAGNOSIS — R112 Nausea with vomiting, unspecified: Secondary | ICD-10-CM | POA: Diagnosis not present

## 2015-03-26 DIAGNOSIS — C7951 Secondary malignant neoplasm of bone: Secondary | ICD-10-CM

## 2015-03-26 DIAGNOSIS — G4709 Other insomnia: Secondary | ICD-10-CM

## 2015-03-26 DIAGNOSIS — E860 Dehydration: Secondary | ICD-10-CM

## 2015-03-26 DIAGNOSIS — C50511 Malignant neoplasm of 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: Secondary | ICD-10-CM

## 2015-03-26 DIAGNOSIS — C787 Secondary malignant neoplasm of liver and intrahepatic bile duct: Secondary | ICD-10-CM

## 2015-03-26 DIAGNOSIS — J90 Pleural effusion, not elsewhere classified: Secondary | ICD-10-CM

## 2015-03-26 DIAGNOSIS — Z95828 Presence of other vascular implants and grafts: Secondary | ICD-10-CM

## 2015-03-26 DIAGNOSIS — F419 Anxiety disorder, unspecified: Secondary | ICD-10-CM | POA: Insufficient documentation

## 2015-03-26 MED ORDER — LORAZEPAM 0.5 MG PO TABS: 0.5000 mg | ORAL_TABLET | Freq: Three times a day (TID) | ORAL | Status: AC | PRN

## 2015-03-26 MED ORDER — ONDANSETRON HCL 40 MG/20ML IJ SOLN
Freq: Once | INTRAMUSCULAR | Status: AC
  Administered 2015-03-26: 14:00:00 via INTRAVENOUS
  Filled 2015-03-26: qty 4

## 2015-03-26 MED ORDER — HEPARIN SOD (PORK) LOCK FLUSH 100 UNIT/ML IV SOLN
500.0000 [IU] | Freq: Once | INTRAVENOUS | Status: AC | PRN
  Administered 2015-03-26: 500 [IU]
  Filled 2015-03-26: qty 5

## 2015-03-26 MED ORDER — SODIUM CHLORIDE 0.9 % IJ SOLN
10.0000 mL | INTRAMUSCULAR | Status: DC | PRN
  Administered 2015-03-26: 10 mL via INTRAVENOUS
  Filled 2015-03-26: qty 10

## 2015-03-26 MED ORDER — SODIUM CHLORIDE 0.9 % IJ SOLN
10.0000 mL | INTRAMUSCULAR | Status: DC | PRN
  Administered 2015-03-26: 10 mL
  Filled 2015-03-26: qty 10

## 2015-03-26 MED ORDER — SODIUM CHLORIDE 0.9 % IV SOLN
INTRAVENOUS | Status: AC
  Administered 2015-03-26: 12:00:00 via INTRAVENOUS

## 2015-03-26 NOTE — Assessment & Plan Note (Signed)
Patient reports chronic nausea; and intermittent vomiting.  She does have anti-nausea medications already home to take as directed.  She states that the chronic nausea is causing her to feel more dehydrated; and is requesting IV fluid rehydration today.  She will receive IV fluid rehydration while the cancer Center; and will also receive Zofran IV.

## 2015-03-26 NOTE — Progress Notes (Signed)
Patient received IV zofran with IVF due to vomiting in infusion. She reports relief prior to discharge. RN reviewed home meds. She had not picked up zofran or compazine from pharmacy. Encouraged patient to do so and educated her to wait 8 hours before next zofran dose. New med ativan today per Selena Lesser, NP. Patient has prescription. Side effects reviewed with verbal understanding given.

## 2015-03-26 NOTE — Telephone Encounter (Signed)
VMM that patient wanted a return call. Called patient and she stated that she thought that she might need fluids. States that she can't sleep or eat, denies any worsening of breathing. Sounded tearful on the phone. Discussed with Dr. Lindi Adie, patient to see Selena Lesser. Called patient and advised to come at 10:00, she verbalized understanding. pof sent to scheduler.

## 2015-03-26 NOTE — Progress Notes (Signed)
SYMPTOM MANAGEMENT CLINIC   HPI: Karen Douglas 59 y.o. female diagnosed with breast cancer; with both liver and bone metastasis.  Currently undergoing Letrozole and ibrance oral therapy; as well as Zometa infusions.  Patient presents to the [facility] today with complain of some chronic nausea and intermittent vomiting.  She denies any diarrhea.  She feels dehydrated today; his requested IV fluid rehydration.  Also, patient is experiencing increased and Friday which is causing both insomnia and frequent tearfulness.  Patient denies any recent fevers, chills.   HPI  ROS  Past Medical History  Diagnosis Date  . Headache   . Cancer ([place]) 11/28/13 bx    breast  . Breast cancer ([place]) 11/28/13    right  invasive mammary ca, metastatic, mammary ca in situ  . 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 Status post chemotherapy     Completed 6 cycles of Taxotere Cytoxan.  . S/P radiation therapy 07/04/2014 through 08/23/2014    Right chest wall and regional lymph nodes 5040 cGy in 28 sessions. Right chest wall/ mastectomy scar boost 1000 cGy 5 sessions, recurrent right chest wall nodule boost 600 cGy in 3 sessions.  . 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 Shortness of breath dyspnea     due to pleural effusion  . Constipation     Past Surgical History  Procedure Laterality Date  . Finger surgery    . Portacath placement N/A 12/19/2013    Procedure: INSERTION PORT-A-CATH;  Surgeon: Autumn Messing III, MD;  Location: [facility];  Service: General;  Laterality: N/A;  . Mastectomy modified radical Right 05/15/2014    Procedure: RIGHT MASTECTOMY MODIFIED RADICAL;  Surgeon: Autumn Messing III, MD;  Location: [facility];  Service: General;  Laterality: Right;  . Abdominal hysterectomy  1988    cysts on ovaries  . Chest tube insertion Left 03/16/2015    Procedure: INSERTION PLEURAL DRAINAGE CATHETER;  Surgeon: Ivin Poot, MD;  Location: [facility] OR;  Service: Thoracic;  Laterality: Left;     has Breast cancer of 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 ([place]); Arm edema; Shoulder pain; Lymphedema of upper extremity; Bone metastases ([place]); Liver metastases ([place]); Pleural effusion, left; Hypokalemia; Hypoalbuminemia due to protein-calorie malnutrition ([place]); Malignant pleural effusion; Anemia due to antineoplastic chemotherapy; Encounter for chemotherapy management; Nausea with vomiting; Dehydration; and Anxiety on her problem list.    has No Known Allergies.    Medication List       This list is accurate as of: 03/26/15  5:57 PM.  Always use your most recent med list.               atorvastatin 20 MG tablet  Commonly known as:  LIPITOR  Take 20 mg by mouth daily.     calcium carbonate 600 MG Tabs tablet  Commonly known as:  OS-CAL  Take 600 mg by mouth daily with breakfast.     letrozole 2.5 MG tablet  Commonly known as:  FEMARA  Take 1 tablet (2.5 mg total) by mouth daily.     levofloxacin 750 MG tablet  Commonly known as:  LEVAQUIN  Take 1 tablet (750 mg total) by mouth daily.     lidocaine-prilocaine cream  Commonly known as:  EMLA  Apply to affected area once     LORazepam 0.5 MG tablet  Commonly known as:  ATIVAN  Take 1 tablet (0.5 mg total) by mouth every 8 (  eight) hours as needed for anxiety.     ondansetron 8 MG tablet  Commonly known as:  ZOFRAN  TAKE 1 TABLET BY MOUTH TWICE A DAY (START THE DAY AFTER CHEMO FOR 2 DAY THEN AS NEEDED)     oxyCODONE-acetaminophen 5-325 MG tablet  Commonly known as:  PERCOCET/ROXICET  Take 1 tablet by mouth every 8 (eight) hours as needed for severe pain. for pain     palbociclib 125 MG capsule  Commonly known as:  IBRANCE  Take 1 capsule (125 mg total) by mouth daily with breakfast. whole with food for 21 days, then take 7 days off.  Cycle 2 to start 12/27     polyethylene glycol packet  Commonly known as:  MIRALAX / GLYCOLAX  Take 17 g by mouth daily as needed for mild constipation.     potassium  chloride SA 20 MEQ tablet  Commonly known as:  K-DUR,KLOR-CON  Take 1 tablet (20 mEq total) by mouth daily.     prochlorperazine 10 MG tablet  Commonly known as:  COMPAZINE  TAKE 1 TABLET BY MOUTH EVERY 6 (SIX) HOURS AS NEEDED (NAUSEA OR VOMITING).         PHYSICAL EXAMINATION  Oncology Vitals 03/26/2015 03/26/2015  Height - -  Weight - 70.489 kg  Weight (lbs) - 155 lbs 6 oz  BMI (kg/m2) - -  Temp 98.8 98.7  Pulse 103 103  Resp 18 16  SpO2 98 100  BSA (m2) - -   BP Readings from Last 2 Encounters:  03/26/15 115/77  03/26/15 103/72    Physical Exam  Constitutional: She is oriented to person, place, and time. Vital signs are normal. She appears dehydrated. She appears unhealthy.  HENT:  Head: Normocephalic and atraumatic.  Mouth/Throat: Oropharynx is clear and moist.  Eyes: Conjunctivae and EOM are normal. Pupils are equal, round, and reactive to light. Right eye exhibits no discharge. Left eye exhibits no discharge. No scleral icterus.  Neck: Normal range of motion. Neck supple. No JVD present. No tracheal deviation present. No thyromegaly present.  Cardiovascular: Normal rate, regular rhythm, normal heart sounds and intact distal pulses.   Pulmonary/Chest: No respiratory distress. She has no wheezes. She has no rales. She exhibits no tenderness.  Patient does appear mildly short of breath; but no rhonchi or wheezes noted.  Also, no coughing noted.  Left Pleurx catheter dressing intact.  Abdominal: Soft. Bowel sounds are normal. She exhibits no distension and no mass. There is no tenderness. There is no rebound and no guarding.  Musculoskeletal: Normal range of motion. She exhibits no edema or tenderness.  Lymphadenopathy:    She has no cervical adenopathy.  Neurological: She is alert and oriented to person, place, and time.  Skin: Skin is warm and dry. No rash noted. No erythema. No pallor.  Psychiatric:  Anxious and tearful.  Nursing note and vitals  reviewed.   LABORATORY DATA:. No visits with results within 3 Day(s) from this visit. Latest known visit with results is:  Appointment on 03/23/2015  Component Date Value Ref Range Status  . WBC 03/23/2015 2.0* 3.9 - 10.3 10e3/uL Final  . NEUT# 03/23/2015 1.4* 1.5 - 6.5 10e3/uL Final  . HGB 03/23/2015 10.0* 11.6 - 15.9 g/dL Final  . HCT 03/23/2015 31.7* 34.8 - 46.6 % Final  . Platelets 03/23/2015 129 Few Large & giant platelets* 145 - 400 10e3/uL Final  . MCV 03/23/2015 81.9  79.5 - 101.0 fL Final  . MCH 03/23/2015 25.7  25.1 -  34.0 pg Final  . MCHC 03/23/2015 31.4* 31.5 - 36.0 g/dL Final  . RBC 03/23/2015 3.88  3.70 - 5.45 10e6/uL Final  . RDW 03/23/2015 24.5* 11.2 - 14.5 % Final  . lymph# 03/23/2015 0.4* 0.9 - 3.3 10e3/uL Final  . MONO# 03/23/2015 0.1  0.1 - 0.9 10e3/uL Final  . Eosinophils Absolute 03/23/2015 0.0  0.0 - 0.5 10e3/uL Final  . Basophils Absolute 03/23/2015 0.0  0.0 - 0.1 10e3/uL Final  . NEUT% 03/23/2015 70.8  38.4 - 76.8 % Final  . LYMPH% 03/23/2015 20.6  14.0 - 49.7 % Final  . MONO% 03/23/2015 6.5  0.0 - 14.0 % Final  . EOS% 03/23/2015 1.7  0.0 - 7.0 % Final  . BASO% 03/23/2015 0.4  0.0 - 2.0 % Final  . Sodium 03/23/2015 136  136 - 145 mEq/L Final  . Potassium 03/23/2015 3.3* 3.5 - 5.1 mEq/L Final  . Chloride 03/23/2015 99  98 - 109 mEq/L Final  . CO2 03/23/2015 26  22 - 29 mEq/L Final  . Glucose 03/23/2015 96  70 - 140 mg/dl Final   Glucose reference range is for nonfasting patients. Fasting glucose reference range is 70- 100.  Marland Kitchen BUN 03/23/2015 11.2  7.0 - 26.0 mg/dL Final  . Creatinine 03/23/2015 0.9  0.6 - 1.1 mg/dL Final  . Total Bilirubin 03/23/2015 0.51  0.20 - 1.20 mg/dL Final  . Alkaline Phosphatase 03/23/2015 101  40 - 150 U/L Final  . AST 03/23/2015 43* 5 - 34 U/L Final  . ALT 03/23/2015 10  0 - 55 U/L Final  . Total Protein 03/23/2015 6.9  6.4 - 8.3 g/dL Final  . Albumin 03/23/2015 2.7* 3.5 - 5.0 g/dL Final  . Calcium 03/23/2015 9.2  8.4 - 10.4  mg/dL Final  . Anion Gap 03/23/2015 11  3 - 11 mEq/L Final  . EGFR 03/23/2015 77* >90 ml/min/1.73 m2 Final   eGFR is calculated using the CKD-EPI Creatinine Equation (2009)     RADIOGRAPHIC STUDIES: No results found.  ASSESSMENT/PLAN:    Pleural effusion, left Patient has a chronic left pleural effusion; and has her Pleurx catheter drained per her home health nurse on an every other day basis.  Patient states that she is constantly feeling short of breath; but that the dyspnea is currently stable and at her baseline.  She remains on home oxygen via nasal canulla at 2 liters.  She is expecting the home health nurse at her home later this afternoon for drainage of Pleurx catheter.  Nausea with vomiting Patient reports chronic nausea; and intermittent vomiting.  She does have anti-nausea medications already home to take as directed.  She states that the chronic nausea is causing her to feel more dehydrated; and is requesting IV fluid rehydration today.  She will receive IV fluid rehydration while the cancer Center; and will also receive Zofran IV.  Dehydration Patient reports chronic nausea; and intermittent vomiting.  She does have anti-nausea medications already home to take as directed.  She states that the chronic nausea is causing her to feel more dehydrated; and is requesting IV fluid rehydration today.  She will receive IV fluid rehydration while the cancer Center; and will also receive Zofran IV.  Breast cancer of 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 [facility]) Patient continues to take Ibrance and letrozole oral therapy.  She received her last Zometa infusion on the super 16th 2016.  After a long discussion with Dr. Lindi Adie last week-patient has been ordered a palliative care referral.  Confirmed that the palliative care nurse practitioner will be in contact with the patient later today for an initial evaluation.  Once again-confirm with both patient and her husband that patient does  want to continue as a FULL CODE.   Patient is scheduled for a restaging whole-body scan on 05-06-15.  Patient is scheduled for labs, flush, visit, and her next zometa infusion on 04/19/2015.  Anxiety Patient has a history of chronic anxiety; but feels her anxiety is becoming progressively worse over the last week or so.  She states that she is chronically tearful; and anxious about her health and her future.  She denies feeling depressed; but is requesting options regarding her anxiety at this time.  She denies any suicidal or homicidal ideation.  After long discussion with both patient and her husband-advised patient that there are [place] worker's that can help them with counseling and coping skills.  Also, reviewed the options of both antidepressives and anti--anxiety agents.  Since patient is complaining of anxiety, insomnia, and chronic nausea.-Will first try Ativan 0.5 mg on an as-needed basis to see if this helps.  Advised both patient and her husband that the Ativan is considered narcotic and may make her slightly sleepy.  Patient was advised not to drink or drive while taking the Ativan.  Also, patient has plans to meet with the palliative care nurse practitioner later today to discuss further options.  Patient stated understanding of all instructions; and was in agreement with this plan of care. The patient knows to call the clinic with any problems, questions or concerns.   Review/collaboration with Dr. Lindi Adie regarding all aspects of patient's visit today.   Total time spent with patient was 25 minutes;  with greater than 75 percent of that time spent in face to face counseling regarding patient's symptoms,  and coordination of care and follow up.  Disclaimer:This dictation was prepared with Dragon/digital dictation along with [employer]. Any transcriptional errors that result from this process are unintentional.  Drue Second, NP 03/26/2015

## 2015-03-26 NOTE — Assessment & Plan Note (Signed)
Patient continues to take Ibrance and letrozole oral therapy.  She received her last Zometa infusion on the super 16th 2016.  After a long discussion with Dr. Lindi Adie last week-patient has been ordered a palliative care referral.  Confirmed that the palliative care nurse practitioner will be in contact with the patient later today for an initial evaluation.  Once again-confirm with both patient and her husband that patient does want to continue as a FULL CODE.   Patient is scheduled for a restaging whole-body scan on 04-27-2015.  Patient is scheduled for labs, flush, visit, and her next zometa infusion on 04/19/2015.

## 2015-03-26 NOTE — Assessment & Plan Note (Signed)
Patient has a history of chronic anxiety; but feels her anxiety is becoming progressively worse over the last week or so.  She states that she is chronically tearful; and anxious about her health and her future.  She denies feeling depressed; but is requesting options regarding her anxiety at this time.  She denies any suicidal or homicidal ideation.  After long discussion with both patient and her husband-advised patient that there are [place] worker's that can help them with counseling and coping skills.  Also, reviewed the options of both antidepressives and anti--anxiety agents.  Since patient is complaining of anxiety, insomnia, and chronic nausea.-Will first try Ativan 0.5 mg on an as-needed basis to see if this helps.  Advised both patient and her husband that the Ativan is considered narcotic and may make her slightly sleepy.  Patient was advised not to drink or drive while taking the Ativan.  Also, patient has plans to meet with the palliative care nurse practitioner later today to discuss further options.

## 2015-03-26 NOTE — Patient Instructions (Signed)

## 2015-03-26 NOTE — Assessment & Plan Note (Signed)
Patient has a chronic left pleural effusion; and has her Pleurx catheter drained per her home health nurse on an every other day basis.  Patient states that she is constantly feeling short of breath; but that the dyspnea is currently stable and at her baseline.  She remains on home oxygen via nasal canulla at 2 liters.  She is expecting the home health nurse at her home later this afternoon for drainage of Pleurx catheter.

## 2015-03-26 NOTE — Telephone Encounter (Signed)
Called to f/u with referral for palliative care. Received call from [place] at [facility] and [place] that patient will be seen today.

## 2015-03-28 ENCOUNTER — Telehealth: Payer: Self-pay | Admitting: *Deleted

## 2015-03-28 NOTE — Telephone Encounter (Signed)
Left VMM for patient to call clinic. Trying to set up appt for Friday with Dr. Lindi Adie.

## 2015-03-29 ENCOUNTER — Telehealth: Payer: Self-pay | Admitting: *Deleted

## 2015-03-29 ENCOUNTER — Telehealth: Payer: Self-pay | Admitting: Hematology and Oncology

## 2015-03-29 NOTE — Telephone Encounter (Signed)
TC to pt to check status following [facility] visit on 12/19 Pt reports n/v have resolved. She states the Ativan is helping. She sometimes gets anxiety so bad she has a difficult time breathing. Discussed some deep breathing exercises with her. Advised pt Dr. Geralyn Flash nurse or scheduling would be calling her to make an appt for Friday. Pt reports home care nurse is coming to day at 2pm

## 2015-03-29 NOTE — Telephone Encounter (Signed)
Spoke with Beverlee Nims and patients daughter and yes she needs to come in tomorrow daughter aware of her appointment 12/23   anne

## 2015-03-30 ENCOUNTER — Ambulatory Visit (HOSPITAL_BASED_OUTPATIENT_CLINIC_OR_DEPARTMENT_OTHER): Payer: Commercial Managed Care - HMO | Admitting: Hematology and Oncology

## 2015-03-30 VITALS — BP 131/77 | HR 116 | Temp 97.9°F | Resp 16

## 2015-03-30 DIAGNOSIS — C50511 Malignant neoplasm of 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: Secondary | ICD-10-CM | POA: Diagnosis not present

## 2015-03-30 DIAGNOSIS — C787 Secondary malignant neoplasm of liver and intrahepatic bile duct: Secondary | ICD-10-CM

## 2015-03-30 DIAGNOSIS — D701 Agranulocytosis secondary to cancer chemotherapy: Secondary | ICD-10-CM

## 2015-03-30 DIAGNOSIS — Z170 Estrogen receptor positive status [ER+]: Secondary | ICD-10-CM

## 2015-03-30 DIAGNOSIS — C7802 Secondary malignant neoplasm of left lung: Secondary | ICD-10-CM | POA: Diagnosis not present

## 2015-03-30 DIAGNOSIS — D6481 Anemia due to antineoplastic chemotherapy: Secondary | ICD-10-CM

## 2015-03-30 DIAGNOSIS — C773 Secondary and unspecified malignant neoplasm of axilla and upper limb lymph nodes: Secondary | ICD-10-CM | POA: Diagnosis not present

## 2015-03-30 DIAGNOSIS — C7951 Secondary malignant neoplasm of bone: Secondary | ICD-10-CM

## 2015-03-30 DIAGNOSIS — Z79811 Long term (current) use of aromatase inhibitors: Secondary | ICD-10-CM

## 2015-03-30 DIAGNOSIS — C7801 Secondary malignant neoplasm of right lung: Secondary | ICD-10-CM

## 2015-03-30 NOTE — Progress Notes (Signed)
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 [facility])   Staging form: Breast, AJCC 7th Edition     Clinical: Stage IV (T4b, N1, M1) - Signed by Rulon Eisenmenger, MD on 12/30/2013     Pathologic: No stage assigned - Unsigned   SUMMARY OF ONCOLOGIC HISTORY:   Breast cancer of 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 Diffuse hypermetabolic right breast inflammatory carcinoma.  Metastatic lymphadenopathy in the right axilla, mediastinum, and bilateral hilar regions, small bilateral pulmonary metastases, possible 9th rib metastases   12/30/2013 - 04/14/2014 Chemotherapy Taxotere and cytoxan given on day 1 of a 21 day cycle with neulasta given on day 2 for granulocyte support.  A total of 6 cycles are planned with CT chest and mammo/ultrasound planned after cycle 3.    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 Valere Dross to chest wall and axilla   08/23/2014 -  Anti-estrogen oral therapy Anastrozole 1 mg daily   12/14/2014 Imaging MRI right shoulder: Bone metastases involving the glenoid and coracoid periostitis and possible extraosseous ext of tumor causing soft tissue edema, ? nondisplaced pathologic coracoid base fracture, lesions in proximal humerus, ext left axillary lymphaden   12/14/2014 Relapse/Recurrence Left Axill LN Biopsy: Metastatic cancer Er 20%, PR 0%, Her 2 Neg   12/18/2014 PET scan Widespread progression, Left Axill LN, Sub pectoralis LN, Bil Hilar LN,  RUL Lung nodule, RML nodule, RUL nodule, Rt Effusion, Musculature Rt chest wall, Bone mets, Liver mets   12/22/2014 - 02/09/2015 Chemotherapy Palliative chemotherapy with Halaven day 1 day 8 every 3 weeks 3 cycles, CT scans 02/19/2015 show stable disease without any response in the liver   02/19/2015 Imaging Ct CAP: Stable left axillary and sub pect LN mets, stable medial Rt chest wall mets, Dec Pleural eff, Inc Left Eff, Dec Lung nodules, No sign change in liver mets (difficult to assess), bone mets not well seen   03/05/2015 -  Anti-estrogen oral therapy Ibrance 3 weeks on 1 week off plus letrozole daily   03/21/2015 - 03/21/2015 Radiation Therapy Left shoulder 800 cGy in one session    CHIEF  COMPLIANT: progressive deterioration of performance status  INTERVAL HISTORY: Karen Douglas is a 59 year old with above-mentioned history metastatic breast cancer who has been currently on Ibrance with letrozole. She has had malignant pleural effusion for which she underwent a Pleurx catheter  placement. In spite of this her breathing continues to be bad. She is extremely weak she cannot even lie in bed. She sleeps sitting up in a chair. She has profound swelling of her legs. Her pain is under reasonable control.  REVIEW OF SYSTEMS:   Constitutional: Denies fevers, chills or abnormal weight loss Eyes: Denies blurriness of vision Ears, nose, mouth, throat, and face: Denies mucositis or sore throat Respiratory: severe shortness of breath requires oxygen Cardiovascular: Denies palpitation, chest discomfort Gastrointestinal:  Denies nausea, heartburn or change in bowel habits Skin: Denies abnormal skin rashes Lymphatics: Denies new lymphadenopathy or easy bruising Neurological:Denies numbness, tingling or new weaknesses Behavioral/Psych: Mood is stable, no new changes  Extremities: significant lower extremity edema  All other systems were reviewed with the patient and are negative.  I have reviewed the past medical history, past surgical history, social history and family history with the patient and they are unchanged from previous note.  ALLERGIES:  has No Known Allergies.  MEDICATIONS:  Current Outpatient Prescriptions  Medication Sig Dispense Refill  . atorvastatin (LIPITOR) 20 MG tablet Take 20 mg by mouth daily.    . calcium carbonate (OS-CAL) 600 MG TABS tablet Take 600 mg by mouth daily with breakfast.    . letrozole (FEMARA) 2.5 MG tablet Take 1 tablet (2.5 mg total) by mouth daily. 90 tablet 3  . levofloxacin (LEVAQUIN) 750 MG tablet Take 1 tablet (750 mg total) by mouth daily. 10 tablet 0  . lidocaine-prilocaine (EMLA) cream Apply to affected area once 30 g 3  . LORazepam (ATIVAN) 0.5 MG tablet Take 1 tablet (0.5 mg total) by mouth every 8 (eight) hours as needed for anxiety. 30 tablet 0  . ondansetron (ZOFRAN) 8 MG tablet TAKE 1 TABLET BY MOUTH TWICE A DAY (START THE DAY AFTER CHEMO FOR 2 DAY THEN AS NEEDED)  1  . oxyCODONE-acetaminophen (PERCOCET/ROXICET) 5-325 MG  tablet Take 1 tablet by mouth every 8 (eight) hours as needed for severe pain. for pain 60 tablet 0  . palbociclib (IBRANCE) 125 MG capsule Take 1 capsule (125 mg total) by mouth daily with breakfast. whole with food for 21 days, then take 7 days off.  Cycle 2 to start 12/27 21 capsule 0  . polyethylene glycol (MIRALAX / GLYCOLAX) packet Take 17 g by mouth daily as needed for mild constipation.    . potassium chloride SA (K-DUR,KLOR-CON) 20 MEQ tablet Take 1 tablet (20 mEq total) by mouth daily. 10 tablet 0  . prochlorperazine (COMPAZINE) 10 MG tablet TAKE 1 TABLET BY MOUTH EVERY 6 (SIX) HOURS AS NEEDED (NAUSEA OR VOMITING).  1   No current facility-administered medications for this visit.   Facility-Administered Medications Ordered in Other Visits  Medication Dose Route Frequency Provider Last Rate Last Dose  . sodium chloride 0.9 % injection 10 mL  10 mL Intravenous PRN Nicholas Lose, MD   10 mL at 02/13/15 1359    PHYSICAL EXAMINATION: ECOG PERFORMANCE STATUS: 4 - Bedbound  Filed Vitals:   03/30/15 0821  BP: 131/77  Pulse: 116  Temp: 97.9 F (36.6 C)  Resp: 16   Filed Weights    GENERAL:drowsy but answers questions SKIN: skin color, texture, turgor are normal, no rashes or significant lesions EYES:  mild erythema OROPHARYNX:no exudate, no erythema and lips, buccal mucosa, and tongue normal  NECK: supple, thyroid normal size, non-tender, without nodularity LUNGS: diminished breath sounds HEART: regular rate & rhythm and no murmurs and no lower extremity edema ABDOMEN:abdomen soft, non-tender and normal bowel sounds NEURO: drowsy, no focal motor/sensory deficits EXTREMITIES: No lower extremity edema   LABORATORY DATA:  I have reviewed the data as listed   Chemistry      Component Value Date/Time   NA 136 03/23/2015 0920   NA 140 05/17/2014 0544   K 3.3* 03/23/2015 0920   K 4.1 05/17/2014 0544   CL 111 05/17/2014 0544   CO2 26 03/23/2015 0920   CO2 24 05/17/2014 0544     BUN 11.2 03/23/2015 0920   BUN 13 05/17/2014 0544   CREATININE 0.9 03/23/2015 0920   CREATININE 0.81 05/17/2014 0544      Component Value Date/Time   CALCIUM 9.2 03/23/2015 0920   CALCIUM 8.3* 05/17/2014 0544   ALKPHOS 101 03/23/2015 0920   AST 43* 03/23/2015 0920   ALT 10 03/23/2015 0920   BILITOT 0.51 03/23/2015 0920       Lab Results  Component Value Date   WBC 2.0* 03/23/2015   HGB 10.0* 03/23/2015   HCT 31.7* 03/23/2015   MCV 81.9 03/23/2015   PLT 129 Few Large & giant platelets* 03/23/2015   NEUTROABS 1.4* 03/23/2015     ASSESSMENT & PLAN:  Breast cancer of 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 (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, 3/4 lymph nodes positive, lymphovascular invasion present, ER 99%, PR 51%, HER-2 negative, Ki-67 95% T3 N1 stage IIIa, completed radiation therapy 08/15/2014, started anastrozole 08/23/2014 stopped 12/18/2014 for progression  PET CT 12/18/14: Widespread progression, Left Axill LN, Sub pectoralis LN, Bil Hilar LN, RUL Lung nodule, RML nodule, RUL nodule, Rt Effusion, Musculature Rt chest wall, Bone mets, Liver mets  CT chest abdomen pelvis 02/19/2015: Stable left axillary and sub pect LN mets, stable medial Rt chest wall mets, Dec Pleural eff, Inc Left Eff, Dec Lung nodules, No sign change in liver mets (difficult to assess), bone mets not well seen  Prior Treatment: Palliative systemic chemotherapy with Halaven 12/22/2014 D1 day 8 every 3 weeks 3 cycles with not much significant reduction in the liver lesions. Hence we decided to stop Halaven.  Current  treatment: Ibrance with letrozole started 03/05/2015  Pleural effusion: Status post thoracentesis on 02/13/2015 and 03/06/15 /positive for breast cancer. Pleurx catheter was placed on 03/16/2015 (500 ml removed). In spite of this, patient continues to be severely short of breath. Home health is assisting her with thoracentesis.   Bone Mets: Zometa with calcium and vitamin D.  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 Grade 1 neutropenia ANC 1.4. Recommended continuation of the same dosage of Ibrance for cycle 2. 2. occasional itching of the skin   Decline in performance status: Patient's physical energy and strength aren't declining significantly. She is becoming more frail and I believe that this is a sign of progression towards dying. I discussed with the family regarding her CODE STATUS. We also discussed hospice care as a potential option. I  believe further treatments are futile and that we should focus on comfort care alone.palliative care has seen her at her home. I discussed with them that hospice can provide additional services including hospital bed and wheelchairs etc.  Final decision: Hospice care at home I spoke to patient's daughter at length. I anticipate that she would not have more than a few weeks and that they should prepare mentally and emotionally for her dying. Discontinue Ibrance and letrozole  No orders of the defined types were placed in this encounter.   The patient has a good understanding of the overall plan. she agrees with it. she will call with any problems that may develop before the next visit here.   Rulon Eisenmenger, MD 03/30/2015

## 2015-03-30 NOTE — Assessment & Plan Note (Signed)
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, 3/4 lymph nodes positive, lymphovascular invasion present, ER 99%, PR 51%, HER-2 negative, Ki-67 95% T3 N1 stage IIIa, completed radiation therapy 08/15/2014, started anastrozole 08/23/2014 stopped 12/18/2014 for progression  PET CT 12/18/14: Widespread progression, Left Axill LN, Sub pectoralis LN, Bil Hilar LN, RUL Lung nodule, RML nodule, RUL nodule, Rt Effusion, Musculature Rt chest wall, Bone mets, Liver mets  CT chest abdomen pelvis 02/19/2015: Stable left axillary and sub pect LN mets, stable medial Rt chest wall mets, Dec Pleural eff, Inc Left Eff, Dec Lung nodules, No sign change in liver mets (difficult to assess), bone mets not well seen  Prior Treatment: Palliative systemic chemotherapy with Halaven 12/22/2014 D1 day 8 every 3 weeks 3 cycles with not much significant reduction in the liver lesions. Hence we decided to stop Halaven.  Current treatment: Ibrance with letrozole started 03/05/2015  Pleural effusion: Status post thoracentesis on 02/13/2015 and 03/06/15 /positive for breast cancer. Pleurx catheter was placed on 03/16/2015 (500 ml removed). In spite of this, patient continues to be severely short of breath. Home health is assisting her with thoracentesis.   Bone Mets: Zometa with calcium and vitamin D.  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 Grade 1 neutropenia ANC 1.4. Recommended continuation of the same dosage of Ibrance for cycle 2. 2. occasional itching of the skin   Decline in performance status: Patient's physical energy and  strength aren't declining significantly. She is becoming more frail and I believe that this is a sign of progression towards dying. I discussed with the family regarding her CODE STATUS. We also discussed hospice care as a potential option. I believe further treatments are futile and that we should focus on comfort care alone.palliative care has seen her at her home. I discussed with them that hospice can provide additional services including hospital bed and wheelchairs etc.

## 2015-04-03 ENCOUNTER — Encounter: Payer: Self-pay | Admitting: *Deleted

## 2015-04-03 NOTE — Progress Notes (Signed)
[place] Psychosocial Distress Screening Clinical Social Work  Clinical Social Work was referred by distress screening protocol.  The patient scored a 8 on the Psychosocial Distress Thermometer which indicates severe distress. Clinical Social Worker reviewed chart and phoned pt to assess for distress and other psychosocial needs. Pt reports things "are not good". Pt reports family is with her as well, but she does not feel good. Pt had questions if she "was supposed to keep taking this chemo pill". Pt reports hospice has been to the home and they are helpful. Pt would like RN to call her re. If she should still take chemo pills. CSW provided supportive listening and support via phone. CSW to make referral to RN to phone pt re chemo pill.   ONCBCN DISTRESS SCREENING 03/14/2015  Screening Type Initial Screening  Distress experienced in past week (1-10) 8  Practical problem type   Family Problem type   Emotional problem type Adjusting to illness  Spiritual/Religous concerns type Relating to God;Loss of Faith  Physical Problem type Breathing;Loss of appetitie;Constipation/diarrhea  Physician notified of physical symptoms Yes  Referral to clinical social work Yes  Referral to support programs   Other Refused     Clinical Social Worker follow up needed: Yes.    If yes, follow up plan: Referral to RN for medication clarification  Loren Racer, [place]  [facility] Phone: [phone] Fax: [phone]

## 2015-04-04 ENCOUNTER — Encounter: Payer: Commercial Managed Care - HMO | Admitting: Cardiothoracic Surgery

## 2015-04-10 ENCOUNTER — Telehealth: Payer: Self-pay

## 2015-04-10 NOTE — Telephone Encounter (Signed)
Per note below, let [name] know pt does not need to come for zometa on 04/19/15.  Confirmed pt prognosis per MD notes dtd 12/23.  [name] voiced understanding.

## 2015-04-10 NOTE — Telephone Encounter (Signed)
-----   Message from Tomasa Rand, RN sent at 04/06/2015  4:09 PM EST ----- Please call son [name] at [phone] to let him know if he needs to get patient here for Zometa on 04-19-2015. Thanks, Burman Nieves

## 2015-04-11 ENCOUNTER — Emergency Department (HOSPITAL_COMMUNITY)
Admission: EM | Admit: 2015-04-11 | Discharge: 2015-05-09 | Disposition: E | Payer: Commercial Managed Care - HMO | Attending: Emergency Medicine | Admitting: Emergency Medicine

## 2015-04-11 ENCOUNTER — Emergency Department (HOSPITAL_COMMUNITY): Payer: Commercial Managed Care - HMO

## 2015-04-11 ENCOUNTER — Encounter (HOSPITAL_COMMUNITY): Payer: Commercial Managed Care - HMO

## 2015-04-11 ENCOUNTER — Encounter (HOSPITAL_COMMUNITY): Payer: Self-pay | Admitting: Emergency Medicine

## 2015-04-11 DIAGNOSIS — R000 Tachycardia, unspecified: Secondary | ICD-10-CM | POA: Diagnosis not present

## 2015-04-11 DIAGNOSIS — R609 Edema, unspecified: Secondary | ICD-10-CM | POA: Insufficient documentation

## 2015-04-11 DIAGNOSIS — R062 Wheezing: Secondary | ICD-10-CM | POA: Insufficient documentation

## 2015-04-11 DIAGNOSIS — K5900 Constipation, unspecified: Secondary | ICD-10-CM | POA: Insufficient documentation

## 2015-04-11 DIAGNOSIS — R0600 Dyspnea, unspecified: Secondary | ICD-10-CM | POA: Diagnosis not present

## 2015-04-11 DIAGNOSIS — Z853 Personal history of malignant neoplasm of breast: Secondary | ICD-10-CM | POA: Insufficient documentation

## 2015-04-11 DIAGNOSIS — Z79899 Other long term (current) drug therapy: Secondary | ICD-10-CM | POA: Insufficient documentation

## 2015-04-11 DIAGNOSIS — I469 Cardiac arrest, cause unspecified: Secondary | ICD-10-CM | POA: Insufficient documentation

## 2015-04-11 DIAGNOSIS — Z85830 Personal history of malignant neoplasm of bone: Secondary | ICD-10-CM | POA: Insufficient documentation

## 2015-04-11 MED ORDER — ATROPINE SULFATE 1 MG/ML IJ SOLN
INTRAMUSCULAR | Status: AC | PRN
  Administered 2015-04-11: 1 mg via INTRAVENOUS

## 2015-04-11 MED ORDER — NOREPINEPHRINE BITARTRATE 1 MG/ML IV SOLN
8.0000 ug/min | Freq: Once | INTRAVENOUS | Status: AC
  Administered 2015-04-11: 8 ug/min via INTRAVENOUS

## 2015-04-11 MED ORDER — ROCURONIUM BROMIDE 50 MG/5ML IV SOLN
1.0000 mg/kg | Freq: Once | INTRAVENOUS | Status: AC
  Administered 2015-04-11: 100 mg via INTRAVENOUS

## 2015-04-11 MED ORDER — SODIUM CHLORIDE 0.9 % IV BOLUS (SEPSIS)
2000.0000 mL | Freq: Once | INTRAVENOUS | Status: AC
  Administered 2015-04-11: 2000 mL via INTRAVENOUS

## 2015-04-11 MED ORDER — EPINEPHRINE HCL 0.1 MG/ML IJ SOSY
PREFILLED_SYRINGE | INTRAMUSCULAR | Status: AC | PRN
  Administered 2015-04-11: 1 mg via INTRAVENOUS

## 2015-04-18 ENCOUNTER — Encounter: Payer: Commercial Managed Care - HMO | Admitting: Cardiothoracic Surgery

## 2015-04-19 ENCOUNTER — Other Ambulatory Visit: Payer: Commercial Managed Care - HMO

## 2015-04-19 ENCOUNTER — Ambulatory Visit: Payer: Commercial Managed Care - HMO | Admitting: Hematology and Oncology

## 2015-04-19 ENCOUNTER — Ambulatory Visit: Payer: Commercial Managed Care - HMO

## 2015-04-26 ENCOUNTER — Ambulatory Visit: Payer: Commercial Managed Care - HMO | Admitting: Radiation Oncology

## 2015-05-09 NOTE — Code Documentation (Addendum)
Pulse check: No pulse . ET tube pulled out 6 cm. by RT. Chest compressions continues.

## 2015-05-09 NOTE — Code Documentation (Addendum)
Pulse check : Strong at femoral and carotid. NS bolus IV infusing .

## 2015-05-09 NOTE — ED Notes (Signed)
Transported to [employer] , security / bed control notified.

## 2015-05-09 NOTE — ED Notes (Signed)
Pt. arrived with EMS from home witnessed cardiac arrest at home , received 4 doses of Epinephrine and Narcan 4 mg per I/O right tibia. Sinus tachycardia at arrival. Intubated using king airway at scene.

## 2015-05-09 NOTE — Code Documentation (Signed)
Pulse check : No pulse , chest compressions continues.

## 2015-05-09 NOTE — Code Documentation (Signed)
Pulse check: No pulse - chest compressions continues.

## 2015-05-09 NOTE — ED Notes (Signed)
Family at bedside. 

## 2015-05-09 NOTE — Code Documentation (Signed)
Pulse check : No pulse - EDP declared dead at 26 .

## 2015-05-09 NOTE — Code Documentation (Signed)
Portable chest xray done.

## 2015-05-09 NOTE — Code Documentation (Signed)
Chest compressions started again for PEA .

## 2015-05-09 NOTE — ED Provider Notes (Signed)
CSN: [id number]     Arrival date & time 04/19/2015  0451 History   First MD Initiated Contact with Patient 2015-04-19 [phone]     Chief Complaint  Patient presents with  . Cardiac Arrest     (Consider location/radiation/quality/duration/timing/severity/associated sxs/prior Treatment) HPI  Karen Douglas is a 60yo female, PMH breast cancer with mets to the bone, on hospice, presenting in cardiac arrest.  History obtained from EMS who states she slumped over and was found pulseless.  They initiated CPR and gave 4 rounds of epi. They achieved ROSC.   Past Medical History  Diagnosis Date  . Headache   . Cancer ([place]) 11/28/13 bx    breast  . Breast cancer ([place]) 11/28/13    right  invasive mammary ca, metastatic, mammary ca in situ  . 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 Status post chemotherapy     Completed 6 cycles of Taxotere Cytoxan.  . S/P radiation therapy 07/04/2014 through 08/23/2014    Right chest wall and regional lymph nodes 5040 cGy in 28 sessions. Right chest wall/ mastectomy scar boost 1000 cGy 5 sessions, recurrent right chest wall nodule boost 600 cGy in 3 sessions.  . 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 Shortness of breath dyspnea     due to pleural effusion  . Constipation    Past Surgical History  Procedure Laterality Date  . Finger surgery    . Portacath placement N/A 12/19/2013    Procedure: INSERTION PORT-A-CATH;  Surgeon: Autumn Messing III, MD;  Location: [facility];  Service: General;  Laterality: N/A;  . Mastectomy modified radical Right 05/15/2014    Procedure: RIGHT MASTECTOMY MODIFIED RADICAL;  Surgeon: Autumn Messing III, MD;  Location: [facility];  Service: General;  Laterality: Right;  . Abdominal hysterectomy  1988    cysts on ovaries  . Chest tube insertion Left 03/16/2015    Procedure: INSERTION PLEURAL DRAINAGE CATHETER;  Surgeon: Ivin Poot, MD;  Location: [facility] OR;  Service: Thoracic;  Laterality: Left;   Family History  Problem Relation Age of Onset   . Diabetes Mother   . Heart attack Mother   . Tuberculosis Father   . Cancer Maternal Aunt     unknown type of cancer   Social History  Substance Use Topics  . Smoking status: Never Smoker   . Smokeless tobacco: Former Systems developer    Types: Snuff    Quit date: 12/10/2013  . Alcohol Use: 0.0 oz/week    0 Standard drinks or equivalent per week     Comment: weekend usage  2-3 d   none since 8/15   OB History    Gravida Para Term Preterm AB TAB SAB Ectopic Multiple Living   2 2              Obstetric Comments   She menarched at early age of 31 and went to menopause at age 24  She had 2 pregnancy, her first child was born at age 79  She did not received birth control pills for approximately 0.  She was never exposed to fertility medications or hormone repl acement therapy.  She has no family history of Breast/GYN/GI cancer      Review of Systems  Unable to perform ROS: Acuity of condition      Allergies  Review of patient's allergies indicates no known allergies.  Home Medications   Prior to Admission medications   Medication Sig  Start Date End Date Taking? Authorizing Provider  atorvastatin (LIPITOR) 20 MG tablet Take 20 mg by mouth daily.    Historical Provider, MD  calcium carbonate (OS-CAL) 600 MG TABS tablet Take 600 mg by mouth daily with breakfast.    Historical Provider, MD  letrozole (FEMARA) 2.5 MG tablet Take 1 tablet (2.5 mg total) by mouth daily. 02/23/15   Nicholas Lose, MD  levofloxacin (LEVAQUIN) 750 MG tablet Take 1 tablet (750 mg total) by mouth daily. 03/13/15   Nicholas Lose, MD  lidocaine-prilocaine (EMLA) cream Apply to affected area once 12/15/14   Nicholas Lose, MD  LORazepam (ATIVAN) 0.5 MG tablet Take 1 tablet (0.5 mg total) by mouth every 8 (eight) hours as needed for anxiety. 03/26/15   Susanne Borders, NP  ondansetron (ZOFRAN) 8 MG tablet TAKE 1 TABLET BY MOUTH TWICE A DAY (START THE DAY AFTER CHEMO FOR 2 DAY THEN AS NEEDED) 12/15/14   Historical  Provider, MD  oxyCODONE-acetaminophen (PERCOCET/ROXICET) 5-325 MG tablet Take 1 tablet by mouth every 8 (eight) hours as needed for severe pain. for pain 03/13/15   Nicholas Lose, MD  palbociclib [facility]) 125 MG capsule Take 1 capsule (125 mg total) by mouth daily with breakfast. whole with food for 21 days, then take 7 days off.  Cycle 2 to start 12/27 03/23/15   Nicholas Lose, MD  polyethylene glycol (MIRALAX / GLYCOLAX) packet Take 17 g by mouth daily as needed for mild constipation.    Historical Provider, MD  potassium chloride SA (K-DUR,KLOR-CON) 20 MEQ tablet Take 1 tablet (20 mEq total) by mouth daily. 02/13/15   Susanne Borders, NP  prochlorperazine (COMPAZINE) 10 MG tablet TAKE 1 TABLET BY MOUTH EVERY 6 (SIX) HOURS AS NEEDED (NAUSEA OR VOMITING). 12/15/14   Historical Provider, MD   BP 93/62 mmHg  Pulse 129  Resp 20  Wt 180 lb (81.647 kg)  SpO2 80% Physical Exam  Constitutional: She appears well-developed and well-nourished. No distress.  HENT:  Head: Normocephalic and atraumatic.  Nose: Nose normal.  Mouth/Throat: Oropharynx is clear and moist. No oropharyngeal exudate.  King airway in place  Eyes: Conjunctivae and EOM are normal. No scleral icterus.  Neck: Normal range of motion. Neck supple. No JVD present. No tracheal deviation present. No thyromegaly present.  Cardiovascular: Regular rhythm and normal heart sounds.  Exam reveals no gallop and no friction rub.   No murmur heard. tachycardia  Pulmonary/Chest: She is in respiratory distress. She has wheezes. She has rales. She exhibits no tenderness.  Abdominal: Soft. Bowel sounds are normal. She exhibits no distension and no mass. There is no tenderness. There is no rebound and no guarding.  Musculoskeletal: Normal range of motion. She exhibits edema. She exhibits no tenderness.  Lymphadenopathy:    She has no cervical adenopathy.  Neurological:  GCS<3, patient completely non-responsive with fixed and dilated pupils.    Skin:  Skin is warm and dry. No rash noted. No erythema. No pallor.  Nursing note and vitals reviewed.   ED Course  Procedures (including critical care time) Labs Review Labs Reviewed  URINE CULTURE    Imaging Review Dg Chest Portable 1 View  May 10, 2015  CLINICAL DATA:  Endotracheal tube placement. Status post CPR. Initial encounter. EXAM: PORTABLE CHEST 1 VIEW COMPARISON:  Chest radiograph performed 03/16/2015 FINDINGS: The patient's endotracheal tube is noted extending overlying the right mainstem bronchus, 3 cm below the carina. This should be retracted approximately 6 cm. There is new right upper lobe airspace opacification,  and complete opacification of the left lung, reflecting right mainstem bronchus intubation. The lungs are hypoexpanded. No definite pleural effusion or pneumothorax is seen. Underlying previously noted left-sided airspace opacification is not well characterized. The cardiomediastinal silhouette is not well characterized due to surrounding opacification. A left-sided chest port is noted ending about the distal SVC. External pacing pads are noted. No acute osseous abnormalities are seen. Scattered clips are noted overlying the right axilla. IMPRESSION: 1. Endotracheal tube noted extending overlying the right mainstem bronchus, 3 cm below the carina. This should be retracted approximately 6 cm. 2. New right upper lobe airspace opacification, and complete opacification of the left lung, reflecting intubation. Previously noted underlying left-sided airspace opacification is not well characterized. Lungs hypoexpanded. These results were called by telephone at the time of interpretation on Apr 24, 2015 at 5:27 am to Nursing in the [facility], who verbally acknowledged these results. Electronically Signed   By: Garald Balding M.D.   On: 2015-04-24 05:27   I have personally reviewed and evaluated these images and lab results as part of my medical decision-making.   EKG  Interpretation None      MDM   Final diagnoses:  Cardiac arrest [facility])     Upon patient arrival she was started on IVF and vasopressors.  She required intubation as well for a GCS of 3.  She went into cardiac arrest 2 more times in the ED and achieved ROSC.  She continued to have sinus tachycardia, leading me to believe the cause of death may have been a PE.  This may have also been end stage cancer as the cause, as the patient is on hospice and the family states she was 2 days away from signing a DBR order.   The last CPR we did not achieve ROSC.  I spoke with Liston Alba who states this is not a case appropriate for the ME.  Family was notified and tearful in the consult room. Time of death 05:29am  INTUBATION Performed by: Everlene Balls  Required items: required blood products, implants, devices, and special equipment available Patient identity confirmed: provided demographic data and hospital-assigned identification number Time out: Immediately prior to procedure a "time out" was called to verify the correct patient, procedure, equipment, support staff and site/side marked as required.  Indications: GCS 3  Intubation method: Direct Laryngoscopy   Preoxygenation: King Airway  Sedatives: none Paralytic: Rocuronium  Tube Size: 7.5 cuffed  Post-procedure assessment: chest rise and ETCO2 monitor Breath sounds: equal and absent over the epigastrium Tube secured with: ETT holder Chest x-ray interpreted by radiologist and me.  Chest x-ray findings: endotracheal tube in appropriate position  Patient tolerated the procedure well with no immediate complications.     Cardiopulmonary Resuscitation (CPR) Procedure Note Directed/Performed by: Everlene Balls I personally directed ancillary staff and/or performed CPR in an effort to regain return of spontaneous circulation and to maintain cardiac, neuro and systemic perfusion.    Everlene Balls, MD Apr 24, 2015 1539

## 2015-05-09 NOTE — Progress Notes (Signed)
   04-28-15 0600  Clinical Encounter Type  Visited With Family  Visit Type ED  Referral From Nurse  Spiritual Encounters  Spiritual Needs Emotional;Prayer;Grief support  Stress Factors  Family Stress Factors Loss  Chaplain responded to trauma B for post CPR. Escorted family to sub-waiting and waited for doctor to brief them. Patient died soon after; returned with doctor to be with them when they received the news. Escorted family to see deceased. Obtained appropriate information.

## 2015-05-09 NOTE — Code Documentation (Signed)
Chest compression initiated for PEA.

## 2015-05-09 DEATH — deceased

## 2015-06-22 ENCOUNTER — Other Ambulatory Visit: Payer: Self-pay | Admitting: Nurse Practitioner

## 2016-10-10 IMAGING — US US THORACENTESIS ASP PLEURAL SPACE W/IMG GUIDE
1 series · 2 of 2 positions shown · non-contrast
Comparison: None

MEDICATIONS:
None

COMPLICATIONS:
None immediate

INDICATION: Breast cancer, dyspnea, cough, left pleural effusion. Request is
made for diagnostic and therapeutic left thoracentesis.

EXAM:
ULTRASOUND GUIDED DIAGNOSTIC AND THERAPEUTIC LEFT THORACENTESIS
TECHNIQUE: Informed written consent was obtained from the patient after a
discussion of the risks, benefits and alternatives to treatment. A
timeout was performed prior to the initiation of the procedure.

[Series 1: us thoracentesis asp pleural space w/img guide · 0.27mm/px · 2 of 2 slices shown]
[im 1/2]
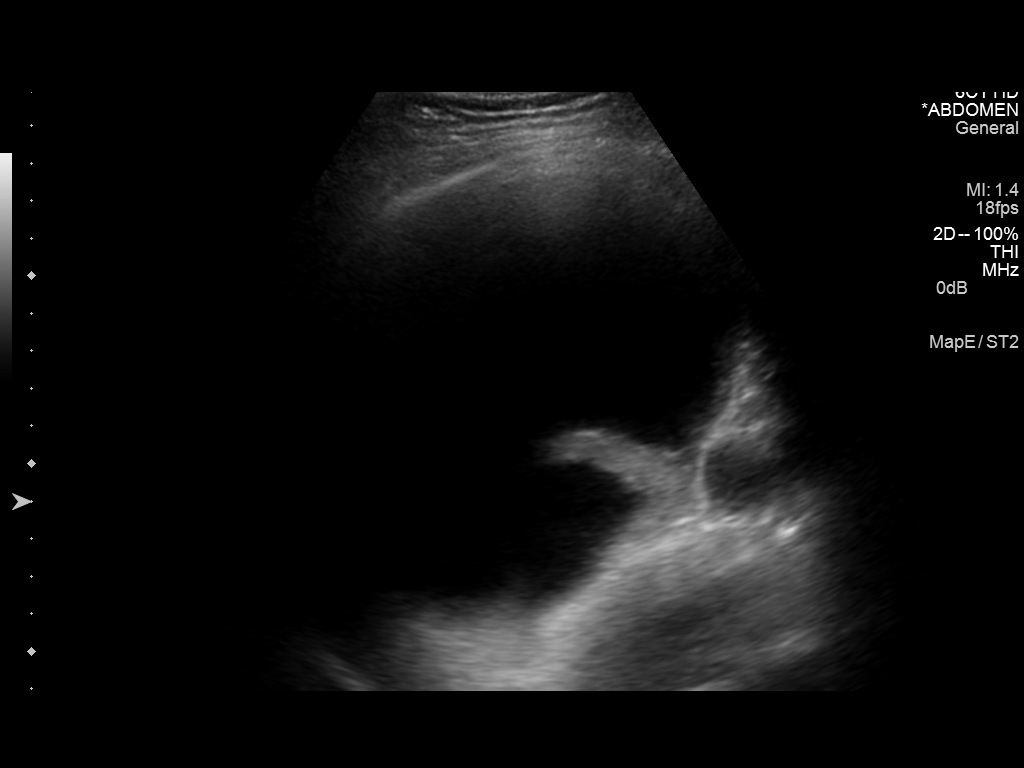
[im 2/2]
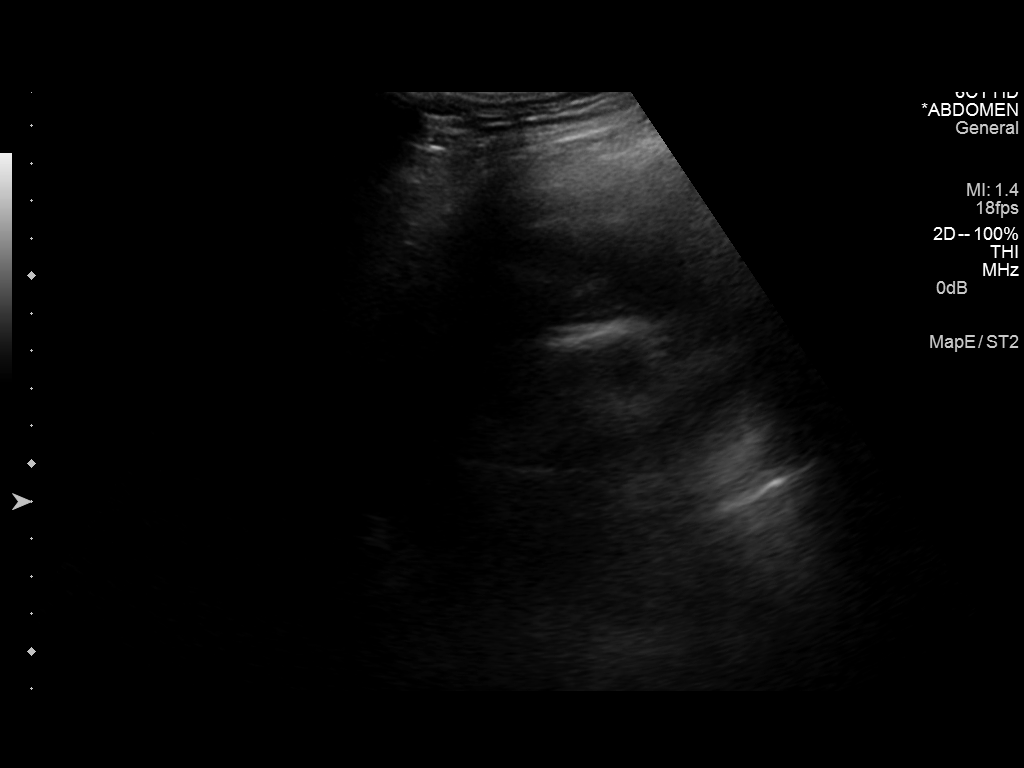

[2 of 2 positions shown; findings below may reference images not displayed]

Initial ultrasound scanning demonstrates a moderate-to-large left
pleural effusion. The lower chest was prepped and draped in the
usual sterile fashion. 1% lidocaine was used for local anesthesia.

An ultrasound image was saved for documentation purposes. A 6 Fr
Safe-T-Centesis catheter was introduced. The thoracentesis was
performed. The catheter was removed and a dressing was applied. The
patient tolerated the procedure well without immediate post
procedural complication. The patient was escorted to have an upright
chest radiograph.
FINDINGS: A total of approximately 1.2 liters of blood-tinged fluid was
removed. Requested samples were sent to the laboratory. Only the
above amount of fluid was removed today secondary to persistent
patient coughing.
IMPRESSION: Successful ultrasound-guided diagnostic and therapeutic left sided
thoracentesis yielding 1.2 liters of pleural fluid.

## 2016-11-08 IMAGING — CR DG CHEST 2V
2 series · 2 of 2 positions shown · non-contrast
Comparison: PA chest x-ray March 06, 2015

CLINICAL DATA: Two month history of exertional shortness of breath,
known left pleural effusion, known breast malignancy metastatic to
the ribs

EXAM:
CHEST  2 VIEW

[w chest pa]
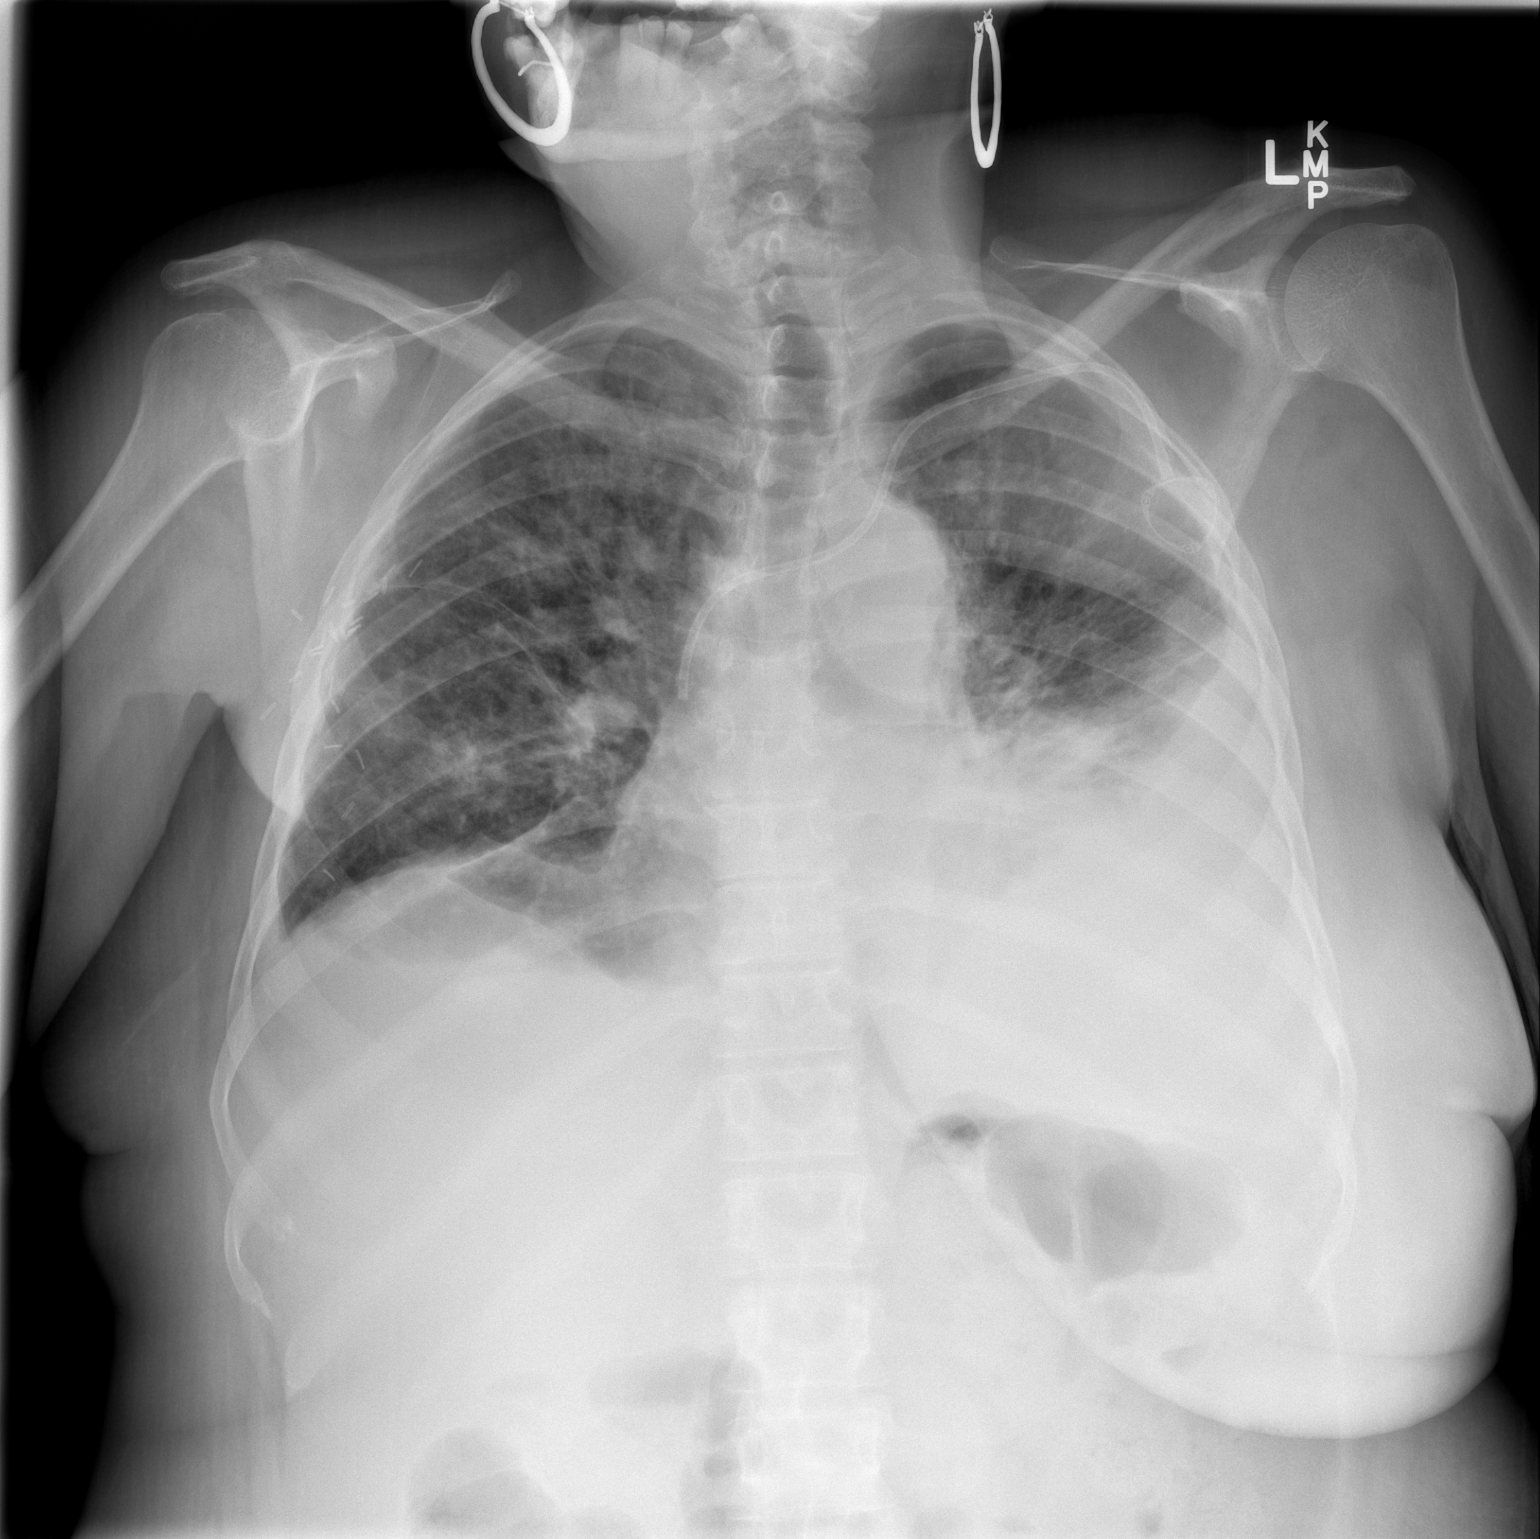

[w chest lat]
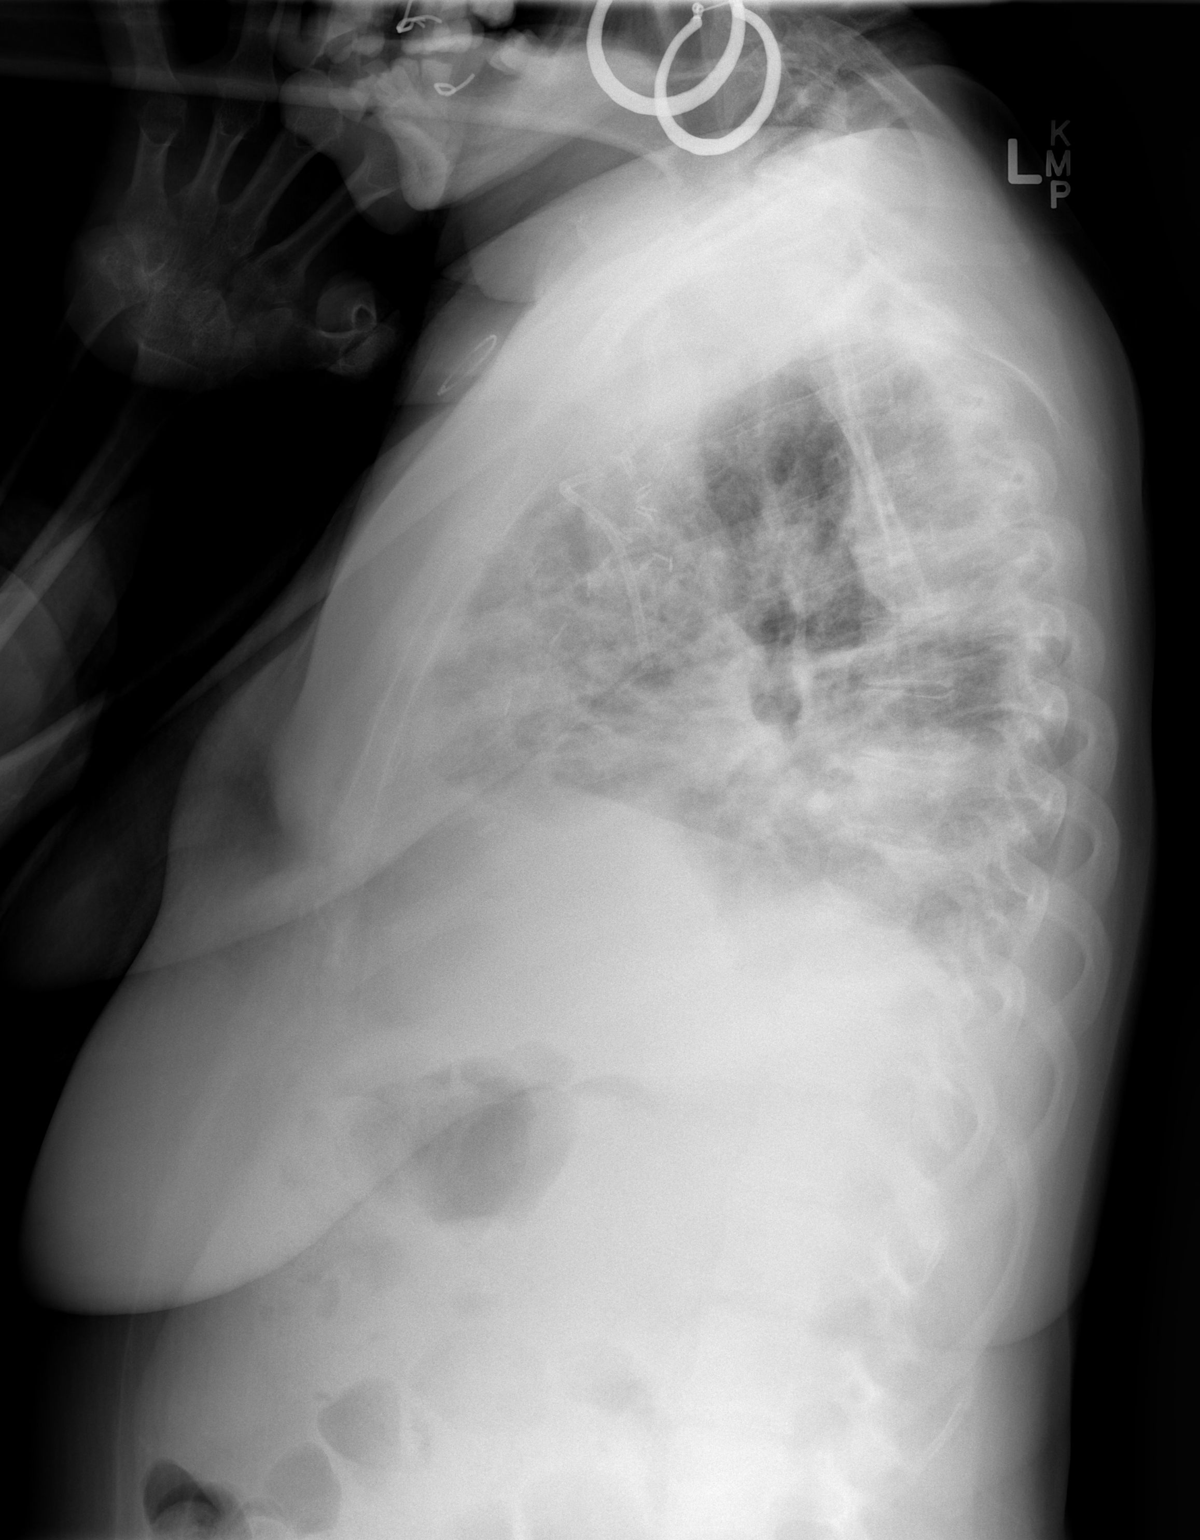

[2 of 2 positions shown; findings below may reference images not displayed]

FINDINGS: The right lung is borderline hypoinflated. The interstitial markings
are increased and more conspicuous today. There is a small right
pleural effusion not significantly changed from the previous study.
On the left the moderate-sized pleural effusion has increased in
volume. The left hemidiaphragm remains obscured. The cardiac
silhouette remains enlarged. The pulmonary vascularity is engorged.
The Port-A-Cath appliance tip projects over the midportion of the
SVC. There are numerous surgical clips in the right axillary region.
There is multilevel degenerative disc disease of the thoracic spine.
IMPRESSION: Slight interval increase in the volume of the moderate size left
pleural effusion. Stable small right pleural effusion. The pulmonary
interstitial markings bilaterally consistent with low-grade CHF or
less likely interstitial pneumonia.

## 2016-11-10 IMAGING — CR DG CHEST 1V PORT
1 series · 1 of 1 positions shown · non-contrast
Comparison: Study obtained earlier in the day

CLINICAL DATA: PLEURX CATHETER PLACEMENT

EXAM:
DG C-ARM 1-60 MIN-NO REPORT; PORTABLE CHEST - 1 VIEW

[AP]
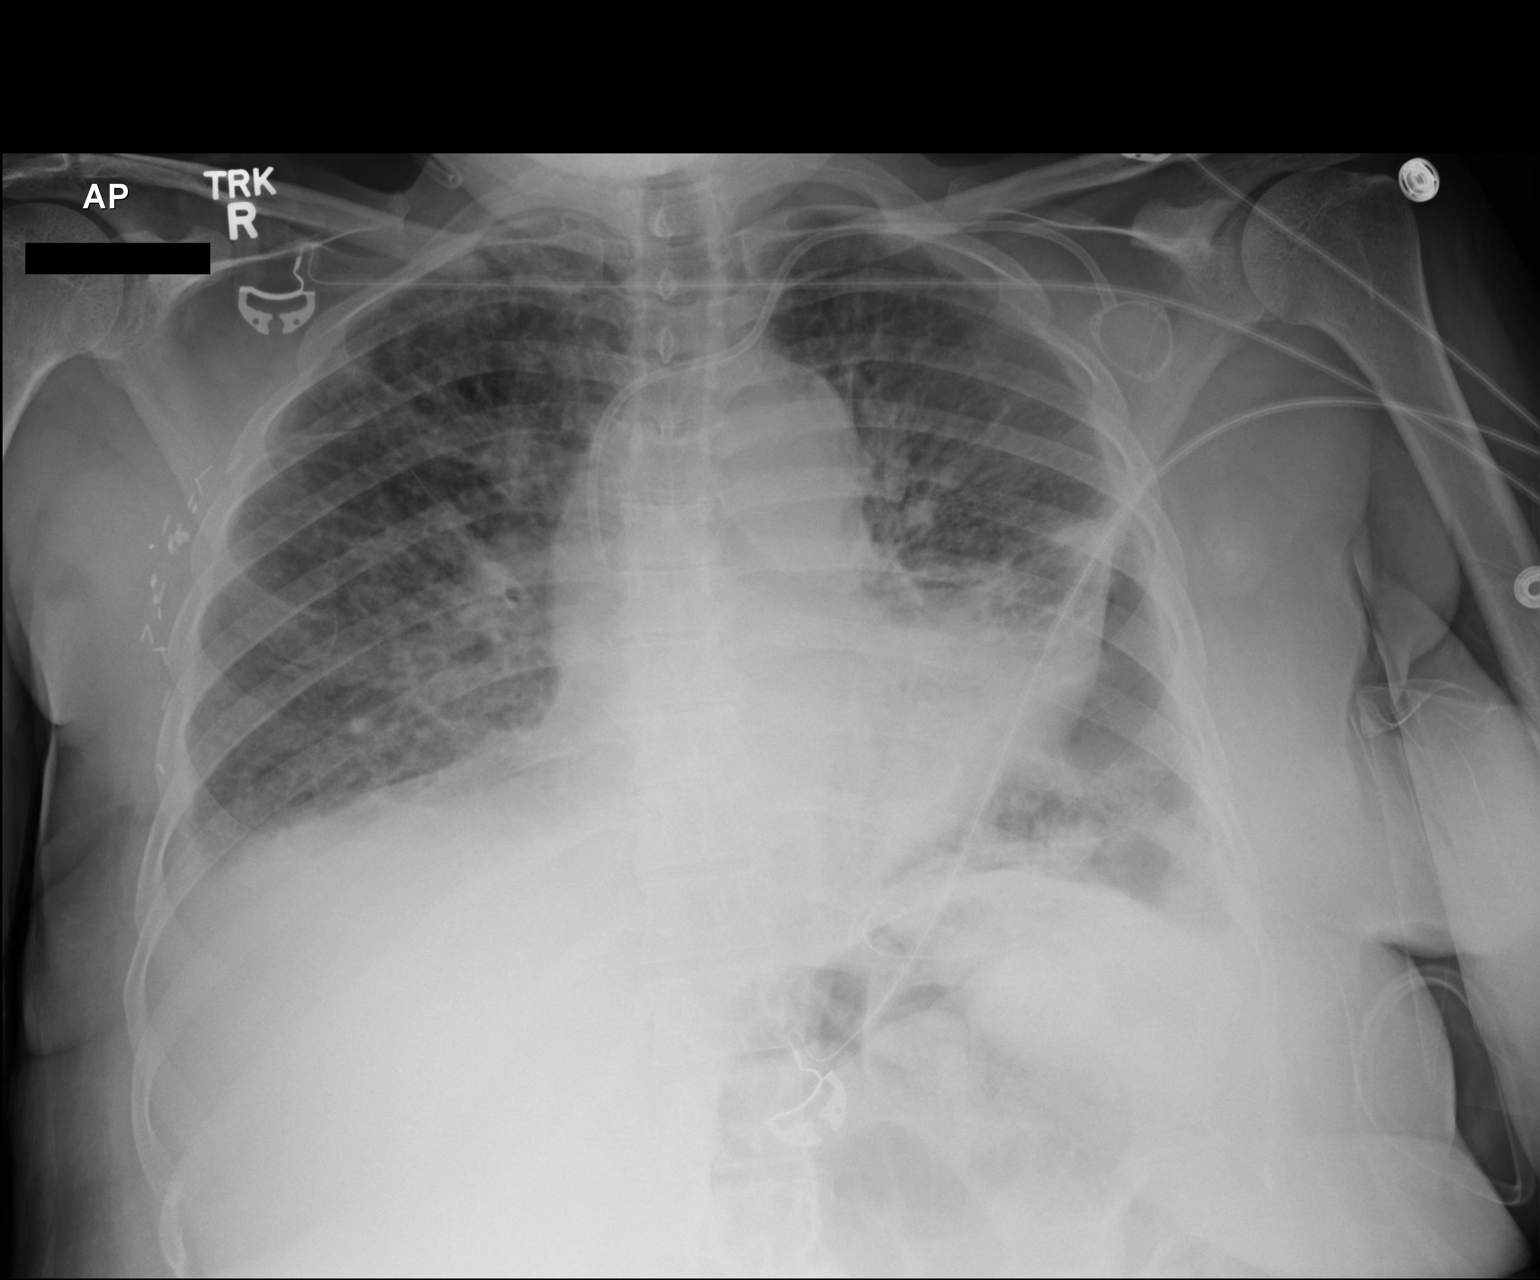

[1 of 1 positions shown; findings below may reference images not displayed]

FINDINGS: Tunneled catheter at the left base is stable. The pneumothorax in
lateral left base is stable, likely due to recent evacuation of
pleural fluid without re-expansion of the adjacent lung collapse.
There is focal collapse/atelectasis in the left base which is
stable. There is a small pleural effusion on the right, slightly
smaller. There is mild underlying interstitial edema with mild
cardiac prominence and pulmonary venous hypertension. There are
surgical clips the right axillary region. Port-A-Cath tip is in the
superior vena cava.
IMPRESSION: Postoperative changes on the left remain with persistent
atelectasis/collapse the left base and adjacent to that pneumothorax
from evacuation of pleural fluid. No tension component. Right
pleural effusion smaller. No change in cardiac silhouette. Evidence
a degree of underlying pulmonary vascular congestion with mild
interstitial edema, stable.

## 2016-11-10 IMAGING — CR DG CHEST 1V PORT
1 series · 1 of 1 positions shown · non-contrast
Comparison: Two days ago

CLINICAL DATA: Left lung PleurX catheter placement

EXAM:
PORTABLE CHEST 1 VIEW

[AP]
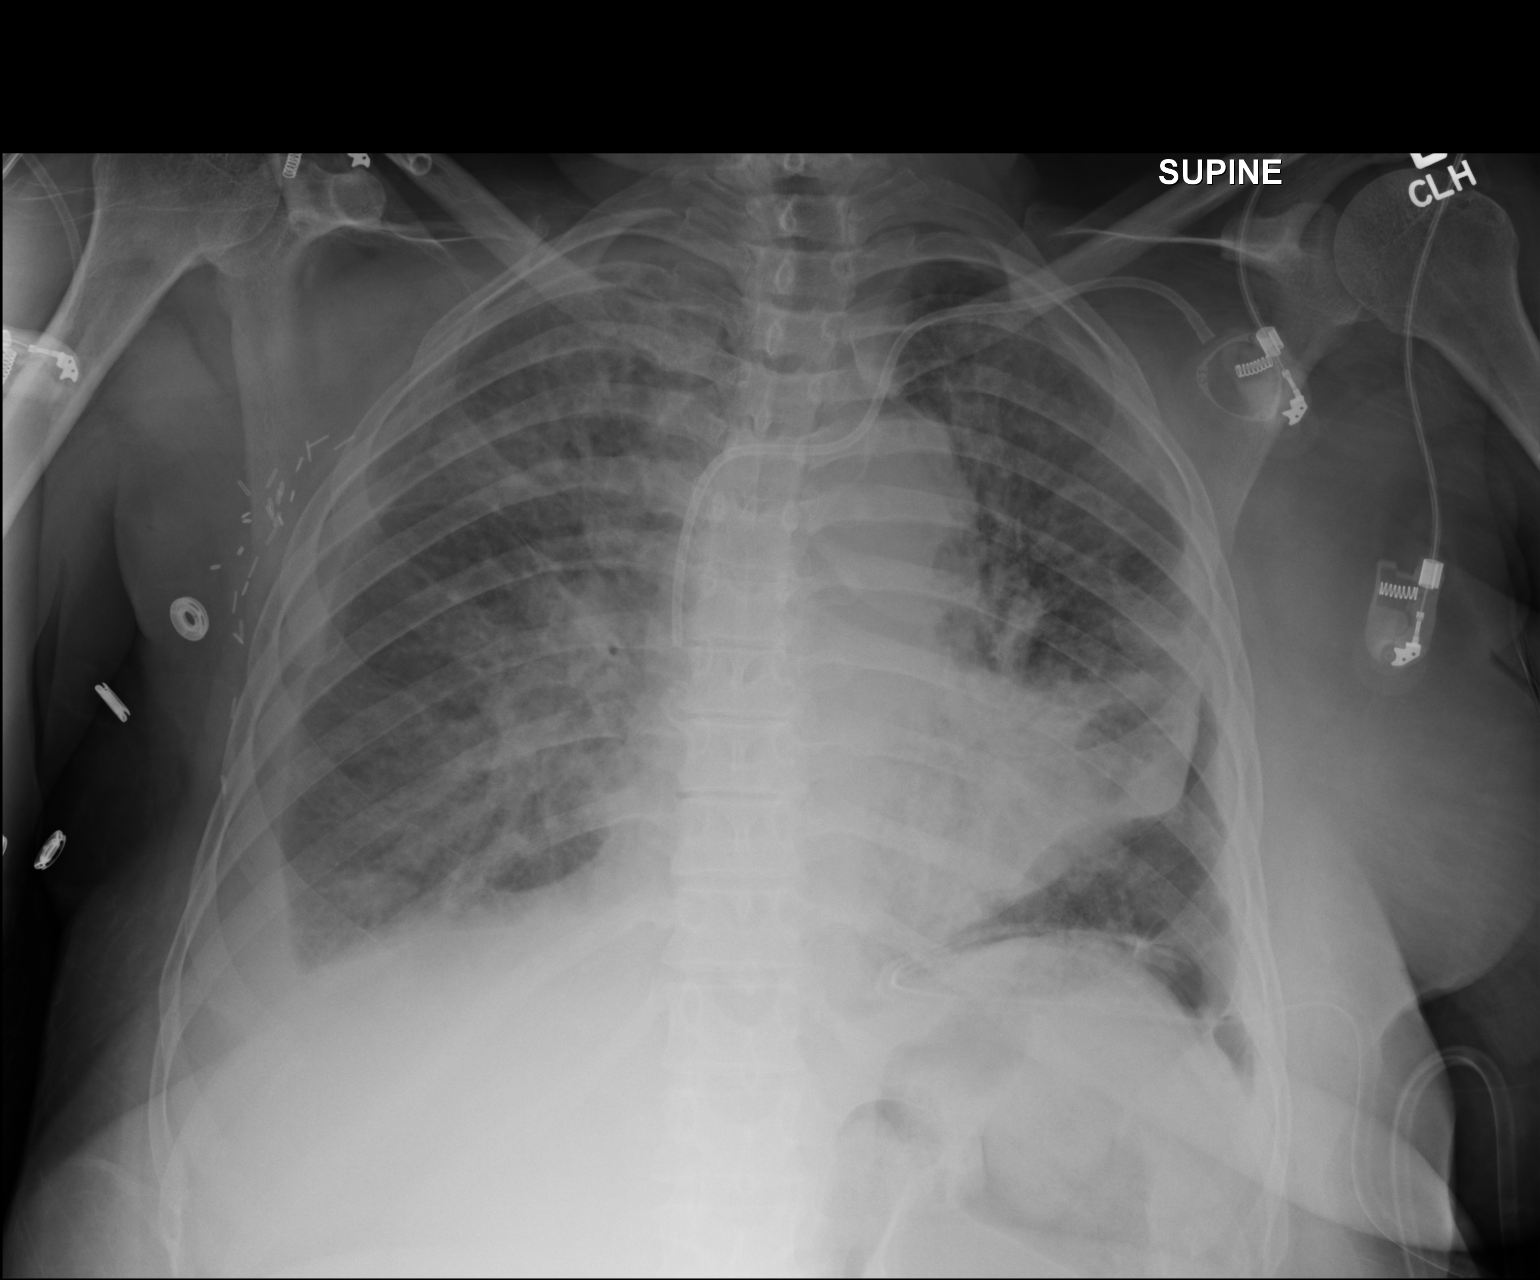

[1 of 1 positions shown; findings below may reference images not displayed]

FINDINGS: New left basilar tunneled pleural catheter which is coiled without
kink. There is left basilar pneumothorax, less than 25%, where
pleural fluid has been evacuated. Persistent dense opacity at the
left lung base, likely trapped collapsed lung based on CT from
[place]. Increase in right pleural effusion, approaching moderate
volume. Pulmonary venous congestion. Stable heart size and
mediastinal contours. Left-sided porta catheter, tip at the SVC
level. Surgical changes from breast cancer treatment.
IMPRESSION: 1. New tunneled pleural catheter with left basilar pneumothorax,
likely ex vacuo.
2. Increased right pleural effusion and pulmonary venous congestion.
